# Patient Record
Sex: Female | Born: 1955 | Race: White | Hispanic: No | Marital: Single | Smoking: Former smoker
Health system: Southern US, Community
[De-identification: ages and names within clinical notes are randomized; demographics above are authoritative.]

## PROBLEM LIST (undated history)

## (undated) DIAGNOSIS — I4891 Unspecified atrial fibrillation: Secondary | ICD-10-CM

## (undated) DIAGNOSIS — N200 Calculus of kidney: Secondary | ICD-10-CM

## (undated) DIAGNOSIS — F329 Major depressive disorder, single episode, unspecified: Secondary | ICD-10-CM

## (undated) DIAGNOSIS — G35 Multiple sclerosis: Secondary | ICD-10-CM

## (undated) DIAGNOSIS — J449 Chronic obstructive pulmonary disease, unspecified: Secondary | ICD-10-CM

## (undated) DIAGNOSIS — R319 Hematuria, unspecified: Secondary | ICD-10-CM

## (undated) DIAGNOSIS — C801 Malignant (primary) neoplasm, unspecified: Secondary | ICD-10-CM

## (undated) DIAGNOSIS — I2510 Atherosclerotic heart disease of native coronary artery without angina pectoris: Secondary | ICD-10-CM

## (undated) DIAGNOSIS — J439 Emphysema, unspecified: Secondary | ICD-10-CM

## (undated) DIAGNOSIS — F32A Depression, unspecified: Secondary | ICD-10-CM

## (undated) HISTORY — DX: Hematuria, unspecified: R31.9

## (undated) HISTORY — DX: Unspecified atrial fibrillation: I48.91

---

## 2002-12-19 ENCOUNTER — Emergency Department (HOSPITAL_COMMUNITY): Admission: EM | Admit: 2002-12-19 | Discharge: 2002-12-19 | Payer: Self-pay | Admitting: Emergency Medicine

## 2003-01-23 ENCOUNTER — Encounter: Payer: Self-pay | Admitting: Neurology

## 2003-01-23 ENCOUNTER — Ambulatory Visit (HOSPITAL_COMMUNITY): Admission: RE | Admit: 2003-01-23 | Discharge: 2003-01-23 | Payer: Self-pay | Admitting: Neurology

## 2003-03-05 ENCOUNTER — Ambulatory Visit (HOSPITAL_COMMUNITY): Admission: RE | Admit: 2003-03-05 | Discharge: 2003-03-05 | Payer: Self-pay | Admitting: Neurology

## 2003-04-01 ENCOUNTER — Ambulatory Visit (HOSPITAL_COMMUNITY): Admission: RE | Admit: 2003-04-01 | Discharge: 2003-04-01 | Payer: Self-pay | Admitting: Neurology

## 2009-05-28 ENCOUNTER — Ambulatory Visit: Payer: Self-pay | Admitting: Internal Medicine

## 2009-05-28 HISTORY — PX: TOTAL HIP ARTHROPLASTY: SHX124

## 2009-05-28 HISTORY — PX: OTHER SURGICAL HISTORY: SHX169

## 2009-06-16 ENCOUNTER — Inpatient Hospital Stay: Payer: Self-pay | Admitting: Internal Medicine

## 2009-06-21 ENCOUNTER — Ambulatory Visit: Payer: Self-pay | Admitting: Cardiology

## 2009-06-22 ENCOUNTER — Ambulatory Visit: Payer: Self-pay | Admitting: Cardiology

## 2009-06-28 ENCOUNTER — Ambulatory Visit: Payer: Self-pay | Admitting: Internal Medicine

## 2009-06-28 ENCOUNTER — Encounter: Payer: Self-pay | Admitting: Internal Medicine

## 2009-06-30 ENCOUNTER — Inpatient Hospital Stay: Payer: Self-pay | Admitting: Surgery

## 2009-07-21 ENCOUNTER — Inpatient Hospital Stay: Payer: Self-pay | Admitting: Surgery

## 2009-07-26 ENCOUNTER — Ambulatory Visit: Payer: Self-pay | Admitting: Internal Medicine

## 2009-08-06 ENCOUNTER — Emergency Department: Payer: Self-pay | Admitting: Emergency Medicine

## 2009-08-07 ENCOUNTER — Ambulatory Visit: Payer: Self-pay | Admitting: Family Medicine

## 2009-08-09 ENCOUNTER — Ambulatory Visit: Payer: Self-pay | Admitting: Specialist

## 2009-09-06 ENCOUNTER — Telehealth (INDEPENDENT_AMBULATORY_CARE_PROVIDER_SITE_OTHER): Payer: Self-pay | Admitting: *Deleted

## 2009-09-06 ENCOUNTER — Inpatient Hospital Stay (HOSPITAL_COMMUNITY): Admission: EM | Admit: 2009-09-06 | Discharge: 2009-09-15 | Payer: Self-pay | Admitting: Emergency Medicine

## 2009-09-07 ENCOUNTER — Ambulatory Visit: Payer: Self-pay | Admitting: Emergency Medicine

## 2009-09-13 ENCOUNTER — Ambulatory Visit: Payer: Self-pay | Admitting: Physical Medicine & Rehabilitation

## 2010-06-27 NOTE — Progress Notes (Signed)
Summary: Brother wants patient admitted by Dr. Marina Goodell  Phone Note Other Incoming   Summary of Call: Pt.s brother Evelyn Willis called requesting Dr.Perry admit pt. to [facility] as she has had 2 falls in the past 24 hrs and continues to lose wt.Records from Dr.Smith from [facility] and Dr.Elliot reviewed by Dr.Perry and  brother needs to contact PCP for her medical issues and DR.Markham Jordan for any further GI issues. Initial call taken by: Teryl Lucy RN,  September 06, 2009 9:58 AM  Follow-up for Phone Call        though I have seen her brother, I have no professional relationship with this patient. She has a multitude of general medical problems and needs to work with her primary provider in [place]. She also has a Customer service manager. I decline the opportunity toassume this patient's care or admit this patient to [facility], hospital. Follow-up by: Hilarie Fredrickson MD,  September 06, 2009 10:18 AM

## 2010-06-28 ENCOUNTER — Ambulatory Visit: Payer: Self-pay | Admitting: Orthopedic Surgery

## 2010-06-29 ENCOUNTER — Inpatient Hospital Stay: Payer: Self-pay | Admitting: Orthopedic Surgery

## 2010-08-15 LAB — CBC
HCT: 23.9 % — ABNORMAL LOW (ref 36.0–46.0)
HCT: 25.3 % — ABNORMAL LOW (ref 36.0–46.0)
HCT: 25.3 % — ABNORMAL LOW (ref 36.0–46.0)
Hemoglobin: 7.4 g/dL — ABNORMAL LOW (ref 12.0–15.0)
Hemoglobin: 7.8 g/dL — ABNORMAL LOW (ref 12.0–15.0)
Hemoglobin: 8 g/dL — ABNORMAL LOW (ref 12.0–15.0)
Hemoglobin: 8.3 g/dL — ABNORMAL LOW (ref 12.0–15.0)
Hemoglobin: 8.5 g/dL — ABNORMAL LOW (ref 12.0–15.0)
MCHC: 32.9 g/dL (ref 30.0–36.0)
MCHC: 33.6 g/dL (ref 30.0–36.0)
MCHC: 33.6 g/dL (ref 30.0–36.0)
MCV: 91.8 fL (ref 78.0–100.0)
MCV: 92.1 fL (ref 78.0–100.0)
MCV: 93.5 fL (ref 78.0–100.0)
MCV: 94.3 fL (ref 78.0–100.0)
Platelets: 131 10*3/uL — ABNORMAL LOW (ref 150–400)
Platelets: 191 10*3/uL (ref 150–400)
Platelets: 204 10*3/uL (ref 150–400)
RBC: 2.37 MIL/uL — ABNORMAL LOW (ref 3.87–5.11)
RBC: 2.68 MIL/uL — ABNORMAL LOW (ref 3.87–5.11)
RBC: 2.75 MIL/uL — ABNORMAL LOW (ref 3.87–5.11)
RDW: 22.2 % — ABNORMAL HIGH (ref 11.5–15.5)
RDW: 22.5 % — ABNORMAL HIGH (ref 11.5–15.5)
RDW: 23.2 % — ABNORMAL HIGH (ref 11.5–15.5)
WBC: 5.7 10*3/uL (ref 4.0–10.5)
WBC: 7.1 10*3/uL (ref 4.0–10.5)
WBC: 8.6 10*3/uL (ref 4.0–10.5)
WBC: 9 10*3/uL (ref 4.0–10.5)

## 2010-08-15 LAB — BASIC METABOLIC PANEL
BUN: 1 mg/dL — ABNORMAL LOW (ref 6–23)
BUN: 11 mg/dL (ref 6–23)
BUN: 9 mg/dL (ref 6–23)
CO2: 23 mEq/L (ref 19–32)
CO2: 27 mEq/L (ref 19–32)
CO2: 32 mEq/L (ref 19–32)
Calcium: 6.1 mg/dL — CL (ref 8.4–10.5)
Calcium: 6.3 mg/dL — CL (ref 8.4–10.5)
Calcium: 6.9 mg/dL — ABNORMAL LOW (ref 8.4–10.5)
Chloride: 104 mEq/L (ref 96–112)
Chloride: 107 mEq/L (ref 96–112)
Creatinine, Ser: 0.48 mg/dL (ref 0.4–1.2)
Creatinine, Ser: 0.5 mg/dL (ref 0.4–1.2)
Creatinine, Ser: 0.52 mg/dL (ref 0.4–1.2)
Creatinine, Ser: 0.55 mg/dL (ref 0.4–1.2)
GFR calc Af Amer: >60 mL/min (ref >60)
GFR calc Af Amer: >60 mL/min (ref >60)
GFR calc non Af Amer: >60 mL/min (ref >60)
GFR calc non Af Amer: >60 mL/min (ref >60)
GFR calc non Af Amer: >60 mL/min (ref >60)
Glucose, Bld: 105 mg/dL — ABNORMAL HIGH (ref 70–99)
Glucose, Bld: 164 mg/dL — ABNORMAL HIGH (ref 70–99)
Glucose, Bld: 75 mg/dL (ref 70–99)
Potassium: 2.8 mEq/L — ABNORMAL LOW (ref 3.5–5.1)
Potassium: 4.3 mEq/L (ref 3.5–5.1)
Sodium: 136 mEq/L (ref 135–145)
Sodium: 139 mEq/L (ref 135–145)
Sodium: 141 mEq/L (ref 135–145)

## 2010-08-15 LAB — GLUCOSE, CAPILLARY
Glucose-Capillary: 101 mg/dL — ABNORMAL HIGH (ref 70–99)
Glucose-Capillary: 106 mg/dL — ABNORMAL HIGH (ref 70–99)
Glucose-Capillary: 106 mg/dL — ABNORMAL HIGH (ref 70–99)
Glucose-Capillary: 106 mg/dL — ABNORMAL HIGH (ref 70–99)
Glucose-Capillary: 112 mg/dL — ABNORMAL HIGH (ref 70–99)
Glucose-Capillary: 123 mg/dL — ABNORMAL HIGH (ref 70–99)
Glucose-Capillary: 127 mg/dL — ABNORMAL HIGH (ref 70–99)
Glucose-Capillary: 128 mg/dL — ABNORMAL HIGH (ref 70–99)
Glucose-Capillary: 145 mg/dL — ABNORMAL HIGH (ref 70–99)
Glucose-Capillary: 145 mg/dL — ABNORMAL HIGH (ref 70–99)
Glucose-Capillary: 155 mg/dL — ABNORMAL HIGH (ref 70–99)
Glucose-Capillary: 65 mg/dL — ABNORMAL LOW (ref 70–99)
Glucose-Capillary: 67 mg/dL — ABNORMAL LOW (ref 70–99)
Glucose-Capillary: 71 mg/dL (ref 70–99)
Glucose-Capillary: 73 mg/dL (ref 70–99)
Glucose-Capillary: 73 mg/dL (ref 70–99)
Glucose-Capillary: 76 mg/dL (ref 70–99)
Glucose-Capillary: 76 mg/dL (ref 70–99)
Glucose-Capillary: 77 mg/dL (ref 70–99)
Glucose-Capillary: 78 mg/dL (ref 70–99)
Glucose-Capillary: 84 mg/dL (ref 70–99)
Glucose-Capillary: 92 mg/dL (ref 70–99)

## 2010-08-15 LAB — POCT I-STAT 3, ART BLOOD GAS (G3+)
Acid-Base Excess: 3 mmol/L — ABNORMAL HIGH (ref 0.0–2.0)
Acid-Base Excess: 3 mmol/L — ABNORMAL HIGH (ref 0.0–2.0)
Bicarbonate: 24.5 mEq/L — ABNORMAL HIGH (ref 20.0–24.0)
Bicarbonate: 24.6 mEq/L — ABNORMAL HIGH (ref 20.0–24.0)
O2 Saturation: 99 %
O2 Saturation: 99 %
Patient temperature: 96.8
Patient temperature: 97
TCO2: 25 mmol/L (ref 0–100)
TCO2: 25 mmol/L (ref 0–100)
pCO2 arterial: 26.6 mmHg — ABNORMAL LOW (ref 35.0–45.0)
pCO2 arterial: 27 mmHg — ABNORMAL LOW (ref 35.0–45.0)
pH, Arterial: 7.563 — ABNORMAL HIGH (ref 7.350–7.400)
pH, Arterial: 7.572 — ABNORMAL HIGH (ref 7.350–7.400)
pO2, Arterial: 102 mmHg — ABNORMAL HIGH (ref 80.0–100.0)
pO2, Arterial: 111 mmHg — ABNORMAL HIGH (ref 80.0–100.0)

## 2010-08-15 LAB — MAGNESIUM
Magnesium: 1.6 mg/dL (ref 1.5–2.5)
Magnesium: 2 mg/dL (ref 1.5–2.5)
Magnesium: 2.3 mg/dL (ref 1.5–2.5)

## 2010-08-15 LAB — DIFFERENTIAL
Basophils Relative: 0 % (ref 0–1)
Eosinophils Relative: 0 % (ref 0–5)
Lymphocytes Relative: 12 % (ref 12–46)
Neutro Abs: 4.7 10*3/uL (ref 1.7–7.7)
Neutrophils Relative %: 82 % — ABNORMAL HIGH (ref 43–77)

## 2010-08-15 LAB — URINALYSIS, ROUTINE W REFLEX MICROSCOPIC
Hgb urine dipstick: NEGATIVE
Nitrite: NEGATIVE
Protein, ur: NEGATIVE mg/dL
Specific Gravity, Urine: 1.017 (ref 1.005–1.030)
Urobilinogen, UA: 1 mg/dL (ref 0.0–1.0)

## 2010-08-15 LAB — COMPREHENSIVE METABOLIC PANEL
BUN: 7 mg/dL (ref 6–23)
CO2: 30 mEq/L (ref 19–32)
Calcium: 7.3 mg/dL — ABNORMAL LOW (ref 8.4–10.5)
Chloride: 100 mEq/L (ref 96–112)
Creatinine, Ser: 0.54 mg/dL (ref 0.4–1.2)
GFR calc Af Amer: >60 mL/min (ref >60)
GFR calc non Af Amer: >60 mL/min (ref >60)
Glucose, Bld: 74 mg/dL (ref 70–99)
Total Bilirubin: 0.5 mg/dL (ref 0.3–1.2)

## 2010-08-15 LAB — URINE CULTURE

## 2010-08-15 LAB — CALCIUM, IONIZED: Calcium, Ion: 0.97 mmol/L — ABNORMAL LOW (ref 1.12–1.32)

## 2010-08-15 LAB — STREP PNEUMONIAE URINARY ANTIGEN: Strep Pneumo Urinary Antigen: NEGATIVE

## 2010-08-15 LAB — LEGIONELLA ANTIGEN, URINE

## 2010-08-15 LAB — PHOSPHORUS
Phosphorus: 2 mg/dL — ABNORMAL LOW (ref 2.3–4.6)
Phosphorus: 2.6 mg/dL (ref 2.3–4.6)

## 2010-08-16 LAB — URINALYSIS, ROUTINE W REFLEX MICROSCOPIC
Glucose, UA: NEGATIVE mg/dL
Hgb urine dipstick: NEGATIVE
Specific Gravity, Urine: 1.025 (ref 1.005–1.030)
pH: 5.5 (ref 5.0–8.0)

## 2010-08-16 LAB — POCT I-STAT 3, ART BLOOD GAS (G3+)
Acid-base deficit: 2 mmol/L (ref 0.0–2.0)
Acid-base deficit: 6 mmol/L — ABNORMAL HIGH (ref 0.0–2.0)
Acid-base deficit: 9 mmol/L — ABNORMAL HIGH (ref 0.0–2.0)
Bicarbonate: 13.8 mEq/L — ABNORMAL LOW (ref 20.0–24.0)
Bicarbonate: 13.9 mEq/L — ABNORMAL LOW (ref 20.0–24.0)
O2 Saturation: 100 %
O2 Saturation: 98 %
O2 Saturation: 99 %
Patient temperature: 96
Patient temperature: 98.6
Patient temperature: 99.6
TCO2: 15 mmol/L (ref 0–100)
TCO2: 18 mmol/L (ref 0–100)
pCO2 arterial: 21 mmHg — ABNORMAL LOW (ref 35.0–45.0)
pCO2 arterial: 22.3 mmHg — ABNORMAL LOW (ref 35.0–45.0)
pCO2 arterial: 23.3 mmHg — ABNORMAL LOW (ref 35.0–45.0)
pCO2 arterial: 38.8 mmHg (ref 35.0–45.0)
pH, Arterial: 7.241 — ABNORMAL LOW (ref 7.350–7.400)
pH, Arterial: 7.386 (ref 7.350–7.400)
pH, Arterial: 7.387 (ref 7.350–7.400)
pH, Arterial: 7.426 — ABNORMAL HIGH (ref 7.350–7.400)
pO2, Arterial: 115 mmHg — ABNORMAL HIGH (ref 80.0–100.0)
pO2, Arterial: 375 mmHg — ABNORMAL HIGH (ref 80.0–100.0)
pO2, Arterial: 50 mmHg — ABNORMAL LOW (ref 80.0–100.0)
pO2, Arterial: 51 mmHg — ABNORMAL LOW (ref 80.0–100.0)
pO2, Arterial: 83 mmHg (ref 80.0–100.0)

## 2010-08-16 LAB — BASIC METABOLIC PANEL
BUN: 3 mg/dL — ABNORMAL LOW (ref 6–23)
CO2: 16 mEq/L — ABNORMAL LOW (ref 19–32)
CO2: 20 mEq/L (ref 19–32)
Calcium: 6 mg/dL — CL (ref 8.4–10.5)
Chloride: 113 mEq/L — ABNORMAL HIGH (ref 96–112)
Creatinine, Ser: 0.57 mg/dL (ref 0.4–1.2)
GFR calc Af Amer: >60 mL/min (ref >60)
GFR calc non Af Amer: >60 mL/min (ref >60)
Glucose, Bld: 204 mg/dL — ABNORMAL HIGH (ref 70–99)
Sodium: 140 mEq/L (ref 135–145)

## 2010-08-16 LAB — CARBOXYHEMOGLOBIN
Methemoglobin: 0.4 % (ref 0.0–1.5)
Methemoglobin: 1 % (ref 0.0–1.5)
O2 Saturation: 58 %
O2 Saturation: 81.1 %
Total hemoglobin: 7.5 g/dL — ABNORMAL LOW (ref 12.5–16.0)
Total hemoglobin: 8.1 g/dL — ABNORMAL LOW (ref 12.5–16.0)

## 2010-08-16 LAB — COMPREHENSIVE METABOLIC PANEL
ALT: 13 U/L (ref 0–35)
ALT: 15 U/L (ref 0–35)
AST: 18 U/L (ref 0–37)
AST: 20 U/L (ref 0–37)
Alkaline Phosphatase: 120 U/L — ABNORMAL HIGH (ref 39–117)
CO2: 16 mEq/L — ABNORMAL LOW (ref 19–32)
CO2: 17 mEq/L — ABNORMAL LOW (ref 19–32)
CO2: 20 mEq/L (ref 19–32)
Calcium: 5.8 mg/dL — CL (ref 8.4–10.5)
Calcium: 6.2 mg/dL — CL (ref 8.4–10.5)
Calcium: 7.9 mg/dL — ABNORMAL LOW (ref 8.4–10.5)
Chloride: 101 mEq/L (ref 96–112)
Chloride: 114 mEq/L — ABNORMAL HIGH (ref 96–112)
Creatinine, Ser: 0.43 mg/dL (ref 0.4–1.2)
Creatinine, Ser: 0.61 mg/dL (ref 0.4–1.2)
GFR calc Af Amer: >60 mL/min (ref >60)
GFR calc non Af Amer: >60 mL/min (ref >60)
GFR calc non Af Amer: >60 mL/min (ref >60)
GFR calc non Af Amer: >60 mL/min (ref >60)
Glucose, Bld: 66 mg/dL — ABNORMAL LOW (ref 70–99)
Glucose, Bld: 79 mg/dL (ref 70–99)
Potassium: 3.2 mEq/L — ABNORMAL LOW (ref 3.5–5.1)
Sodium: 135 mEq/L (ref 135–145)
Total Bilirubin: 1.3 mg/dL — ABNORMAL HIGH (ref 0.3–1.2)
Total Bilirubin: 1.7 mg/dL — ABNORMAL HIGH (ref 0.3–1.2)
Total Protein: 3.9 g/dL — ABNORMAL LOW (ref 6.0–8.3)

## 2010-08-16 LAB — CBC
Hemoglobin: 7.5 g/dL — ABNORMAL LOW (ref 12.0–15.0)
Hemoglobin: 9.4 g/dL — ABNORMAL LOW (ref 12.0–15.0)
MCHC: 32.8 g/dL (ref 30.0–36.0)
MCHC: 33 g/dL (ref 30.0–36.0)
MCV: 91.3 fL (ref 78.0–100.0)
MCV: 92.4 fL (ref 78.0–100.0)
MCV: 92.4 fL (ref 78.0–100.0)
Platelets: 215 10*3/uL (ref 150–400)
RBC: 2.51 MIL/uL — ABNORMAL LOW (ref 3.87–5.11)
RBC: 3.07 MIL/uL — ABNORMAL LOW (ref 3.87–5.11)
RDW: 23.2 % — ABNORMAL HIGH (ref 11.5–15.5)
WBC: 6.4 10*3/uL (ref 4.0–10.5)
WBC: 8.1 10*3/uL (ref 4.0–10.5)

## 2010-08-16 LAB — CULTURE, BLOOD (ROUTINE X 2): Culture: NO GROWTH

## 2010-08-16 LAB — PHOSPHORUS
Phosphorus: 2 mg/dL — ABNORMAL LOW (ref 2.3–4.6)
Phosphorus: 2.2 mg/dL — ABNORMAL LOW (ref 2.3–4.6)

## 2010-08-16 LAB — CULTURE, BAL-QUANTITATIVE W GRAM STAIN
Colony Count: NO GROWTH
Culture: NO GROWTH

## 2010-08-16 LAB — ABO/RH: ABO/RH(D): O POS

## 2010-08-16 LAB — TYPE AND SCREEN: ABO/RH(D): O POS

## 2010-08-16 LAB — PROTIME-INR: Prothrombin Time: 16 seconds — ABNORMAL HIGH (ref 11.6–15.2)

## 2010-08-16 LAB — CARDIAC PANEL(CRET KIN+CKTOT+MB+TROPI)
CK, MB: 2.1 ng/mL (ref 0.3–4.0)
Total CK: 20 U/L (ref 7–177)
Troponin I: 0.06 ng/mL (ref 0.00–0.06)
Troponin I: 0.06 ng/mL (ref 0.00–0.06)

## 2010-08-16 LAB — IRON AND TIBC

## 2010-08-16 LAB — DIFFERENTIAL
Basophils Absolute: 0 10*3/uL (ref 0.0–0.1)
Basophils Absolute: 0 10*3/uL (ref 0.0–0.1)
Eosinophils Absolute: 0 10*3/uL (ref 0.0–0.7)
Eosinophils Relative: 0 % (ref 0–5)
Lymphocytes Relative: 10 % — ABNORMAL LOW (ref 12–46)
Lymphs Abs: 0.6 10*3/uL — ABNORMAL LOW (ref 0.7–4.0)
Lymphs Abs: 1.2 10*3/uL (ref 0.7–4.0)
Monocytes Absolute: 0.8 10*3/uL (ref 0.1–1.0)
Monocytes Relative: 10 % (ref 3–12)
Monocytes Relative: 10 % (ref 3–12)
Neutrophils Relative %: 75 % (ref 43–77)

## 2010-08-16 LAB — MAGNESIUM: Magnesium: 1.4 mg/dL — ABNORMAL LOW (ref 1.5–2.5)

## 2010-08-16 LAB — GLUCOSE, CAPILLARY
Glucose-Capillary: 130 mg/dL — ABNORMAL HIGH (ref 70–99)
Glucose-Capillary: 86 mg/dL (ref 70–99)
Glucose-Capillary: 97 mg/dL (ref 70–99)

## 2010-08-16 LAB — FERRITIN: Ferritin: 618 ng/mL — ABNORMAL HIGH (ref 10–291)

## 2010-08-16 LAB — CK TOTAL AND CKMB (NOT AT ARMC): CK, MB: 1.8 ng/mL (ref 0.3–4.0)

## 2010-08-16 LAB — PREALBUMIN: Prealbumin: 3.4 mg/dL — ABNORMAL LOW (ref 18.0–45.0)

## 2010-08-16 LAB — LIPASE, BLOOD: Lipase: 12 U/L (ref 11–59)

## 2010-08-16 LAB — TSH: TSH: 0.216 u[IU]/mL — ABNORMAL LOW (ref 0.350–4.500)

## 2010-08-16 LAB — MRSA PCR SCREENING: MRSA by PCR: POSITIVE — AB

## 2010-08-16 LAB — RETICULOCYTES: Retic Ct Pct: 1.7 % (ref 0.4–3.1)

## 2010-08-16 LAB — FOLATE: Folate: 5.1 ng/mL

## 2010-10-25 ENCOUNTER — Ambulatory Visit: Payer: Self-pay | Admitting: Orthopedic Surgery

## 2012-09-12 DIAGNOSIS — G8929 Other chronic pain: Secondary | ICD-10-CM | POA: Insufficient documentation

## 2013-04-29 DIAGNOSIS — G894 Chronic pain syndrome: Secondary | ICD-10-CM | POA: Insufficient documentation

## 2013-04-29 DIAGNOSIS — M2550 Pain in unspecified joint: Secondary | ICD-10-CM | POA: Insufficient documentation

## 2013-04-29 DIAGNOSIS — Z79899 Other long term (current) drug therapy: Secondary | ICD-10-CM | POA: Insufficient documentation

## 2014-04-27 DIAGNOSIS — G4700 Insomnia, unspecified: Secondary | ICD-10-CM | POA: Insufficient documentation

## 2014-04-27 DIAGNOSIS — R5382 Chronic fatigue, unspecified: Secondary | ICD-10-CM | POA: Insufficient documentation

## 2014-05-28 DIAGNOSIS — C801 Malignant (primary) neoplasm, unspecified: Secondary | ICD-10-CM

## 2014-05-28 HISTORY — DX: Malignant (primary) neoplasm, unspecified: C80.1

## 2014-12-21 ENCOUNTER — Emergency Department
Admission: EM | Admit: 2014-12-21 | Discharge: 2014-12-21 | Discharge status: Against Medical Advice | Payer: PPO | Attending: Emergency Medicine | Admitting: Emergency Medicine

## 2014-12-21 ENCOUNTER — Emergency Department: Payer: PPO

## 2014-12-21 DIAGNOSIS — R05 Cough: Secondary | ICD-10-CM | POA: Insufficient documentation

## 2014-12-21 LAB — BASIC METABOLIC PANEL
ANION GAP: 10 (ref 5–15)
BUN: 18 mg/dL (ref 6–20)
CHLORIDE: 105 mmol/L (ref 101–111)
CO2: 24 mmol/L (ref 22–32)
CREATININE: 0.6 mg/dL (ref 0.44–1.00)
Calcium: 8.9 mg/dL (ref 8.9–10.3)
GFR calc Af Amer: >60 mL/min (ref >60)
Glucose, Bld: 121 mg/dL — ABNORMAL HIGH (ref 65–99)
Potassium: 4.6 mmol/L (ref 3.5–5.1)
Sodium: 139 mmol/L (ref 135–145)

## 2014-12-21 LAB — CBC
HCT: 42.8 % (ref 35.0–47.0)
HEMOGLOBIN: 14.3 g/dL (ref 12.0–16.0)
MCH: 32.1 pg (ref 26.0–34.0)
MCHC: 33.4 g/dL (ref 32.0–36.0)
MCV: 96.2 fL (ref 80.0–100.0)
Platelets: 237 10*3/uL (ref 150–440)
RBC: 4.45 MIL/uL (ref 3.80–5.20)
RDW: 15.4 % — AB (ref 11.5–14.5)
WBC: 3.6 10*3/uL (ref 3.6–11.0)

## 2014-12-21 NOTE — ED Notes (Addendum)
Patient ambulatory to triage with steady gait, without difficulty or distress noted; pt reports episode of hemoptysis tonight with no accomp symptoms; pt currently receiving chemo & radiation for lung CA; mask placed on pt

## 2015-05-19 ENCOUNTER — Emergency Department: Payer: PPO

## 2015-05-19 ENCOUNTER — Emergency Department
Admission: EM | Admit: 2015-05-19 | Discharge: 2015-05-19 | Disposition: A | Discharge status: Home / Self Care | Payer: PPO | Attending: Emergency Medicine | Admitting: Emergency Medicine

## 2015-05-19 DIAGNOSIS — R079 Chest pain, unspecified: Secondary | ICD-10-CM | POA: Insufficient documentation

## 2015-05-19 DIAGNOSIS — R9389 Abnormal findings on diagnostic imaging of other specified body structures: Secondary | ICD-10-CM

## 2015-05-19 DIAGNOSIS — R531 Weakness: Secondary | ICD-10-CM | POA: Insufficient documentation

## 2015-05-19 DIAGNOSIS — R918 Other nonspecific abnormal finding of lung field: Secondary | ICD-10-CM | POA: Diagnosis not present

## 2015-05-19 DIAGNOSIS — R0602 Shortness of breath: Secondary | ICD-10-CM | POA: Insufficient documentation

## 2015-05-19 DIAGNOSIS — C3491 Malignant neoplasm of unspecified part of right bronchus or lung: Secondary | ICD-10-CM | POA: Insufficient documentation

## 2015-05-19 DIAGNOSIS — F17200 Nicotine dependence, unspecified, uncomplicated: Secondary | ICD-10-CM | POA: Diagnosis not present

## 2015-05-19 HISTORY — DX: Malignant (primary) neoplasm, unspecified: C80.1

## 2015-05-19 LAB — CBC
HCT: 41.5 % (ref 35.0–47.0)
Hemoglobin: 14 g/dL (ref 12.0–16.0)
MCH: 34.6 pg — AB (ref 26.0–34.0)
MCHC: 33.7 g/dL (ref 32.0–36.0)
MCV: 102.6 fL — AB (ref 80.0–100.0)
PLATELETS: 303 10*3/uL (ref 150–440)
RBC: 4.05 MIL/uL (ref 3.80–5.20)
RDW: 12.1 % (ref 11.5–14.5)
WBC: 9.8 10*3/uL (ref 3.6–11.0)

## 2015-05-19 LAB — TROPONIN I
Troponin I: <0.03 ng/mL (ref <0.031)
Troponin I: <0.03 ng/mL (ref <0.031)

## 2015-05-19 LAB — BASIC METABOLIC PANEL
Anion gap: 8 (ref 5–15)
BUN: 17 mg/dL (ref 6–20)
CHLORIDE: 100 mmol/L — AB (ref 101–111)
CO2: 28 mmol/L (ref 22–32)
CREATININE: 0.7 mg/dL (ref 0.44–1.00)
Calcium: 9 mg/dL (ref 8.9–10.3)
GFR calc Af Amer: >60 mL/min (ref >60)
GFR calc non Af Amer: >60 mL/min (ref >60)
Glucose, Bld: 112 mg/dL — ABNORMAL HIGH (ref 65–99)
Potassium: 4.7 mmol/L (ref 3.5–5.1)
SODIUM: 136 mmol/L (ref 135–145)

## 2015-05-19 MED ORDER — AMOXICILLIN-POT CLAVULANATE 875-125 MG PO TABS
1.0000 | ORAL_TABLET | Freq: Once | ORAL | Status: AC
  Administered 2015-05-19: 1 via ORAL
  Filled 2015-05-19: qty 1

## 2015-05-19 MED ORDER — AMOXICILLIN-POT CLAVULANATE 875-125 MG PO TABS: 1.0000 | ORAL_TABLET | Freq: Two times a day (BID) | ORAL | Status: DC

## 2015-05-19 NOTE — Discharge Instructions (Signed)
It is not clear what caused your chest pain and episode earlier today. Your looking well the emergency department. Your first cardiac enzyme was negative. We discussed your chest x-ray and viewed it together. We agreed that repeating her CT scan was not appropriate today. We will treat her with antibiotics in case the area in question on the chest x-ray is pneumonia as opposed to scarring. Follow-up with your primary physician. Return to the emergency department if you have more chest pain or shortness of breath or other urgent concerns.  Nonspecific Chest Pain It is often hard to find the cause of chest pain. There is always a chance that your pain could be related to something serious, such as a heart attack or a blood clot in your lungs. Chest pain can also be caused by conditions that are not life-threatening. If you have chest pain, it is very important to follow up with your doctor.  HOME CARE  If you were prescribed an antibiotic medicine, finish it all even if you start to feel better.  Avoid any activities that cause chest pain.  Do not use any tobacco products, including cigarettes, chewing tobacco, or electronic cigarettes. If you need help quitting, ask your doctor.  Do not drink alcohol.  Take medicines only as told by your doctor.  Keep all follow-up visits as told by your doctor. This is important. This includes any further testing if your chest pain does not go away.  Your doctor may tell you to keep your head raised (elevated) while you sleep.  Make lifestyle changes as told by your doctor. These may include:  Getting regular exercise. Ask your doctor to suggest some activities that are safe for you.  Eating a heart-healthy diet. Your doctor or a diet specialist (dietitian) can help you to learn healthy eating options.  Maintaining a healthy weight.  Managing diabetes, if necessary.  Reducing stress. GET HELP IF:  Your chest pain does not go away, even after  treatment.  You have a rash with blisters on your chest.  You have a fever. GET HELP RIGHT AWAY IF:  Your chest pain is worse.  You have an increasing cough, or you cough up blood.  You have severe belly (abdominal) pain.  You feel extremely weak.  You pass out (faint).  You have chills.  You have sudden, unexplained chest discomfort.  You have sudden, unexplained discomfort in your arms, back, neck, or jaw.  You have shortness of breath at any time.  You suddenly start to sweat, or your skin gets clammy.  You feel nauseous.  You vomit.  You suddenly feel light-headed or dizzy.  Your heart begins to beat quickly, or it feels like it is skipping beats. These symptoms may be an emergency. Do not wait to see if the symptoms will go away. Get medical help right away. Call your local emergency services (911 in the [place] Do not drive yourself to the hospital.   This information is not intended to replace advice given to you by your health care provider. Make sure you discuss any questions you have with your health care provider.   Document Released: 10/31/2007 Document Revised: 06/04/2014 Document Reviewed: 12/18/2013 Elsevier Interactive Patient Education [employer].

## 2015-05-19 NOTE — ED Notes (Signed)
CP intermittently X 2 days. Pain earlier today. Pt alert and oriented X4, active, cooperative, pt in NAD. RR even and unlabored, color WNL.

## 2015-05-19 NOTE — ED Provider Notes (Addendum)
[facility] Emergency Department Provider Note  ____________________________________________  Time seen: 1353  I have reviewed the triage vital signs and the nursing notes.  History by:    HISTORY  Chief Complaint Chest Pain     HPI Evelyn Willis is a 59 y.o. female who was diagnosed with a lung cancer earlier this year. She was treated at [facility] with chemotherapy. She had a follow-up CT scan on November 30 and was cleared. She has been doing well until today when she had an episode of chest pain, shortness of breath, and feeling a little weak. Apparently she turned pale at this episode. This episode began at 12:00 and lasted approximately 10 minutes. She came to the emergency department. She is feeling better at this time. She is concerned about recurrence of cancer versus pneumonia. She denies any fever. She is not currently having any significant shortness of breath.    Past Medical History  Diagnosis Date  . Cancer ([place])     There are no active problems to display for this patient.   History reviewed. No pertinent past surgical history.  Current Outpatient Rx  Name  Route  Sig  Dispense  Refill  . amoxicillin-clavulanate (AUGMENTIN) 875-125 MG tablet   Oral   Take 1 tablet by mouth 2 (two) times daily.   15 tablet   0     Allergies Review of patient's allergies indicates no known allergies.  No family history on file.  Social History Social History  Substance Use Topics  . Smoking status: Current Every Day Smoker  . Smokeless tobacco: None  . Alcohol Use: Yes    Review of Systems  Constitutional: Negative for fever/chills. ENT: Negative for congestion. Cardiovascular: Chest pain. See history of present illness Respiratory: Shortness of breath. Recent diagnosis and treatment for lung cancer.. Gastrointestinal: Negative for abdominal pain, vomiting and diarrhea. Genitourinary: Negative for  dysuria. Musculoskeletal: No back pain. Skin: Negative for rash. Neurological: Negative for headache or focal weakness   10-point ROS otherwise negative.  ____________________________________________   PHYSICAL EXAM:  VITAL SIGNS: ED Triage Vitals  Enc Vitals Group     BP 05/19/15 1232 125/75 mmHg     Pulse Rate 05/19/15 1232 94     Resp 05/19/15 1232 18     Temp 05/19/15 1232 98.7 F (37.1 C)     Temp Source 05/19/15 1232 Oral     SpO2 05/19/15 1232 93 %     Weight 05/19/15 1226 128 lb (58.06 kg)     Height 05/19/15 1226 '5\' 7"'$  (1.702 m)     Head Cir --      Peak Flow --      Pain Score 05/19/15 1226 2     Pain Loc --      Pain Edu? --      Excl. in [place]? --     Constitutional:  Alert and oriented. Well appearing and in no distress. ENT   Head: Normocephalic and atraumatic.   Nose: No congestion/rhinnorhea.       Mouth: No erythema, no swelling   Cardiovascular: Normal rate, regular rhythm, no murmur noted Respiratory:  Normal respiratory effort, no tachypnea.    Breath sounds are clear and equal bilaterally.  Gastrointestinal: Soft, no distention. Nontender Back: No muscle spasm, no tenderness, no CVA tenderness. Musculoskeletal: No deformity noted. Nontender with normal range of motion in all extremities.  No noted edema. Neurologic:  Communicative. Normal appearing spontaneous movement in all 4 extremities.  No gross focal neurologic deficits are appreciated.  Skin:  Skin is warm, dry. No rash noted. Psychiatric: Mood and affect are normal. Speech and behavior are normal.  ____________________________________________    LABS (pertinent positives/negatives)  Labs Reviewed  BASIC METABOLIC PANEL - Abnormal; Notable for the following:    Chloride 100 (*)    Glucose, Bld 112 (*)    All other components within normal limits  CBC - Abnormal; Notable for the following:    MCV 102.6 (*)    MCH 34.6 (*)    All other components within normal limits  TROPONIN I   TROPONIN I     ____________________________________________   EKG  ED ECG REPORT I, Haruko Mersch W, the attending physician, personally viewed and interpreted this ECG.   Date: 05/19/2015  EKG Time: 12:30  Rate: 102  Rhythm: Sinus tachycardia  Axis: Normal  Intervals: Normal  ST&T Change: Somewhat tall T waves in 2, 3, aVF and V4 through V6   ____________________________________________    RADIOLOGY  Chest x-ray: IMPRESSION: Left lower lobe airspace disease worrisome for pneumonia. Recommend followup to clearing.  Emphysema.  Atherosclerosis.  ____________________________________________   PROCEDURES    ____________________________________________   INITIAL IMPRESSION / ASSESSMENT AND PLAN / ED COURSE  Pertinent labs & imaging results that were available during my care of the patient were reviewed by me and considered in my medical decision making (see chart for details).  Alert well-appearing 59-year-old female in no acute distress. She is understandably anxious because of her recent history and her episode today, but she does appear better and feels comfortable at this time.  I have pulled up her x-ray and shown it to her and discussed the area in the left lower lobe that is questionable. I have also pulled upper CT report from April 27 2015, which showed no worrisome neoplastic process.  My suspicion is that the patient has some scarring that still shows up on chest x-ray and she does not have pneumonia or current neoplasm. The patient and I discussed getting another CT, but given the fact that she just had one 3 weeks ago and that a CT this day would be unlikely to alter her clinical path, she has agreed that one is not indicated. We will place her on antibiotics. She is concerned because the chest pain she had. Because of this chest pain, we will hold her in the emergency department for serial enzymes. A second troponin will be drawn. I expect it to be  normal. If it is, we'll discharge her home to follow-up with her primary physician.  I'll last my colleague, Dr. Kerman Passey, to follow-up on the troponin and release the patient if it is negative.  ____________________________________________   FINAL CLINICAL IMPRESSION(S) / ED DIAGNOSES  Final diagnoses:  Chest pain, unspecified chest pain type  Abnormal chest x-ray     Ahmed Prima, MD 05/19/15 1424

## 2015-05-19 NOTE — ED Provider Notes (Signed)
-----------------------------------------   4:27 PM on 05/19/2015 -----------------------------------------  Second troponin is negative. We'll discharge the patient home per Dr. Ronni Rumble discharge instructions and prescriptions.  Harvest Dark, MD 05/19/15 [id number]

## 2015-05-31 DIAGNOSIS — Z0289 Encounter for other administrative examinations: Secondary | ICD-10-CM | POA: Insufficient documentation

## 2015-07-19 ENCOUNTER — Other Ambulatory Visit: Payer: Self-pay | Admitting: Family Medicine

## 2015-07-19 DIAGNOSIS — J449 Chronic obstructive pulmonary disease, unspecified: Secondary | ICD-10-CM | POA: Insufficient documentation

## 2015-07-19 DIAGNOSIS — Z1231 Encounter for screening mammogram for malignant neoplasm of breast: Secondary | ICD-10-CM

## 2015-07-27 ENCOUNTER — Ambulatory Visit
Admission: RE | Admit: 2015-07-27 | Discharge: 2015-07-27 | Disposition: A | Discharge status: Home / Self Care | Payer: PPO | Source: Ambulatory Visit | Attending: Family Medicine | Admitting: Family Medicine

## 2015-07-27 ENCOUNTER — Other Ambulatory Visit: Payer: Self-pay | Admitting: Family Medicine

## 2015-07-27 DIAGNOSIS — Z1231 Encounter for screening mammogram for malignant neoplasm of breast: Secondary | ICD-10-CM

## 2015-08-06 ENCOUNTER — Emergency Department: Payer: PPO

## 2015-08-06 ENCOUNTER — Inpatient Hospital Stay
Admission: EM | Admit: 2015-08-06 | Discharge: 2015-08-07 | DRG: 310 | Disposition: A | Discharge status: Home / Self Care | Payer: PPO | Attending: Internal Medicine | Admitting: Internal Medicine

## 2015-08-06 ENCOUNTER — Encounter: Payer: Self-pay | Admitting: Urgent Care

## 2015-08-06 DIAGNOSIS — J432 Centrilobular emphysema: Secondary | ICD-10-CM | POA: Insufficient documentation

## 2015-08-06 DIAGNOSIS — G35A Relapsing-remitting multiple sclerosis: Secondary | ICD-10-CM | POA: Insufficient documentation

## 2015-08-06 DIAGNOSIS — G35 Multiple sclerosis: Secondary | ICD-10-CM | POA: Diagnosis present

## 2015-08-06 DIAGNOSIS — J449 Chronic obstructive pulmonary disease, unspecified: Secondary | ICD-10-CM | POA: Diagnosis present

## 2015-08-06 DIAGNOSIS — R000 Tachycardia, unspecified: Secondary | ICD-10-CM | POA: Insufficient documentation

## 2015-08-06 DIAGNOSIS — I4891 Unspecified atrial fibrillation: Secondary | ICD-10-CM | POA: Diagnosis present

## 2015-08-06 DIAGNOSIS — I2510 Atherosclerotic heart disease of native coronary artery without angina pectoris: Secondary | ICD-10-CM | POA: Diagnosis present

## 2015-08-06 DIAGNOSIS — Z79899 Other long term (current) drug therapy: Secondary | ICD-10-CM | POA: Diagnosis not present

## 2015-08-06 DIAGNOSIS — Z716 Tobacco abuse counseling: Secondary | ICD-10-CM

## 2015-08-06 DIAGNOSIS — F329 Major depressive disorder, single episode, unspecified: Secondary | ICD-10-CM | POA: Diagnosis present

## 2015-08-06 DIAGNOSIS — Z7189 Other specified counseling: Secondary | ICD-10-CM | POA: Insufficient documentation

## 2015-08-06 DIAGNOSIS — Z955 Presence of coronary angioplasty implant and graft: Secondary | ICD-10-CM

## 2015-08-06 DIAGNOSIS — F32A Depression, unspecified: Secondary | ICD-10-CM | POA: Insufficient documentation

## 2015-08-06 DIAGNOSIS — C3402 Malignant neoplasm of left main bronchus: Secondary | ICD-10-CM | POA: Insufficient documentation

## 2015-08-06 DIAGNOSIS — Z8249 Family history of ischemic heart disease and other diseases of the circulatory system: Secondary | ICD-10-CM | POA: Diagnosis not present

## 2015-08-06 DIAGNOSIS — Z886 Allergy status to analgesic agent status: Secondary | ICD-10-CM

## 2015-08-06 DIAGNOSIS — R0600 Dyspnea, unspecified: Secondary | ICD-10-CM | POA: Insufficient documentation

## 2015-08-06 DIAGNOSIS — Z85118 Personal history of other malignant neoplasm of bronchus and lung: Secondary | ICD-10-CM | POA: Diagnosis not present

## 2015-08-06 DIAGNOSIS — I498 Other specified cardiac arrhythmias: Secondary | ICD-10-CM

## 2015-08-06 DIAGNOSIS — C3490 Malignant neoplasm of unspecified part of unspecified bronchus or lung: Secondary | ICD-10-CM | POA: Diagnosis not present

## 2015-08-06 DIAGNOSIS — I480 Paroxysmal atrial fibrillation: Secondary | ICD-10-CM | POA: Diagnosis not present

## 2015-08-06 DIAGNOSIS — Z7951 Long term (current) use of inhaled steroids: Secondary | ICD-10-CM | POA: Diagnosis not present

## 2015-08-06 DIAGNOSIS — F17200 Nicotine dependence, unspecified, uncomplicated: Secondary | ICD-10-CM | POA: Insufficient documentation

## 2015-08-06 HISTORY — DX: Major depressive disorder, single episode, unspecified: F32.9

## 2015-08-06 HISTORY — DX: Depression, unspecified: F32.A

## 2015-08-06 HISTORY — DX: Multiple sclerosis: G35

## 2015-08-06 HISTORY — DX: Atherosclerotic heart disease of native coronary artery without angina pectoris: I25.10

## 2015-08-06 LAB — URINALYSIS COMPLETE WITH MICROSCOPIC (ARMC ONLY)
BACTERIA UA: NONE SEEN
Bilirubin Urine: NEGATIVE
Glucose, UA: NEGATIVE mg/dL
Ketones, ur: NEGATIVE mg/dL
Nitrite: NEGATIVE
PROTEIN: NEGATIVE mg/dL
Specific Gravity, Urine: 1.014 (ref 1.005–1.030)
pH: 5 (ref 5.0–8.0)

## 2015-08-06 LAB — CBC
HCT: 47.3 % — ABNORMAL HIGH (ref 35.0–47.0)
Hemoglobin: 15.9 g/dL (ref 12.0–16.0)
MCH: 32.8 pg (ref 26.0–34.0)
MCHC: 33.7 g/dL (ref 32.0–36.0)
MCV: 97.3 fL (ref 80.0–100.0)
PLATELETS: 303 10*3/uL (ref 150–440)
RBC: 4.87 MIL/uL (ref 3.80–5.20)
RDW: 15.7 % — ABNORMAL HIGH (ref 11.5–14.5)
WBC: 7.8 10*3/uL (ref 3.6–11.0)

## 2015-08-06 LAB — TROPONIN I
TROPONIN I: 0.04 ng/mL — AB (ref <0.031)
TROPONIN I: 0.04 ng/mL — AB (ref <0.031)
TROPONIN I: 0.03 ng/mL (ref <0.031)
TROPONIN I: 0.04 ng/mL — AB (ref <0.031)

## 2015-08-06 LAB — TSH: TSH: 1.31 u[IU]/mL (ref 0.350–4.500)

## 2015-08-06 LAB — HEPARIN LEVEL (UNFRACTIONATED)
Heparin Unfractionated: 0.27 IU/mL — ABNORMAL LOW (ref 0.30–0.70)
Heparin Unfractionated: 0.39 IU/mL (ref 0.30–0.70)

## 2015-08-06 LAB — COMPREHENSIVE METABOLIC PANEL
ALK PHOS: 93 U/L (ref 38–126)
ALT: 17 U/L (ref 14–54)
ANION GAP: 16 — AB (ref 5–15)
AST: 23 U/L (ref 15–41)
Albumin: 3.8 g/dL (ref 3.5–5.0)
BUN: 14 mg/dL (ref 6–20)
CALCIUM: 9.6 mg/dL (ref 8.9–10.3)
CO2: 20 mmol/L — AB (ref 22–32)
Chloride: 108 mmol/L (ref 101–111)
Creatinine, Ser: 0.58 mg/dL (ref 0.44–1.00)
GFR calc non Af Amer: >60 mL/min (ref >60)
Glucose, Bld: 109 mg/dL — ABNORMAL HIGH (ref 65–99)
Potassium: 4.2 mmol/L (ref 3.5–5.1)
SODIUM: 144 mmol/L (ref 135–145)
Total Bilirubin: 0.4 mg/dL (ref 0.3–1.2)
Total Protein: 7.8 g/dL (ref 6.5–8.1)

## 2015-08-06 LAB — PROTIME-INR
INR: 0.97
Prothrombin Time: 13.1 seconds (ref 11.4–15.0)

## 2015-08-06 LAB — APTT: APTT: 35 s (ref 24–36)

## 2015-08-06 LAB — MAGNESIUM: MAGNESIUM: 1.8 mg/dL (ref 1.7–2.4)

## 2015-08-06 LAB — T4, FREE: Free T4: 0.8 ng/dL (ref 0.61–1.12)

## 2015-08-06 MED ORDER — ALPRAZOLAM 1 MG PO TABS
1.0000 mg | ORAL_TABLET | Freq: Every day | ORAL | Status: DC
  Administered 2015-08-06 – 2015-08-07 (×2): 1 mg via ORAL
  Filled 2015-08-06 (×3): qty 1

## 2015-08-06 MED ORDER — ALBUTEROL SULFATE (2.5 MG/3ML) 0.083% IN NEBU: 2.5000 mg | INHALATION_SOLUTION | RESPIRATORY_TRACT | Status: DC | PRN

## 2015-08-06 MED ORDER — NICOTINE 14 MG/24HR TD PT24
14.0000 mg | MEDICATED_PATCH | Freq: Every day | TRANSDERMAL | Status: DC
  Administered 2015-08-06: 14 mg via TRANSDERMAL
  Filled 2015-08-06: qty 1

## 2015-08-06 MED ORDER — BUPROPION HCL ER (XL) 150 MG PO TB24
150.0000 mg | ORAL_TABLET | Freq: Every day | ORAL | Status: DC
  Administered 2015-08-06 – 2015-08-07 (×2): 150 mg via ORAL
  Filled 2015-08-06 (×2): qty 1

## 2015-08-06 MED ORDER — HYDROCODONE-ACETAMINOPHEN 10-325 MG PO TABS
1.0000 | ORAL_TABLET | ORAL | Status: DC | PRN
  Administered 2015-08-06 – 2015-08-07 (×2): 1 via ORAL
  Filled 2015-08-06 (×2): qty 1

## 2015-08-06 MED ORDER — AMITRIPTYLINE HCL 25 MG PO TABS
75.0000 mg | ORAL_TABLET | Freq: Every day | ORAL | Status: DC
  Administered 2015-08-06: 75 mg via ORAL
  Filled 2015-08-06: qty 3

## 2015-08-06 MED ORDER — HEPARIN BOLUS VIA INFUSION
3000.0000 [IU] | Freq: Once | INTRAVENOUS | Status: AC
  Administered 2015-08-06: 3000 [IU] via INTRAVENOUS
  Filled 2015-08-06: qty 3000

## 2015-08-06 MED ORDER — HEPARIN SODIUM (PORCINE) 5000 UNIT/ML IJ SOLN: 5000.0000 [IU] | Freq: Three times a day (TID) | INTRAMUSCULAR | Status: DC

## 2015-08-06 MED ORDER — MIRTAZAPINE 15 MG PO TABS
15.0000 mg | ORAL_TABLET | Freq: Every day | ORAL | Status: DC
  Administered 2015-08-06: 15 mg via ORAL
  Filled 2015-08-06: qty 1

## 2015-08-06 MED ORDER — HEPARIN (PORCINE) IN NACL 100-0.45 UNIT/ML-% IJ SOLN
15.0000 [IU]/kg/h | INTRAMUSCULAR | Status: DC
  Filled 2015-08-06: qty 250

## 2015-08-06 MED ORDER — ALBUTEROL SULFATE HFA 108 (90 BASE) MCG/ACT IN AERS: 2.0000 | INHALATION_SPRAY | RESPIRATORY_TRACT | Status: DC | PRN

## 2015-08-06 MED ORDER — DEXTROSE 5 % IV SOLN: 60.0000 mg/h | Freq: Once | INTRAVENOUS | Status: DC

## 2015-08-06 MED ORDER — TIOTROPIUM BROMIDE MONOHYDRATE 18 MCG IN CAPS
1.0000 | ORAL_CAPSULE | Freq: Every day | RESPIRATORY_TRACT | Status: DC
  Administered 2015-08-06 – 2015-08-07 (×2): 18 ug via RESPIRATORY_TRACT
  Filled 2015-08-06: qty 5

## 2015-08-06 MED ORDER — SODIUM CHLORIDE 0.9% FLUSH
3.0000 mL | Freq: Two times a day (BID) | INTRAVENOUS | Status: DC
  Administered 2015-08-07: 3 mL via INTRAVENOUS

## 2015-08-06 MED ORDER — SODIUM CHLORIDE 0.9 % IV BOLUS (SEPSIS)
500.0000 mL | INTRAVENOUS | Status: AC
  Administered 2015-08-06: 500 mL via INTRAVENOUS

## 2015-08-06 MED ORDER — HEPARIN (PORCINE) IN NACL 100-0.45 UNIT/ML-% IJ SOLN
900.0000 [IU]/h | INTRAMUSCULAR | Status: DC
  Administered 2015-08-06: 800 [IU]/h via INTRAVENOUS
  Administered 2015-08-07: 900 [IU]/h via INTRAVENOUS
  Filled 2015-08-06 (×3): qty 250

## 2015-08-06 MED ORDER — AMIODARONE HCL IN DEXTROSE 360-4.14 MG/200ML-% IV SOLN
30.0000 mg/h | INTRAVENOUS | Status: DC
  Administered 2015-08-06 (×2): 30 mg/h via INTRAVENOUS
  Filled 2015-08-06 (×3): qty 200

## 2015-08-06 MED ORDER — AMIODARONE HCL IN DEXTROSE 360-4.14 MG/200ML-% IV SOLN
60.0000 mg/h | INTRAVENOUS | Status: AC
  Administered 2015-08-06: 60 mg/h via INTRAVENOUS

## 2015-08-06 MED ORDER — MORPHINE SULFATE ER 15 MG PO TBCR
15.0000 mg | EXTENDED_RELEASE_TABLET | Freq: Two times a day (BID) | ORAL | Status: DC
  Administered 2015-08-06 – 2015-08-07 (×3): 15 mg via ORAL
  Filled 2015-08-06 (×3): qty 1

## 2015-08-06 MED ORDER — HEPARIN BOLUS VIA INFUSION
800.0000 [IU] | Freq: Once | INTRAVENOUS | Status: AC
Start: 2015-08-06 — End: 2015-08-06
  Administered 2015-08-06: 800 [IU] via INTRAVENOUS
  Filled 2015-08-06: qty 800

## 2015-08-06 MED ORDER — AMIODARONE IV BOLUS ONLY 150 MG/100ML
150.0000 mg | Freq: Once | INTRAVENOUS | Status: AC
  Administered 2015-08-06: 150 mg via INTRAVENOUS

## 2015-08-06 NOTE — H&P (Signed)
[facility] [place] NAME: Evelyn Willis    MR#:  [id number]  DATE OF BIRTH:  [birth year]  DATE OF ADMISSION:  08/06/2015  PRIMARY CARE PHYSICIAN: BABAOFF, Caryl Bis, MD   REQUESTING/REFERRING PHYSICIAN: forbach  CHIEF COMPLAINT:   Chief Complaint  Patient presents with  . Tachycardia    HISTORY OF PRESENT ILLNESS: Evelyn Willis  is a 60 y.o. female with a known history of depression, multiple sclerosis, coronary artery disease and stent placement almost 10 years ago but she said that she was never on aspirin or any other blood thinner. Not following with any cardiologist either. Has lung cancer diagnosed 7-8 month ago and following with radiation and chemotherapy at [facility]. Today morning at 4 AM break up with the severe palpitation, but denied any associated chest pain or shortness of breath. She called EMS and they noted her heart racing up to 200 nightly edema seen twice but it did not help much so ER physician gave her amiodarone injection which helped some and he started on amiodarone IV drip and given for admission.  PAST MEDICAL HISTORY:   Past Medical History  Diagnosis Date  . Cancer ([place])     lung cancer, radiation, chemo  . Depression   . Multiple sclerosis ([place])   . Coronary artery disease     said- stent was placed 10 yrs ago from now( 2017) in [place]    PAST SURGICAL HISTORY: History reviewed. No pertinent past surgical history.  SOCIAL HISTORY:  Social History  Substance Use Topics  . Smoking status: Current Every Day Smoker  . Smokeless tobacco: Not on file  . Alcohol Use: Yes    FAMILY HISTORY:  Family History  Problem Relation Age of Onset  . CAD Mother   . Atrial fibrillation Mother     DRUG ALLERGIES:  Allergies  Allergen Reactions  . Aspirin Other (See Comments)    Bleeding ulcers    REVIEW OF SYSTEMS:   CONSTITUTIONAL: No fever, fatigue or weakness.  EYES: No blurred or double  vision.  EARS, NOSE, AND THROAT: No tinnitus or ear pain.  RESPIRATORY: No cough, shortness of breath, wheezing or hemoptysis.  CARDIOVASCULAR: No chest pain, orthopnea, edema. Had severe palpitation. GASTROINTESTINAL: No nausea, vomiting, diarrhea or abdominal pain.  GENITOURINARY: No dysuria, hematuria.  ENDOCRINE: No polyuria, nocturia,  HEMATOLOGY: No anemia, easy bruising or bleeding SKIN: No rash or lesion. MUSCULOSKELETAL: No joint pain or arthritis.   NEUROLOGIC: No tingling, numbness, weakness.  PSYCHIATRY: No anxiety or depression.   MEDICATIONS AT HOME:  Prior to Admission medications   Medication Sig Start Date End Date Taking? Authorizing Provider  albuterol (VENTOLIN HFA) 108 (90 Base) MCG/ACT inhaler Inhale 2 puffs into the lungs every 4 (four) hours as needed. 07/06/15  Yes Historical Provider, MD  ALPRAZolam Duanne Moron) 1 MG tablet Take 1 tablet by mouth daily. 07/18/15  Yes Historical Provider, MD  amitriptyline (ELAVIL) 75 MG tablet Take 75 mg by mouth at bedtime. 02/10/14  Yes Historical Provider, MD  buPROPion (WELLBUTRIN XL) 150 MG 24 hr tablet Take 1 tablet by mouth daily. 07/19/15  Yes Historical Provider, MD  HYDROcodone-acetaminophen (NORCO) 10-325 MG tablet Take 1 tablet by mouth every 4 (four) hours as needed.   Yes Historical Provider, MD  mirtazapine (REMERON) 15 MG tablet Take 15 mg by mouth at bedtime. 08/18/12  Yes Historical Provider, MD  morphine (MS CONTIN) 15 MG 12 hr tablet Take 1 tablet  by mouth 2 (two) times daily. 08/04/15  Yes Historical Provider, MD  SPIRIVA HANDIHALER 18 MCG inhalation capsule Place 1 capsule into inhaler and inhale daily. 07/05/15  Yes Historical Provider, MD      PHYSICAL EXAMINATION:   VITAL SIGNS: Blood pressure 110/68, pulse 101, temperature 98.4 F (36.9 C), temperature source Oral, resp. rate 18, height '5\' 6"'$  (1.676 m), weight 53.524 kg (118 lb), SpO2 99 %.  GENERAL:  60 y.o.-year-old thin patient lying in the bed with no acute  distress.  EYES: Pupils equal, round, reactive to light and accommodation. No scleral icterus. Extraocular muscles intact.  HEENT: Head atraumatic, normocephalic. Oropharynx and nasopharynx clear.  NECK:  Supple, no jugular venous distention. No thyroid enlargement, no tenderness.  LUNGS: Normal breath sounds bilaterally, no wheezing, rales,rhonchi or crepitation. No use of accessory muscles of respiration.  CARDIOVASCULAR: S1, S2 normal. Tachycardia. No murmurs, rubs, or gallops.  ABDOMEN: Soft, nontender, nondistended. Bowel sounds present. No organomegaly or mass.  EXTREMITIES: No pedal edema, cyanosis, or clubbing.  NEUROLOGIC: Cranial nerves II through XII are intact. Muscle strength 5/5 in all extremities. Sensation intact. Gait not checked.  PSYCHIATRIC: The patient is alert and oriented x 3.  SKIN: No obvious rash, lesion, or ulcer.   LABORATORY PANEL:   CBC  Recent Labs Lab 08/06/15 0617  WBC 7.8  HGB 15.9  HCT 47.3*  PLT 303  MCV 97.3  MCH 32.8  MCHC 33.7  RDW 15.7*   ------------------------------------------------------------------------------------------------------------------  Chemistries   Recent Labs Lab 08/06/15 0615 08/06/15 0617  NA 144  --   K 4.2  --   CL 108  --   CO2 20*  --   GLUCOSE 109*  --   BUN 14  --   CREATININE 0.58  --   CALCIUM 9.6  --   MG  --  1.8  AST 23  --   ALT 17  --   ALKPHOS 93  --   BILITOT 0.4  --    ------------------------------------------------------------------------------------------------------------------ estimated creatinine clearance is 62.4 mL/min (by C-G formula based on Cr of 0.58). ------------------------------------------------------------------------------------------------------------------  Recent Labs  08/06/15 0617  TSH 1.310     Coagulation profile  Recent Labs Lab 08/06/15 0615  INR 0.97    ------------------------------------------------------------------------------------------------------------------- No results for input(s): DDIMER in the last 72 hours. -------------------------------------------------------------------------------------------------------------------  Cardiac Enzymes  Recent Labs Lab 08/06/15 0617  TROPONINI 0.04*   ------------------------------------------------------------------------------------------------------------------ Invalid input(s): POCBNP  ---------------------------------------------------------------------------------------------------------------  Urinalysis    Component Value Date/Time   COLORURINE YELLOW 09/09/2009 1950   APPEARANCEUR CLEAR 09/09/2009 1950   LABSPEC 1.017 09/09/2009 1950   PHURINE 5.5 09/09/2009 1950   GLUCOSEU NEGATIVE 09/09/2009 1950   HGBUR NEGATIVE 09/09/2009 1950   BILIRUBINUR NEGATIVE 09/09/2009 1950   KETONESUR NEGATIVE 09/09/2009 1950   PROTEINUR NEGATIVE 09/09/2009 1950   UROBILINOGEN 1.0 09/09/2009 1950   NITRITE NEGATIVE 09/09/2009 1950   LEUKOCYTESUR  09/09/2009 1950    NEGATIVE MICROSCOPIC NOT DONE ON URINES WITH NEGATIVE PROTEIN, BLOOD, LEUKOCYTES, NITRITE, OR GLUCOSE <1000 mg/dL.     RADIOLOGY: Dg Chest Portable 1 View  08/06/2015  CLINICAL DATA:  New onset of atrial fibrillation. Lung cancer. The patient awoke with palpitations and shortness of breath. She denies chest pain. EXAM: PORTABLE CHEST - 1 VIEW COMPARISON:  Two-view chest x-ray 05/19/2015. FINDINGS: Defibrillator/pacing pads are in place. The heart size is normal. Asymmetric left basilar airspace opacities are present. There is no effusion or edema. The upper lung fields are clear. The visualized soft  tissues and bony thorax are unremarkable. IMPRESSION: 1. Focal airspace opacities again noted at the left base. This may represent chronic scarring or recurrent pneumonia. 2. Emphysema. 3. No other acute abnormality. Electronically  Signed   By: San Morelle M.D.   On: 08/06/2015 08:44    EKG: Orders placed or performed during the hospital encounter of 08/06/15  . ED EKG  . ED EKG  . EKG 12-Lead  . EKG 12-Lead  . ED EKG  . ED EKG  . EKG 12-Lead  . EKG 12-Lead    IMPRESSION AND PLAN:  * Atrial fibrillation with rapid ventricular response   Started on IV amiodarone drip.   I will monitor on telemetry, follow serial troponin and get cardiology consult.   TSH and magnesium checked they are acceptable range.   There is some problem with the laboratory that the analyzer machines so we are not able to get potassium level until now.   Further management as per cardiology, currently ER started on heparin IV drip.   Urinalysis still awaited. Chest x-ray does not show any new findings.  * Depression   Continue Wellbutrin, Remeron, amitriptyline.  * COPD   No exacerbation currently, continue inhalers.  * Smoking  Counseled to quit smoking for 4 minutes. Patient is already trying for that and she agreed to completely quit.  All the records are reviewed and case discussed with ED provider. Management plans discussed with the patient, family and they are in agreement.  CODE STATUS: full code. Code Status History    This patient does not have a recorded code status. Please follow your organizational policy for patients in this situation.       TOTAL TIME TAKING CARE OF THIS PATIENT: 55 critical care minutes.    Vaughan Basta M.D on 08/06/2015   Between 7am to 6pm - Pager - [phone]  After 6pm go to [email or web address] - password [email or web address]  Office  [phone]  CC: Primary care physician; BABAOFF, Caryl Bis, MD   Note: This dictation was prepared with Dragon dictation along with smaller phrase technology. Any transcriptional errors that result from this process are unintentional.

## 2015-08-06 NOTE — ED Notes (Addendum)
Patient presents to the ED via EMS from home. Patient in SVT; rates >200 en route. Patient reports that she woke up having palpitations; denies CP. (+) SOB. Patient advising that she waited 30 minutes prior to calling EMS. EMS arrived and found patient in SVT - Adenocard '6mg'$  IVP given with no noted change. Patient then given Adenocard '12mg'$  IVP and rhythm slowed momentarily to reveal an irregular underlying rhythm. EMS then gave Diltiazem '10mg'$  IVP - presents to the ED with a rate in the 160s. Patient also reports that this same thing happened a few nights ago, however "it went away". Patient started Wellbutrin x 1 week ago.

## 2015-08-06 NOTE — Progress Notes (Signed)
ANTICOAGULATION CONSULT NOTE - Follow up Evelyn Willis for Heparin drip Indication: atrial fibrillation  Allergies  Allergen Reactions  . Aspirin Other (See Comments)    Bleeding ulcers   Patient Measurements: Height: '5\' 6"'$  (167.6 cm) Weight: 118 lb (53.524 kg) IBW/kg (Calculated) : 59.3 Heparin Dosing Weight: 53.5 kg  Vital Signs: Temp: 98.5 F (36.9 C) (03/11 1954) Temp Source: Oral (03/11 1954) BP: 102/50 mmHg (03/11 1954) Pulse Rate: 86 (03/11 1954)  Labs:  Recent Labs  08/06/15 0615  08/06/15 0617 08/06/15 1038 08/06/15 1408 08/06/15 1728 08/06/15 2141  HGB  --   --  15.9  --   --   --   --   HCT  --   --  47.3*  --   --   --   --   PLT  --   --  303  --   --   --   --   APTT 35  --   --   --   --   --   --   LABPROT 13.1  --   --   --   --   --   --   INR 0.97  --   --   --   --   --   --   HEPARINUNFRC  --   --   --   --  0.27*  --  0.39  CREATININE 0.58  --   --   --   --   --   --   TROPONINI  --   < > 0.04* 0.04* 0.03 0.04*  --   < > = values in this interval not displayed.  Estimated Creatinine Clearance: 62.4 mL/min (by C-G formula based on Cr of 0.58).   Medical History: Past Medical History  Diagnosis Date  . Cancer ([place])     lung cancer, radiation, chemo  . Depression   . Multiple sclerosis ([place])   . Coronary artery disease     said- stent was placed 10 yrs ago from now( 2017) in [place], [place]    Medications:  Scheduled:  . ALPRAZolam  1 mg Oral Daily  . amitriptyline  75 mg Oral QHS  . buPROPion  150 mg Oral Daily  . mirtazapine  15 mg Oral QHS  . morphine  15 mg Oral BID  . nicotine  14 mg Transdermal Daily  . sodium chloride flush  3 mL Intravenous Q12H  . tiotropium  1 capsule Inhalation Daily   Infusions:  . amiodarone 30 mg/hr (08/06/15 1237)  . heparin 900 Units/hr (08/06/15 1525)    Assessment: Pharmacy consulted to initiate and monitor heparin drip in a 60 yo female admitted for atrial fibrillation.  Per  notes, no previous anticoagulant use.  INR of 0.97  Goal of Therapy:  Heparin level 0.3-0.7 units/ml Monitor platelets by anticoagulation protocol: Yes   Plan:  3/11:  Heparin level at 2130 = 0.39.  Will continue infusion at current rate of 900 units/hr and recheck with AM labs tomorrow.   Darylene Price Montina Dorrance 08/06/2015,10:16 PM

## 2015-08-06 NOTE — ED Notes (Signed)
Attempted to call report to floor, RN unable to take report and will call back

## 2015-08-06 NOTE — Progress Notes (Signed)
ANTICOAGULATION CONSULT NOTE - Initial Consult  Pharmacy Consult for Heparin drip Indication: atrial fibrillation  Allergies  Allergen Reactions  . Aspirin Other (See Comments)    Bleeding ulcers    Patient Measurements: Height: '5\' 6"'$  (167.6 cm) Weight: 118 lb (53.524 kg) IBW/kg (Calculated) : 59.3 Heparin Dosing Weight: 53.5 kg  Vital Signs: Temp: 98.4 F (36.9 C) (03/11 0626) Temp Source: Oral (03/11 0626) BP: 110/68 mmHg (03/11 0830) Pulse Rate: 101 (03/11 0830)  Labs:  Recent Labs  08/06/15 0615 08/06/15 0617  HGB  --  15.9  HCT  --  47.3*  PLT  --  303  APTT 35  --   LABPROT 13.1  --   INR 0.97  --   CREATININE 0.58  --   TROPONINI  --  0.04*    Estimated Creatinine Clearance: 62.4 mL/min (by C-G formula based on Cr of 0.58).   Medical History: Past Medical History  Diagnosis Date  . Cancer ([place])     lung cancer, radiation, chemo  . Depression   . Multiple sclerosis ([place])   . Coronary artery disease     said- stent was placed 10 yrs ago from now( 2017) in [place], [place]    Medications:  Scheduled:   Infusions:  . amiodarone 60 mg/hr (08/06/15 0646)  . amiodarone    . heparin 800 Units/hr (08/06/15 3875)    Assessment: Pharmacy consulted to initiate and monitor heparin drip in a 60 yo female admitted for atrial fibrillation.  Per notes, no previous anticoagulant use.  INR of 0.97  Goal of Therapy:  Heparin level 0.3-0.7 units/ml Monitor platelets by anticoagulation protocol: Yes   Plan:  Give 3000 units bolus x 1 Start heparin infusion at 800 units/hr Check anti-Xa level in 6 hours and daily while on heparin Continue to monitor H&H and platelets  Armenta Erskin G 08/06/2015,10:20 AM

## 2015-08-06 NOTE — ED Notes (Signed)
Admitting MD at bedside.

## 2015-08-06 NOTE — ED Provider Notes (Signed)
[facility] Emergency Department Provider Note  ____________________________________________  Time seen: Approximately 6:21 AM  I have reviewed the triage vital signs and the nursing notes.   HISTORY  Chief Complaint Tachycardia    HPI Evelyn Willis is a 60 y.o. female with a past medical history that includeslung cancer status post chemotherapy and radiation about a year ago who presents by EMS for evaluation of rapid heart rate and palpitations.  It was acute onset and awoke her from sleep and she describes her symptoms as severe.  She has never had similar symptoms in the past except "maybe" a similar but much milder episode about a week ago.  She reports that she thought her heart was pounding but did not specifically have any chest pain.  Mild associated shortness of breath.  EMS arrived and found that her heart rate was greater than 200.  They gave adenosine 6 mg IV which did not have an effect.  They then gave adenosine 12 mg IV which briefly slowed her rate and showed what appears to be in underlying atrial flutter but then the rate increased again to a variable 180-220.  They then gave diltiazem 10 mg IV which slowed her rate somewhat but she became quite irregular, seeming to go from SVT to A. fib with RVR to a bigeminy pattern and back again.  Throughout all of that her blood pressure was normal and she was mentating well.  She has had no nausea or vomiting and no diaphoresis.  She has no numbness or tingling in her extremities and no chest pain nor abdominal pain.   Past Medical History  Diagnosis Date  . Cancer ([place])     lung cancer, radiation, chemo  . Depression   . Multiple sclerosis ([place])   . Coronary artery disease     said- stent was placed 10 yrs ago from now( 2017) in [place], [place]    Patient Active Problem List   Diagnosis Date Noted  . Atrial fibrillation with rapid ventricular response ([place]) 08/06/2015    History reviewed. No  pertinent past surgical history.  Current Outpatient Rx  Name  Route  Sig  Dispense  Refill  . albuterol (VENTOLIN HFA) 108 (90 Base) MCG/ACT inhaler   Inhalation   Inhale 2 puffs into the lungs every 4 (four) hours as needed.         . ALPRAZolam (XANAX) 1 MG tablet   Oral   Take 1 tablet by mouth daily.         Marland Kitchen amitriptyline (ELAVIL) 75 MG tablet   Oral   Take 75 mg by mouth at bedtime.         Marland Kitchen buPROPion (WELLBUTRIN XL) 150 MG 24 hr tablet   Oral   Take 1 tablet by mouth daily.         Marland Kitchen HYDROcodone-acetaminophen (NORCO) 10-325 MG tablet   Oral   Take 1 tablet by mouth every 4 (four) hours as needed.         . mirtazapine (REMERON) 15 MG tablet   Oral   Take 15 mg by mouth at bedtime.         Marland Kitchen morphine (MS CONTIN) 15 MG 12 hr tablet   Oral   Take 1 tablet by mouth 2 (two) times daily.         Marland Kitchen SPIRIVA HANDIHALER 18 MCG inhalation capsule   Inhalation   Place 1 capsule into inhaler and inhale daily.  Dispense as written.     Allergies Aspirin  Family History  Problem Relation Age of Onset  . CAD Mother   . Atrial fibrillation Mother     Social History Social History  Substance Use Topics  . Smoking status: Current Every Day Smoker  . Smokeless tobacco: None  . Alcohol Use: Yes    Review of Systems Constitutional: No fever/chills Eyes: No visual changes. ENT: No sore throat. Cardiovascular: Denies chest pain.Severe palpitations Respiratory: Mild shortness of breath. Gastrointestinal: No abdominal pain.  No nausea, no vomiting.  No diarrhea.  No constipation. Genitourinary: Negative for dysuria. Musculoskeletal: Negative for back pain. Skin: Negative for rash. Neurological: Negative for headaches, focal weakness or numbness.  Lightheadedness associated with the rapid heart rate  10-point ROS otherwise negative.  ____________________________________________   PHYSICAL EXAM:  ED Triage Vitals  Enc Vitals Group      BP 08/06/15 0626 124/77 mmHg     Pulse Rate 08/06/15 0626 168     Resp 08/06/15 0626 19     Temp 08/06/15 0626 98.4 F (36.9 C)     Temp Source 08/06/15 0626 Oral     SpO2 08/06/15 0626 97 %     Weight 08/06/15 0626 118 lb (53.524 kg)     Height 08/06/15 0626 '5\' 6"'$  (1.676 m)     Head Cir --      Peak Flow --      Pain Score 08/06/15 0627 0     Pain Loc --      Pain Edu? --      Excl. in [place]? --     Constitutional: Alert and oriented. Well appearing and in no acute distress. Eyes: Conjunctivae are normal. PERRL. EOMI. Head: Atraumatic. Nose: No congestion/rhinnorhea. Mouth/Throat: Mucous membranes are moist.  Oropharynx non-erythematous. Neck: No stridor.  No meningeal signs.   Cardiovascular: Rapid rate ranging from the 130s to the 220s with a brief episode as high as the 250s.  Irregularly irregular rhythm although she has stretches of narrow complex tachycardia consistent with SVT, then an atrial fibrillation pattern, then a bigeminy pattern that appears to be a PVC followed by a normal P-QRS-T complex,.  Respiratory: Normal respiratory effort.  No retractions. Lungs CTAB. Gastrointestinal: Soft and nontender. No distention.  Musculoskeletal: No lower extremity tenderness nor edema. No gross deformities of extremities. Neurologic:  Normal speech and language. No gross focal neurologic deficits are appreciated.  Skin:  Skin is warm, dry and intact. No rash noted.   ____________________________________________   LABS (all labs ordered are listed, but only abnormal results are displayed)  Labs Reviewed  CBC - Abnormal; Notable for the following:    HCT 47.3 (*)    RDW 15.7 (*)    All other components within normal limits  TROPONIN I - Abnormal; Notable for the following:    Troponin I 0.04 (*)    All other components within normal limits  COMPREHENSIVE METABOLIC PANEL - Abnormal; Notable for the following:    CO2 20 (*)    Glucose, Bld 109 (*)    Anion gap 16 (*)     All other components within normal limits  MAGNESIUM  TSH  T4, FREE  PROTIME-INR  APTT  TROPONIN I  TROPONIN I  TROPONIN I  HEPARIN LEVEL (UNFRACTIONATED)  URINALYSIS COMPLETEWITH MICROSCOPIC ([facility] ONLY)   ____________________________________________  EKG  ED ECG REPORT #1 I, Shantae Vantol, the attending physician, personally viewed and interpreted this ECG.   Date: 08/06/2015  EKG Time:  06:19  Rate: 160  Rhythm: SVT / ventricular bigeminy    Axis: Normal  Intervals:QTc 525, SVT  ST&T Change: Changes consistent with demand ischemia.   ED ECG REPORT #2 I, Jaskirat Zertuche, the attending physician, personally viewed and interpreted this ECG.   Date: 08/06/2015  EKG Time: 06:34  Rate: 115  Rhythm: atrial fibrillation, rate 115  Axis: Normal  Intervals:Irregular due to A. fib with QTc 524  ST&T Change: No evidence of acute MI although some probable rate related changes  ____________________________________________  RADIOLOGY   Dg Chest Portable 1 View  08/06/2015  CLINICAL DATA:  New onset of atrial fibrillation. Lung cancer. The patient awoke with palpitations and shortness of breath. She denies chest pain. EXAM: PORTABLE CHEST - 1 VIEW COMPARISON:  Two-view chest x-ray 05/19/2015. FINDINGS: Defibrillator/pacing pads are in place. The heart size is normal. Asymmetric left basilar airspace opacities are present. There is no effusion or edema. The upper lung fields are clear. The visualized soft tissues and bony thorax are unremarkable. IMPRESSION: 1. Focal airspace opacities again noted at the left base. This may represent chronic scarring or recurrent pneumonia. 2. Emphysema. 3. No other acute abnormality. Electronically Signed   By: San Morelle M.D.   On: 08/06/2015 08:44    ____________________________________________   PROCEDURES  Procedure(s) performed: None  Critical Care performed: Yes, see critical care note(s)  CRITICAL CARE Performed by: Hinda Kehr   Total critical care time: 60 minutes  Critical care time was exclusive of separately billable procedures and treating other patients.  Critical care was necessary to treat or prevent imminent or life-threatening deterioration.  Critical care was time spent personally by me on the following activities: development of treatment plan with patient and/or surrogate as well as nursing, discussions with consultants, evaluation of patient's response to treatment, examination of patient, obtaining history from patient or surrogate, ordering and performing treatments and interventions, ordering and review of laboratory studies, ordering and review of radiographic studies, pulse oximetry and re-evaluation of patient's condition.  ____________________________________________   INITIAL IMPRESSION / ASSESSMENT AND PLAN / ED COURSE  Pertinent labs & imaging results that were available during my care of the patient were reviewed by me and considered in my medical decision making (see chart for details).  The patient's heart rate initially was quite concerning with rates ranging from the 130s to the 250s.  She became quite lightheaded and symptomatic when the heart rate went up to the 250s but this was brief.  Given the number of therapeutic agent she had received prior to arrival and the fact she was still symptomatic, although normotensive, I gave her amiodarone 150 mg IV slow push over 10 minutes and then started a rate of 60 mg/h.  Within about 5 minutes she began responding to the medication and a corrected her heart rate down to around 100-120.  I reassessed her multiple times over the course of the next hour and she stated that she felt much better, had no chest pain or shortness of breath, and remained in A. fib but with no evidence of acute ischemia on her EKG.  I called the lab at 8:00 AM after the chemistry including the troponin have been pending for 90 minutes and was told that they are having  trouble with the analyzer but that they will get the results back as soon as possible.  I will hold off on any electrolyte treatment such as magnesium until we get these results.  I have also  ordered heparin per protocol and a PT/PTT.  ____________________________________________  FINAL CLINICAL IMPRESSION(S) / ED DIAGNOSES  Final diagnoses:  Atrial fibrillation with RVR ([place])  Other specified cardiac arrhythmias      NEW MEDICATIONS STARTED DURING THIS VISIT:  New Prescriptions   No medications on file      Note:  This document was prepared using Dragon voice recognition software and may include unintentional dictation errors.   Hinda Kehr, MD 08/06/15 [phone]

## 2015-08-06 NOTE — ED Notes (Signed)
Patient adamant about getting OOB to in room toilet; unable to use bed pan despite attempts.

## 2015-08-06 NOTE — Progress Notes (Signed)
Evelyn Willis is a 60 y.o. female patient admitted from ED awake, alert - oriented  X 4 - no acute distress noted.  VSS - Blood pressure 128/98, pulse 88, temperature 98.1 F (36.7 C), temperature source Oral, resp. rate 18, height '5\' 6"'$  (1.676 m), weight 53.524 kg (118 lb), SpO2 99 %.    IV in place, occlusive dsg intact without redness.  Orientation to room, and floor completed with information packet given to patient/family. Admission INP armband ID verified with patient/family, and in place.   SR up x 2, fall assessment complete, with patient and family able to verbalize understanding of risk associated with falls, and verbalized understanding to call nsg before up out of bed.  Call light within reach, patient able to voice, and demonstrate understanding.  Skin, clean-dry- intact without evidence of bruising, or skin tears.   No evidence of skin break down noted on exam. Skin assessed with Tammy T. Tele box verified with Prince Solian, RN and [place], NT.      Will cont to eval and treat per MD orders.  Horton Finer, RN 08/06/2015 4:18 PM

## 2015-08-06 NOTE — Progress Notes (Signed)
ANTICOAGULATION CONSULT NOTE - Follow up [place] for Heparin drip Indication: atrial fibrillation  Allergies  Allergen Reactions  . Aspirin Other (See Comments)    Bleeding ulcers    Patient Measurements: Height: '5\' 6"'$  (167.6 cm) Weight: 118 lb (53.524 kg) IBW/kg (Calculated) : 59.3 Heparin Dosing Weight: 53.5 kg  Vital Signs: Temp: 98.1 F (36.7 C) (03/11 1132) Temp Source: Oral (03/11 1132) BP: 128/98 mmHg (03/11 1409) Pulse Rate: 88 (03/11 1409)  Labs:  Recent Labs  08/06/15 0615 08/06/15 0617 08/06/15 1038 08/06/15 1408  HGB  --  15.9  --   --   HCT  --  47.3*  --   --   PLT  --  303  --   --   APTT 35  --   --   --   LABPROT 13.1  --   --   --   INR 0.97  --   --   --   HEPARINUNFRC  --   --   --  0.27*  CREATININE 0.58  --   --   --   TROPONINI  --  0.04* 0.04* 0.03    Estimated Creatinine Clearance: 62.4 mL/min (by C-G formula based on Cr of 0.58).   Medical History: Past Medical History  Diagnosis Date  . Cancer ([place])     lung cancer, radiation, chemo  . Depression   . Multiple sclerosis ([place])   . Coronary artery disease     said- stent was placed 10 yrs ago from now( 2017) in [place], [place]    Medications:  Scheduled:  . ALPRAZolam  1 mg Oral Daily  . amitriptyline  75 mg Oral QHS  . buPROPion  150 mg Oral Daily  . heparin  800 Units Intravenous Once  . mirtazapine  15 mg Oral QHS  . morphine  15 mg Oral BID  . sodium chloride flush  3 mL Intravenous Q12H  . tiotropium  1 capsule Inhalation Daily   Infusions:  . amiodarone 30 mg/hr (08/06/15 1237)  . heparin 800 Units/hr (08/06/15 2094)    Assessment: Pharmacy consulted to initiate and monitor heparin drip in a 60 yo female admitted for atrial fibrillation.  Per notes, no previous anticoagulant use.  INR of 0.97  Goal of Therapy:  Heparin level 0.3-0.7 units/ml Monitor platelets by anticoagulation protocol: Yes   Plan:  Give 3000 units bolus x 1 Start heparin  infusion at 800 units/hr Check anti-Xa level in 6 hours and daily while on heparin Continue to monitor H&H and platelets  3/11:  Heparin level at 1408 = 0.27.  Will give 800 unit bolus x 1 and increase rate to 900 units/hr. Will recheck Heparin level in 6 hrs.  Evelyn Willis A 08/06/2015,2:59 PM

## 2015-08-07 ENCOUNTER — Inpatient Hospital Stay (HOSPITAL_COMMUNITY)
Admit: 2015-08-07 | Discharge: 2015-08-07 | Disposition: A | Discharge status: Home / Self Care | Payer: PPO | Attending: Cardiovascular Disease | Admitting: Cardiovascular Disease

## 2015-08-07 DIAGNOSIS — I4891 Unspecified atrial fibrillation: Secondary | ICD-10-CM

## 2015-08-07 DIAGNOSIS — C3490 Malignant neoplasm of unspecified part of unspecified bronchus or lung: Secondary | ICD-10-CM

## 2015-08-07 DIAGNOSIS — J432 Centrilobular emphysema: Secondary | ICD-10-CM | POA: Insufficient documentation

## 2015-08-07 DIAGNOSIS — R0602 Shortness of breath: Secondary | ICD-10-CM

## 2015-08-07 DIAGNOSIS — F32A Depression, unspecified: Secondary | ICD-10-CM | POA: Insufficient documentation

## 2015-08-07 DIAGNOSIS — C3402 Malignant neoplasm of left main bronchus: Secondary | ICD-10-CM | POA: Insufficient documentation

## 2015-08-07 DIAGNOSIS — R0600 Dyspnea, unspecified: Secondary | ICD-10-CM | POA: Insufficient documentation

## 2015-08-07 DIAGNOSIS — Z7189 Other specified counseling: Secondary | ICD-10-CM | POA: Insufficient documentation

## 2015-08-07 DIAGNOSIS — Z720 Tobacco use: Secondary | ICD-10-CM

## 2015-08-07 DIAGNOSIS — F329 Major depressive disorder, single episode, unspecified: Secondary | ICD-10-CM

## 2015-08-07 DIAGNOSIS — G35 Multiple sclerosis: Secondary | ICD-10-CM | POA: Insufficient documentation

## 2015-08-07 DIAGNOSIS — R000 Tachycardia, unspecified: Secondary | ICD-10-CM | POA: Insufficient documentation

## 2015-08-07 DIAGNOSIS — F17200 Nicotine dependence, unspecified, uncomplicated: Secondary | ICD-10-CM | POA: Insufficient documentation

## 2015-08-07 LAB — CBC
HCT: 38.3 % (ref 35.0–47.0)
Hemoglobin: 12.8 g/dL (ref 12.0–16.0)
MCH: 31.8 pg (ref 26.0–34.0)
MCHC: 33.3 g/dL (ref 32.0–36.0)
MCV: 95.5 fL (ref 80.0–100.0)
Platelets: 263 10*3/uL (ref 150–440)
RBC: 4.01 MIL/uL (ref 3.80–5.20)
RDW: 15.4 % — ABNORMAL HIGH (ref 11.5–14.5)
WBC: 6.7 10*3/uL (ref 3.6–11.0)

## 2015-08-07 LAB — BASIC METABOLIC PANEL
Anion gap: 8 (ref 5–15)
BUN: 18 mg/dL (ref 6–20)
CALCIUM: 8.4 mg/dL — AB (ref 8.9–10.3)
CHLORIDE: 107 mmol/L (ref 101–111)
CO2: 26 mmol/L (ref 22–32)
CREATININE: 0.63 mg/dL (ref 0.44–1.00)
GFR calc non Af Amer: >60 mL/min (ref >60)
GLUCOSE: 110 mg/dL — AB (ref 65–99)
Potassium: 3.4 mmol/L — ABNORMAL LOW (ref 3.5–5.1)
Sodium: 141 mmol/L (ref 135–145)

## 2015-08-07 LAB — ECHOCARDIOGRAM COMPLETE
Height: 66 in
Weight: 1888 oz

## 2015-08-07 LAB — HEPARIN LEVEL (UNFRACTIONATED): HEPARIN UNFRACTIONATED: 0.46 [IU]/mL (ref 0.30–0.70)

## 2015-08-07 MED ORDER — AMIODARONE HCL 200 MG PO TABS: 200.0000 mg | ORAL_TABLET | Freq: Two times a day (BID) | ORAL | Status: DC

## 2015-08-07 MED ORDER — RIVAROXABAN 20 MG PO TABS: 20.0000 mg | ORAL_TABLET | Freq: Every day | ORAL | Status: DC

## 2015-08-07 MED ORDER — AMIODARONE HCL 400 MG PO TABS: 400.0000 mg | ORAL_TABLET | Freq: Two times a day (BID) | ORAL | Status: DC

## 2015-08-07 MED ORDER — AMIODARONE HCL 200 MG PO TABS
400.0000 mg | ORAL_TABLET | Freq: Two times a day (BID) | ORAL | Status: DC
  Administered 2015-08-07: 400 mg via ORAL
  Filled 2015-08-07: qty 2

## 2015-08-07 MED ORDER — RIVAROXABAN 20 MG PO TABS
20.0000 mg | ORAL_TABLET | Freq: Every day | ORAL | Status: DC
  Administered 2015-08-07: 20 mg via ORAL
  Filled 2015-08-07: qty 1

## 2015-08-07 NOTE — Progress Notes (Signed)
*  PRELIMINARY RESULTS* Echocardiogram 2D Echocardiogram has been performed.  Evelyn Willis 08/07/2015, 2:25 PM

## 2015-08-07 NOTE — Progress Notes (Signed)
ANTICOAGULATION CONSULT NOTE - Follow up [place] for Heparin drip Indication: atrial fibrillation  Allergies  Allergen Reactions  . Aspirin Other (See Comments)    Bleeding ulcers   Patient Measurements: Height: '5\' 6"'$  (167.6 cm) Weight: 118 lb (53.524 kg) IBW/kg (Calculated) : 59.3 Heparin Dosing Weight: 53.5 kg  Vital Signs: Temp: 97.8 F (36.6 C) (03/12 0439) Temp Source: Oral (03/12 0439) BP: 125/73 mmHg (03/12 0439) Pulse Rate: 74 (03/12 0439)  Labs:  Recent Labs  08/06/15 0615  08/06/15 0617 08/06/15 1038 08/06/15 1408 08/06/15 1728 08/06/15 2141 08/07/15 0533  HGB  --   --  15.9  --   --   --   --  12.8  HCT  --   --  47.3*  --   --   --   --  38.3  PLT  --   --  303  --   --   --   --  263  APTT 35  --   --   --   --   --   --   --   LABPROT 13.1  --   --   --   --   --   --   --   INR 0.97  --   --   --   --   --   --   --   HEPARINUNFRC  --   --   --   --  0.27*  --  0.39 0.46  CREATININE 0.58  --   --   --   --   --   --  0.63  TROPONINI  --   < > 0.04* 0.04* 0.03 0.04*  --   --   < > = values in this interval not displayed.  Estimated Creatinine Clearance: 62.4 mL/min (by C-G formula based on Cr of 0.63).   Medical History: Past Medical History  Diagnosis Date  . Cancer ([place])     lung cancer, radiation, chemo  . Depression   . Multiple sclerosis ([place])   . Coronary artery disease     said- stent was placed 10 yrs ago from now( 2017) in [place], [place]    Medications:  Scheduled:  . ALPRAZolam  1 mg Oral Daily  . amitriptyline  75 mg Oral QHS  . buPROPion  150 mg Oral Daily  . mirtazapine  15 mg Oral QHS  . morphine  15 mg Oral BID  . nicotine  14 mg Transdermal Daily  . sodium chloride flush  3 mL Intravenous Q12H  . tiotropium  1 capsule Inhalation Daily   Infusions:  . amiodarone 30 mg/hr (08/06/15 2303)  . heparin 900 Units/hr (08/06/15 1525)    Assessment: Pharmacy consulted to initiate and monitor heparin drip  in a 60 yo female admitted for atrial fibrillation.  Per notes, no previous anticoagulant use.  INR of 0.97  Goal of Therapy:  Heparin level 0.3-0.7 units/ml Monitor platelets by anticoagulation protocol: Yes   Plan:  3/11:  Heparin level at 2130 = 0.39.  Will continue infusion at current rate of 900 units/hr and recheck with AM labs tomorrow.    3/12 AM heparin level 0.46. Continue current regimen. Recheck CBC and heparin level tomorrow AM.  Denielle Bayard S 08/07/2015,6:23 AM

## 2015-08-07 NOTE — Progress Notes (Signed)
Patient discharged home as ordered,instructions explained and well understood,Escorted by staff member via wheelchair.

## 2015-08-07 NOTE — Consult Note (Signed)
Cardiology Consultation Note  Patient ID: Evelyn Willis, MRN: [id number], DOB/AGE: [birth year] 60 y.o. Admit date: 08/06/2015   Date of Consult: 08/07/2015 Primary Physician: Marcello Fennel, MD Primary Cardiologist: Rockey Situ, tim  Chief Complaint: tachycardia, SOB Reason for Consult: atrial fibrillation with RVR Consult placed by Dr. Anselm Jungling for atrial fibrillation with RVR  HPI: 60 y.o. female with h/o Lung cancer, radiation and chemotherapy at [facility], atherosclerosis on CT scan, long history of smoking who continues to smoke, history of gastric ulcers requiring surgery (secondary to overuse of NSAIDs), presenting with tachycardia, shortness of breath.  Symptoms started 2 nights ago, early Saturday morning 08/06/2015. She woke at 3 AM with acute onset of tachycardia, felt her heart was pounding out of her chest. She called 911, notes indicated she received several doses of adenosine with no improvement of her arrhythmia. She remained with elevated heart rate. She was taken to the emergency room, started on amiodarone infusion with improvement of her heart rate, felt to have atrial fibrillation with RVR.   On the floor she has continued on amiodarone infusion, was also started on heparin infusion. Converted shortly to normal sinus rhythm on the floor 24 hours ago. Feels back to her baseline, denies any shortness of breath, leg edema, chest pain.  She does report having similar episode of tachycardia several days ago though it did not last very long, possibly 10 minutes. Denies any recent stressors, does have significant stress at home, lost her mother who is 15 years old, now takes care of 52 year old father, she is a primary caretaker. She is concerned about his safety while she is in the hospital, hopes to go home today.  Denies having any  recent cardiac workup, no previous cardiologist. Notes indicate stent to coronary artery 10 years ago, no follow-up since that time    Past  Medical History  Diagnosis Date  . Cancer ([place])     lung cancer, radiation, chemo  . Depression   . Multiple sclerosis ([place])   . Coronary artery disease     said- stent was placed 10 yrs ago from now( 2017) in [place], [place]      Most Recent Cardiac Studies: Echocardiogram pending   Surgical History: History reviewed. No pertinent past surgical history.   Home Meds: Prior to Admission medications   Medication Sig Start Date End Date Taking? Authorizing Provider  albuterol (VENTOLIN HFA) 108 (90 Base) MCG/ACT inhaler Inhale 2 puffs into the lungs every 4 (four) hours as needed. 07/06/15  Yes Historical Provider, MD  ALPRAZolam Duanne Moron) 1 MG tablet Take 1 tablet by mouth daily. 07/18/15  Yes Historical Provider, MD  amitriptyline (ELAVIL) 75 MG tablet Take 75 mg by mouth at bedtime. 02/10/14  Yes Historical Provider, MD  buPROPion (WELLBUTRIN XL) 150 MG 24 hr tablet Take 1 tablet by mouth daily. 07/19/15  Yes Historical Provider, MD  HYDROcodone-acetaminophen (NORCO) 10-325 MG tablet Take 1 tablet by mouth every 4 (four) hours as needed.   Yes Historical Provider, MD  mirtazapine (REMERON) 15 MG tablet Take 15 mg by mouth at bedtime. 08/18/12  Yes Historical Provider, MD  morphine (MS CONTIN) 15 MG 12 hr tablet Take 1 tablet by mouth 2 (two) times daily. 08/04/15  Yes Historical Provider, MD  SPIRIVA HANDIHALER 18 MCG inhalation capsule Place 1 capsule into inhaler and inhale daily. 07/05/15  Yes Historical Provider, MD    Inpatient Medications:  . ALPRAZolam  1 mg Oral Daily  . amiodarone  400 mg Oral  BID  . amitriptyline  75 mg Oral QHS  . buPROPion  150 mg Oral Daily  . mirtazapine  15 mg Oral QHS  . morphine  15 mg Oral BID  . nicotine  14 mg Transdermal Daily  . rivaroxaban  20 mg Oral Daily  . sodium chloride flush  3 mL Intravenous Q12H  . tiotropium  1 capsule Inhalation Daily      Allergies:  Allergies  Allergen Reactions  . Aspirin Other (See Comments)    Bleeding ulcers     Social History   Social History  . Marital Status: Unknown    Spouse Name: N/A  . Number of Children: N/A  . Years of Education: N/A   Occupational History  . Not on file.   Social History Main Topics  . Smoking status: Current Every Day Smoker  . Smokeless tobacco: Not on file  . Alcohol Use: Yes  . Drug Use: Not on file  . Sexual Activity: Not on file   Other Topics Concern  . Not on file   Social History Narrative     Family History  Problem Relation Age of Onset  . CAD Mother   . Atrial fibrillation Mother      Review of Systems: Review of Systems  Constitutional: Negative.   Respiratory:       Shortness of breath in the setting of tachycardia, now resolved  Cardiovascular: Positive for palpitations.       Severe tachycardia, now resolved  Gastrointestinal: Negative.   Musculoskeletal: Negative.   Neurological: Negative.   Psychiatric/Behavioral: Negative.   All other systems reviewed and are negative.   Labs:  Recent Labs  08/06/15 0617 08/06/15 1038 08/06/15 1408 08/06/15 1728  TROPONINI 0.04* 0.04* 0.03 0.04*   Lab Results  Component Value Date   WBC 6.7 08/07/2015   HGB 12.8 08/07/2015   HCT 38.3 08/07/2015   MCV 95.5 08/07/2015   PLT 263 08/07/2015    Recent Labs Lab 08/06/15 0615 08/07/15 0533  NA 144 141  K 4.2 3.4*  CL 108 107  CO2 20* 26  BUN 14 18  CREATININE 0.58 0.63  CALCIUM 9.6 8.4*  PROT 7.8  --   BILITOT 0.4  --   ALKPHOS 93  --   ALT 17  --   AST 23  --   GLUCOSE 109* 110*   No results found for: CHOL, HDL, LDLCALC, TRIG Lab Results  Component Value Date   DDIMER * 09/07/2009    1.92        AT THE INHOUSE ESTABLISHED CUTOFF VALUE OF 0.48 ug/mL FEU, THIS ASSAY HAS BEEN DOCUMENTED IN THE LITERATURE TO HAVE A SENSITIVITY AND NEGATIVE PREDICTIVE VALUE OF AT LEAST 98 TO 99%.  THE TEST RESULT SHOULD BE CORRELATED WITH AN ASSESSMENT OF THE CLINICAL PROBABILITY OF DVT / VTE.    Radiology/Studies:   Chest x-ray reviewed independently by myself   Dg Chest Portable 1 View  08/06/2015  CLINICAL DATA:  New onset of atrial fibrillation. Lung cancer. The patient awoke with palpitations and shortness of breath. She denies chest pain. EXAM: PORTABLE CHEST - 1 VIEW COMPARISON:  Two-view chest x-ray 05/19/2015. FINDINGS: Defibrillator/pacing pads are in place. The heart size is normal. Asymmetric left basilar airspace opacities are present. There is no effusion or edema. The upper lung fields are clear. The visualized soft tissues and bony thorax are unremarkable. IMPRESSION: 1. Focal airspace opacities again noted at the left base. This may represent chronic scarring  or recurrent pneumonia. 2. Emphysema. 3. No other acute abnormality. Electronically Signed   By: San Morelle M.D.   On: 08/06/2015 08:44   Mm Screening Breast Tomo Bilateral  07/28/2015  CLINICAL DATA:  Screening. EXAM: DIGITAL SCREENING BILATERAL MAMMOGRAM WITH 3D TOMO WITH CAD COMPARISON:  None. ACR Breast Density Category b: There are scattered areas of fibroglandular density. FINDINGS: There are no findings suspicious for malignancy. Images were processed with CAD. IMPRESSION: No mammographic evidence of malignancy. A result letter of this screening mammogram will be mailed directly to the patient. RECOMMENDATION: Screening mammogram in one year. (Code:SM-B-01Y) BI-RADS CATEGORY  1: Negative. Electronically Signed   By: Curlene Dolphin M.D.   On: 07/28/2015 08:39    EKG: EKG shows atrial fibrillation with ventricular rate up to 200 bpm   follow-up EKG documenting normal sinus rhythm Both EKGs reviewed independently by myself  Weights: Filed Weights   08/06/15 0626  Weight: 118 lb (53.524 kg)     Physical Exam:Telemetry reviewed by myself showing normal sinus rhythm  Blood pressure 103/56, pulse 64, temperature 98 F (36.7 C), temperature source Oral, resp. rate 18, height '5\' 6"'$  (1.676 m), weight 118 lb (53.524 kg), SpO2 96  %. Body mass index is 19.05 kg/(m^2). General: Well developed, thin, in no acute distress. Head: Normocephalic, atraumatic, sclera non-icteric, no xanthomas, nares are without discharge.  Neck: Negative for carotid bruits. JVD not elevated. Lungs: moderately decreased breath sounds throughout, clear to auscultation without wheezes, rales, or rhonchi. Breathing is unlabored. Heart: RRR with S1 S2. No murmurs, rubs, or gallops appreciated. Abdomen: Soft, non-tender, non-distended with normoactive bowel sounds. No hepatomegaly. No rebound/guarding. No obvious abdominal masses. Msk:  Strength and tone appear normal for age. Extremities: No clubbing or cyanosis. No edema.  Distal pedal pulses are 2+ and equal bilaterally. Neuro: Alert and oriented X 3. No facial asymmetry. No focal deficit. Moves all extremities spontaneously. Psych:  Responds to questions appropriately with a normal affect.    Assessment and Plan:   1. Atrial fibrillation, paroxysmal   severe episode starting 3 in the morning 08/06/2015 Also had episode several days prior to that No recent stressors that may have contributed to her symptoms, she denies any sleep apnea --I have placed order for Echocardiogram,  pending today to rule out structural heart disease --Order placed to stop the amiodarone infusion Or place to start amiodarone 400 mg twice a day for 5 days then down to 200 mg twice a day --Long discussion concerning anticoagulation, she prefers once daily dosing --- Were placed to stop heparin infusion Order placed to start xarelto 20 mg daily Coupon for free month of medication, xarelto, provided  --- We will schedule for close follow-up in clinic   recommended she call our office if she has additional episodes of tachycardia  2) lung cancer Status post chemotherapy, radiation She reports she is in remission with 6 month follow-up at [facility] Full details unclear  3) history of gastric ulcers She reports  having surgery with partial gastrectomy for ulcers No recent bleed since 2011 time of her surgery Long discussion concerning NSAIDs, and being on anticoagulation Recommended no aspirin  4) smoker Continues to smoke, started age 50  Counseled to quit smoking for 4 minutes. recommended smoking cessation  5) coronary artery disease History of stent 10 years ago, denies any symptoms concerning for angina   CT scan of the chest reports in the past detailing atherosclerosis --Primary care does not have lipid panel in  the system We'll try to add to a.m. labs as morning --If cholesterol elevated, would recommend low-dose statin --Recommended smoking cessation --We'll consider outpatient stress testing  6) multiple sclerosis Management per primary care  7) depression On Wellbutrin, Remeron, amitriptyline. She reports symptoms are stable  8) COPD: Continue inhalers, stable  9) encounter for anticoagulation Long discussion concerning risk and benefit of anticoagulation She is willing to start xarelto 20 mg daily given paroxysmal atrial fibrillation. She reports that she cannot afford a stroke as she is primary caretaker for family  We did discuss risk of bleeding, signs and symptoms, and that if she ever develops these signs that she stopped the blood thinner and call our office    Signed, Esmond Plants, MD. Ph.D [facility] 08/07/2015, 12:08 PM

## 2015-08-08 ENCOUNTER — Telehealth: Payer: Self-pay | Admitting: Cardiovascular Disease

## 2015-08-08 NOTE — Telephone Encounter (Signed)
Would stop the blood thinner, xarelto. Stay on her other medications She may need to contact her primary care physician for referral to urology May have bleed coming from her bladder, needs to be evaluated

## 2015-08-08 NOTE — Telephone Encounter (Signed)
Patient contacted regarding discharge from [facility] on August 07, 2015.  Patient understands to follow up with provider Gollan on March 20 at 10:40am at [facility]. Patient understands discharge instructions? yes Patient understands medications and regiment? yes Patient understands to bring all medications to this visit? yes  Pt reports blood in her urine yesterday prior to discharge. States she notified RN  before leaving hospital and "he just sorta of blew it off". When she first noticed bloody urine, she reports it was "pinkish" and now "it's past that" indicating it is getting more red in color.  She has a history of gastric ulcers secondary to NSAID use for which she had surgery in 2011.  Denies any other s/s of bleeding other than urine. Pt was started on xarelto during admission for afib. She is taking '20mg'$  daily. Advised pt to continue to monitor and will forward to MD to advise.  Forward to Dr. Rockey Situ

## 2015-08-08 NOTE — Telephone Encounter (Signed)
S/w pt regarding Dr. Donivan Scull recommendations.  She states she has picked up xarelto prescription but has not taken today's dosage. She understands to hold this until March appt.  Pt verbalized understanding and is agreeable w/plan.

## 2015-08-08 NOTE — Telephone Encounter (Signed)
Pt thinks she is having blood in her urine. States she was D/C over the weekend and has a TCM appt on 3/20 with Dr. Rockey Situ. She would like to know if this is ok. Please call and advise

## 2015-08-09 NOTE — Discharge Summary (Signed)
[place] [facility] [place] NAME: Evelyn Willis    MR#:  [id number]  DATE OF BIRTH:  1955-06-12  DATE OF ADMISSION:  08/06/2015 ADMITTING PHYSICIAN: Vaughan Basta, MD  DATE OF DISCHARGE: 08/07/2015  5:44 PM  PRIMARY CARE PHYSICIAN: BABAOFF, MARC E, MD    ADMISSION DIAGNOSIS:  Atrial fibrillation with RVR (HCC) [I48.91] Other specified cardiac arrhythmias [I49.8]  DISCHARGE DIAGNOSIS:  Principal Problem:   Atrial fibrillation with rapid ventricular response (HCC) Active Problems:   Malignant neoplasm of lung (HCC)   Depression   Multiple sclerosis ([facility])   Tachycardia   Shortness of breath   Encounter for anticoagulation discussion and counseling   Smoker   Centrilobular emphysema ([place])   SECONDARY DIAGNOSIS:   Past Medical History  Diagnosis Date  . Cancer ([place])     lung cancer, radiation, chemo  . Depression   . Multiple sclerosis ([place])   . Coronary artery disease     said- stent was placed 10 yrs ago from now( 2017) in [place], [place]:   * Atrial fibrillation with rapid ventricular response  Started on IV amiodarone drip.  monitored on telemetry, followed serial troponin and got cardiology consult.  TSH and magnesium checked they are acceptable range.  Electrolytes in acceptable range. Further management as per cardiology,    Suggested to start on amiodarone and Xarelto and d/c home and follow up next week.   Urinalysis was negatvie. Chest x-ray does not show any new findings.  * Depression  Continue Wellbutrin, Remeron, amitriptyline.  * COPD  No exacerbation currently, continue inhalers.  * Smoking Counseled to quit smoking for 4 minutes. Patient is already trying for that and she agreed to completely quit.  DISCHARGE CONDITIONS:   Stable.  CONSULTS OBTAINED:     DRUG ALLERGIES:   Allergies  Allergen Reactions  . Aspirin Other (See Comments)    Bleeding ulcers     DISCHARGE MEDICATIONS:   Discharge Medication List as of 08/07/2015  4:12 PM    START taking these medications   Details  rivaroxaban (XARELTO) 20 MG TABS tablet Take 1 tablet (20 mg total) by mouth daily with supper., Starting 08/07/2015, Until Discontinued, Print      CONTINUE these medications which have CHANGED   Details  !! amiodarone (PACERONE) 200 MG tablet Take 1 tablet (200 mg total) by mouth 2 (two) times daily. Start after 5 days of taking 400 mg twice daily., Starting 08/13/2015, Until Discontinued, Print    !! amiodarone (PACERONE) 400 MG tablet Take 1 tablet (400 mg total) by mouth 2 (two) times daily., Starting 08/07/2015, Until Discontinued, Print     !! - Potential duplicate medications found. Please discuss with provider.    CONTINUE these medications which have NOT CHANGED   Details  albuterol (VENTOLIN HFA) 108 (90 Base) MCG/ACT inhaler Inhale 2 puffs into the lungs every 4 (four) hours as needed., Starting 07/06/2015, Until Discontinued, Historical Med    ALPRAZolam (XANAX) 1 MG tablet Take 1 tablet by mouth daily., Starting 07/18/2015, Until Discontinued, Historical Med    amitriptyline (ELAVIL) 75 MG tablet Take 75 mg by mouth at bedtime., Starting 02/10/2014, Until Discontinued, Historical Med    buPROPion (WELLBUTRIN XL) 150 MG 24 hr tablet Take 1 tablet by mouth daily., Starting 07/19/2015, Until Discontinued, Historical Med    HYDROcodone-acetaminophen (NORCO) 10-325 MG tablet Take 1 tablet by mouth every 4 (four) hours as needed., Until Discontinued, Historical Med  mirtazapine (REMERON) 15 MG tablet Take 15 mg by mouth at bedtime., Starting 08/18/2012, Until Discontinued, Historical Med    morphine (MS CONTIN) 15 MG 12 hr tablet Take 1 tablet by mouth 2 (two) times daily., Starting 08/04/2015, Until Discontinued, Historical Med    SPIRIVA HANDIHALER 18 MCG inhalation capsule Place 1 capsule into inhaler and inhale daily., Starting 07/05/2015, Until  Discontinued, Historical Med         DISCHARGE INSTRUCTIONS:    follow with cardiology clinic in 1 week.  If you experience worsening of your admission symptoms, develop shortness of breath, life threatening emergency, suicidal or homicidal thoughts you must seek medical attention immediately by calling 911 or calling your MD immediately  if symptoms less severe.  You Must read complete instructions/literature along with all the possible adverse reactions/side effects for all the Medicines you take and that have been prescribed to you. Take any new Medicines after you have completely understood and accept all the possible adverse reactions/side effects.   Please note  You were cared for by a hospitalist during your hospital stay. If you have any questions about your discharge medications or the care you received while you were in the hospital after you are discharged, you can call the unit and asked to speak with the hospitalist on call if the hospitalist that took care of you is not available. Once you are discharged, your primary care physician will handle any further medical issues. Please note that NO REFILLS for any discharge medications will be authorized once you are discharged, as it is imperative that you return to your primary care physician (or establish a relationship with a primary care physician if you do not have one) for your aftercare needs so that they can reassess your need for medications and monitor your lab values.    Today   CHIEF COMPLAINT:   Chief Complaint  Patient presents with  . Tachycardia    HISTORY OF PRESENT ILLNESS:  Evelyn Willis  is a 60 y.o. female with a known history of depression, multiple sclerosis, coronary artery disease and stent placement almost 10 years ago but she said that she was never on aspirin or any other blood thinner. Not following with any cardiologist either. Has lung cancer diagnosed 7-8 month ago and following with radiation  and chemotherapy at [facility]. Today morning at 4 AM break up with the severe palpitation, but denied any associated chest pain or shortness of breath. She called EMS and they noted her heart racing up to 200 nightly edema seen twice but it did not help much so ER physician gave her amiodarone injection which helped some and he started on amiodarone IV drip and given for admission.   VITAL SIGNS:  Blood pressure 103/56, pulse 64, temperature 98 F (36.7 C), temperature source Oral, resp. rate 18, height '5\' 6"'$  (1.676 m), weight 53.524 kg (118 lb), SpO2 96 %.  I/O:  No intake or output data in the 24 hours ending 08/09/15 1723  PHYSICAL EXAMINATION:   GENERAL: 60 y.o.-year-old thin patient lying in the bed with no acute distress.  EYES: Pupils equal, round, reactive to light and accommodation. No scleral icterus. Extraocular muscles intact.  HEENT: Head atraumatic, normocephalic. Oropharynx and nasopharynx clear.  NECK: Supple, no jugular venous distention. No thyroid enlargement, no tenderness.  LUNGS: Normal breath sounds bilaterally, no wheezing, rales,rhonchi or crepitation. No use of accessory muscles of respiration.  CARDIOVASCULAR: S1, S2 normal. Tachycardia. No murmurs, rubs, or gallops.  ABDOMEN:  Soft, nontender, nondistended. Bowel sounds present. No organomegaly or mass.  EXTREMITIES: No pedal edema, cyanosis, or clubbing.  NEUROLOGIC: Cranial nerves II through XII are intact. Muscle strength 5/5 in all extremities. Sensation intact. Gait not checked.  PSYCHIATRIC: The patient is alert and oriented x 3.  SKIN: No obvious rash, lesion, or ulcer.   DATA REVIEW:   CBC  Recent Labs Lab 08/07/15 0533  WBC 6.7  HGB 12.8  HCT 38.3  PLT 263    Chemistries   Recent Labs Lab 08/06/15 0615 08/06/15 0617 08/07/15 0533  NA 144  --  141  K 4.2  --  3.4*  CL 108  --  107  CO2 20*  --  26  GLUCOSE 109*  --  110*  BUN 14  --  18  CREATININE 0.58  --  0.63   CALCIUM 9.6  --  8.4*  MG  --  1.8  --   AST 23  --   --   ALT 17  --   --   ALKPHOS 93  --   --   BILITOT 0.4  --   --     Cardiac Enzymes  Recent Labs Lab 08/06/15 1728  TROPONINI 0.04*    Microbiology Results  Results for orders placed or performed during the hospital encounter of 09/06/09  MRSA PCR Screening     Status: Abnormal   Collection Time: 09/07/09  1:25 AM  Result Value Ref Range Status   MRSA by PCR (A) NEGATIVE Final    POSITIVE        The GeneXpert MRSA Assay (FDA approved for NASAL specimens only), is one component of a comprehensive MRSA colonization surveillance program. It is not intended to diagnose MRSA infection nor to guide or monitor treatment for MRSA infections. RESULT CALLED TO, READ BACK BY AND VERIFIED WITH: REESE D,RN [id number] 04.13.11 BUCKSON G  Culture, blood (routine x 2)     Status: None   Collection Time: 09/07/09  1:50 AM  Result Value Ref Range Status   Specimen Description BLOOD LEFT ARM  Final   Special Requests BOTTLES DRAWN AEROBIC ONLY 1CC  Final   Culture NO GROWTH 5 DAYS  Final   Report Status 09/13/2009 FINAL  Final  Culture, blood (routine x 2)     Status: None   Collection Time: 09/07/09  2:00 AM  Result Value Ref Range Status   Specimen Description BLOOD RIGHT HAND  Final   Special Requests BOTTLES DRAWN AEROBIC ONLY 3CC  Final   Culture NO GROWTH 5 DAYS  Final   Report Status 09/13/2009 FINAL  Final  Culture, bal-quantitative     Status: None   Collection Time: 09/08/09  3:42 PM  Result Value Ref Range Status   Specimen Description BRONCHIAL ALVEOLAR LAVAGE  Final   Special Requests NONE  Final   Gram Stain   Final    ABUNDANT WBC PRESENT, PREDOMINANTLY PMN RARE SQUAMOUS EPITHELIAL CELLS PRESENT NO ORGANISMS SEEN   Colony Count NO GROWTH  Final   Culture NO GROWTH 2 DAYS  Final   Report Status 09/11/2009 FINAL  Final  Urine culture     Status: None   Collection Time: 09/09/09  7:50 PM  Result Value Ref Range  Status   Specimen Description URINE, CATHETERIZED  Final   Special Requests NONE  Final   Colony Count NO GROWTH  Final   Culture NO GROWTH  Final   Report Status 09/11/2009 FINAL  Final  RADIOLOGY:  No results found.    Management plans discussed with the patient, family and they are in agreement.  CODE STATUS:  Code Status History    Date Active Date Inactive Code Status Order ID Comments User Context   08/06/2015 11:33 AM 08/07/2015  8:44 PM Full Code [id number]  Vaughan Basta, MD Inpatient      TOTAL TIME TAKING CARE OF THIS PATIENT: 35 minutes.    Vaughan Basta M.D on 08/09/2015 at 5:23 PM  Between 7am to 6pm - Pager - [phone]  After 6pm go to [email or web address] - password [email or web address] [facility]  Office  [phone]  CC: Primary care physician; BABAOFF, Caryl Bis, MD   Note: This dictation was prepared with Dragon dictation along with smaller phrase technology. Any transcriptional errors that result from this process are unintentional.

## 2015-08-15 ENCOUNTER — Ambulatory Visit (INDEPENDENT_AMBULATORY_CARE_PROVIDER_SITE_OTHER): Payer: PPO | Admitting: Cardiovascular Disease

## 2015-08-15 ENCOUNTER — Encounter: Payer: Self-pay | Admitting: Cardiovascular Disease

## 2015-08-15 VITALS — BP 110/60 | HR 102 | Ht 66.0 in | Wt 120.1 lb

## 2015-08-15 DIAGNOSIS — I4891 Unspecified atrial fibrillation: Secondary | ICD-10-CM

## 2015-08-15 DIAGNOSIS — R000 Tachycardia, unspecified: Secondary | ICD-10-CM | POA: Diagnosis not present

## 2015-08-15 DIAGNOSIS — J432 Centrilobular emphysema: Secondary | ICD-10-CM | POA: Diagnosis not present

## 2015-08-15 DIAGNOSIS — R319 Hematuria, unspecified: Secondary | ICD-10-CM

## 2015-08-15 DIAGNOSIS — I2510 Atherosclerotic heart disease of native coronary artery without angina pectoris: Secondary | ICD-10-CM | POA: Diagnosis not present

## 2015-08-15 MED ORDER — METOPROLOL SUCCINATE ER 25 MG PO TB24: 25.0000 mg | ORAL_TABLET | Freq: Every day | ORAL | Status: DC

## 2015-08-15 NOTE — Progress Notes (Signed)
Patient ID: Evelyn Willis, female    DOB: 12-17-1955, 60 y.o.   MRN: [id number]  HPI Comments: 60 y.o. female with h/o Lung cancer, radiation and chemotherapy at [facility], atherosclerosis on CT scan, long history of smoking, likely COPD, history of gastric ulcers requiring surgery (secondary to overuse of NSAIDs), who presented to [facility] last week with tachycardia, shortness of breath, found to have atrial fibrillation with RVR. She presents today for follow-up for her atrial fibrillation after discharge from the hospital. Prior to admission she had at least one prior episode of tachycardia, likely atrial fibrillation several days prior to admission. Notes indicate stent to coronary artery 10 years ago, no follow-up since that time  In the hospital she was treated with amiodarone, converted to normal sinus rhythm, started on anticoagulation In follow-up today, she reports feeling well She did have hematuria on eliquis 5 mg twice a day. She called our office and we recommended that she stop the blood thinner She did try one half pill but continued to have pink urine Otherwise she feels well with no symptoms of tachycardia  She has been able to quit smoking, now using nicotine patches  EKG on today's visit shows normal sinus rhythm with rate 95 bpm, no significant ST or T-wave changes  Other past medical history Symptoms started Saturday morning 08/06/2015. She woke at 3 AM with acute onset of tachycardia, felt her heart was pounding out of her chest. She called 911, notes indicated she received several doses of adenosine with no improvement of her arrhythmia. She remained with elevated heart rate. She was taken to the emergency room, started on amiodarone infusion  also started on heparin infusion. Converted shortly to normal sinus rhythm on the floor 24 hours ago.     Allergies  Allergen Reactions  . Aspirin Other (See Comments)    Pt gets bleeding ulcers when she takes this  medication Bleeding ulcers  . Nsaids Other (See Comments)    Other reaction(s): Other (See Comments) Cannot take due to significant peptic ulcer disease. Cannot take due to significant peptic ulcer disease.    Current Outpatient Prescriptions on File Prior to Visit  Medication Sig Dispense Refill  . albuterol (VENTOLIN HFA) 108 (90 Base) MCG/ACT inhaler Inhale 2 puffs into the lungs every 4 (four) hours as needed.    . ALPRAZolam (XANAX) 1 MG tablet Take 1 tablet by mouth daily.    Marland Kitchen amiodarone (PACERONE) 200 MG tablet Take 1 tablet (200 mg total) by mouth 2 (two) times daily. Start after 5 days of taking 400 mg twice daily. 60 tablet 0  . amitriptyline (ELAVIL) 75 MG tablet Take 75 mg by mouth at bedtime.    Marland Kitchen buPROPion (WELLBUTRIN XL) 150 MG 24 hr tablet Take 1 tablet by mouth daily.    Marland Kitchen HYDROcodone-acetaminophen (NORCO) 10-325 MG tablet Take 1 tablet by mouth every 4 (four) hours as needed.    . mirtazapine (REMERON) 15 MG tablet Take 15 mg by mouth at bedtime.    Marland Kitchen morphine (MS CONTIN) 15 MG 12 hr tablet Take 1 tablet by mouth 2 (two) times daily.    Marland Kitchen SPIRIVA HANDIHALER 18 MCG inhalation capsule Place 1 capsule into inhaler and inhale daily.     No current facility-administered medications on file prior to visit.    Past Medical History  Diagnosis Date  . Cancer ([place])     lung cancer, radiation, chemo  . Depression   . Multiple sclerosis ([place])   .  Coronary artery disease     said- stent was placed 10 yrs ago from now( 2017) in [place], [place]    History reviewed. No pertinent past surgical history.  Social History  reports that she has been smoking.  She does not have any smokeless tobacco history on file. She reports that she drinks alcohol.  Family History family history includes Atrial fibrillation in her mother; CAD in her mother. There is no history of Heart attack, Hypertension, or Stroke.    Review of Systems  Constitutional: Negative.   Respiratory: Negative.    Cardiovascular: Negative.   Gastrointestinal: Negative.   Musculoskeletal: Negative.   Neurological: Negative.   Hematological: Negative.   Psychiatric/Behavioral: Negative.     BP 110/60 mmHg  Pulse 102  Ht '5\' 6"'$  (1.676 m)  Wt 120 lb 1.9 oz (54.486 kg)  BMI 19.40 kg/m2  Physical Exam  Constitutional: She is oriented to person, place, and time. She appears well-developed and well-nourished.  HENT:  Head: Normocephalic.  Nose: Nose normal.  Mouth/Throat: Oropharynx is clear and moist.  Eyes: Conjunctivae are normal. Pupils are equal, round, and reactive to light.  Neck: Normal range of motion. Neck supple. No JVD present.  Cardiovascular: Normal rate, regular rhythm, normal heart sounds and intact distal pulses.  Exam reveals no gallop and no friction rub.   No murmur heard. Pulmonary/Chest: Effort normal. No respiratory distress. She has decreased breath sounds. She has no wheezes. She has no rales. She exhibits no tenderness.  Abdominal: Soft. Bowel sounds are normal. She exhibits no distension. There is no tenderness.  Musculoskeletal: Normal range of motion. She exhibits no edema or tenderness.  Lymphadenopathy:    She has no cervical adenopathy.  Neurological: She is alert and oriented to person, place, and time. Coordination normal.  Skin: Skin is warm and dry. No rash noted. No erythema.  Psychiatric: She has a normal mood and affect. Her behavior is normal. Judgment and thought content normal.

## 2015-08-15 NOTE — Assessment & Plan Note (Signed)
We'll try to obtain previous records, notes indicating history of coronary disease There is coronary calcification seen on CT scan We will request lipid panel, will likely need a statin

## 2015-08-15 NOTE — Assessment & Plan Note (Signed)
Maintaining normal sinus rhythm Heart rate elevated on exam today, sinus tachycardia Recommended she start metoprolol succinate 25 mg in the evening in addition with stay on amiodarone 200 mg twice a day for now. Unable to tolerate anticoagulation given hematuria. Once she has completed evaluation by urology, able to tolerate anticoagulation, we'll decrease amiodarone down to 200 mg daily. Plan was discussed with the patient

## 2015-08-15 NOTE — Patient Instructions (Addendum)
You are doing well.  Please start metoprolol once a day at dinner or before bed Stay on your other medications  Consider calling urology to workup the hemeturia/blood in the urine   Please call us if you have new issues that need to be addressed before your next appt.  Your physician wants you to follow-up in: 3 months.  You will receive a reminder letter in the mail two months in advance. If you don't receive a letter, please call our office to schedule the follow-up appointment.

## 2015-08-15 NOTE — Assessment & Plan Note (Signed)
We have recommended she discuss this with primary care Number provided to urology May need cystoscope Ideally would like to be able to restart anticoagulation given paroxysmal atrial fibrillation

## 2015-08-15 NOTE — Assessment & Plan Note (Signed)
We have encouraged her to continue to work on weaning her cigarettes and smoking cessation. She will continue to work on this and does not want any assistance with chantix.  

## 2015-08-24 ENCOUNTER — Telehealth: Payer: Self-pay | Admitting: Cardiovascular Disease

## 2015-08-24 NOTE — Telephone Encounter (Signed)
Pt called stating that we referred her to [facility]  But when pt called over there to make an appointment they did not have the referral They told her to call us so we can place one for patient.

## 2015-08-24 NOTE — Telephone Encounter (Signed)
Left detailed voicemail message letting patient know that I did not see any consult for neurology listed in the notes. Let message for her to call back if she had any further questions.

## 2015-08-31 ENCOUNTER — Telehealth: Payer: Self-pay | Admitting: Cardiovascular Disease

## 2015-08-31 NOTE — Telephone Encounter (Signed)
Pt calling stating she needs referral to urology Please advise.

## 2015-08-31 NOTE — Telephone Encounter (Signed)
Left detailed voicemail message for patient to call PCP in order to obtain referral to urology and if any further questions to call us back.

## 2015-09-05 ENCOUNTER — Other Ambulatory Visit: Payer: Self-pay

## 2015-09-05 DIAGNOSIS — R319 Hematuria, unspecified: Secondary | ICD-10-CM

## 2015-09-16 ENCOUNTER — Other Ambulatory Visit: Payer: Self-pay

## 2015-09-16 MED ORDER — AMIODARONE HCL 200 MG PO TABS
200.0000 mg | ORAL_TABLET | Freq: Two times a day (BID) | ORAL | Status: DC
Start: 2015-09-16 — End: 2015-12-20

## 2015-09-16 NOTE — Telephone Encounter (Signed)
Refill sent for amiodarone 200 mg one tablet twice a day.

## 2015-09-23 ENCOUNTER — Ambulatory Visit (INDEPENDENT_AMBULATORY_CARE_PROVIDER_SITE_OTHER): Payer: PPO | Admitting: Urology

## 2015-09-23 ENCOUNTER — Encounter: Payer: Self-pay | Admitting: Urology

## 2015-09-23 VITALS — BP 115/72 | HR 73 | Ht 66.0 in | Wt 122.0 lb

## 2015-09-23 DIAGNOSIS — R310 Gross hematuria: Secondary | ICD-10-CM | POA: Insufficient documentation

## 2015-09-23 LAB — URINALYSIS, COMPLETE
BILIRUBIN UA: NEGATIVE
Glucose, UA: NEGATIVE
Leukocytes, UA: NEGATIVE
NITRITE UA: NEGATIVE
PH UA: 5.5 (ref 5.0–7.5)
SPEC GRAV UA: 1.025 (ref 1.005–1.030)
UUROB: 2 mg/dL — AB (ref 0.2–1.0)

## 2015-09-23 LAB — MICROSCOPIC EXAMINATION
BACTERIA UA: NONE SEEN
EPITHELIAL CELLS (NON RENAL): NONE SEEN /HPF (ref 0–10)
RBC, UA: >30 /hpf — AB (ref 0–?)
WBC UA: NONE SEEN /HPF (ref 0–?)

## 2015-09-23 NOTE — Progress Notes (Signed)
09/23/2015 12:33 PM   Evelyn Willis June 01, 1955 [id number]  Referring provider: Derinda Late, MD [phone] [address] [facility] [place], [place] [address]  Chief Complaint  Patient presents with  . Hematuria    referred by Dr. Carmin Muskrat    HPI: Patient is a 60 -year-old Caucasian female who presents today as a referral from their PCP, Dr. Carmin Muskrat, for gross hematuria.    She states that a month ago she noted gross hematuria when she takes the medication Toprol. She had been recently diagnosed with atrial fibrillation.  She did spontaneously passed a stone approximately 20 years ago.  She does not have a prior history of recurrent urinary tract infections, trauma to the genitourinary tract or malignancies of the genitourinary tract.   She does not have a family medical history of nephrolithiasis, malignancies of the genitourinary tract or hematuria.   Today, she is having hesitancy.  She is not having symptoms of frequent urination, urgency, dysuria, nocturia, incontinence, intermittency, straining to urinate or a weak urinary stream.  Her UA today demonstrates greater than 30 RBCs per high-powered field.  She is not experiencing any suprapubic pain, abdominal pain or flank pain. She denies any recent fevers, chills, nausea or vomiting.   She did undergo a CT of the chest, abdomen and pelvis with contrast in November 2016.  She was found to have scattered subcentimeter hypodense renal lesions and bilateral nonobstructing renal calculi.    She was a smoker with a 40 pack year history.  Quit one month ago.  She is not exposed to secondhand smoke.  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    PMH: Past Medical History  Diagnosis Date  . Cancer ([place])     lung cancer, radiation, chemo  . Depression   . Multiple sclerosis ([place])   . Coronary artery disease     said- stent was placed 10 yrs ago from now( 2017) in [place], [place]  . Hematuria     . Atrial fibrillation [facility])     Surgical History: Past Surgical History  Procedure Laterality Date  . Stomach ulcer removal  2011  . Total hip arthroplasty  2011    Home Medications:    Medication List       This list is accurate as of: 09/23/15 12:33 PM.  Always use your most recent med list.               ALPRAZolam 1 MG tablet  Commonly known as:  XANAX  Take 1 tablet by mouth daily. Reported on 09/23/2015     amiodarone 200 MG tablet  Commonly known as:  PACERONE  Take 1 tablet (200 mg total) by mouth 2 (two) times daily.     amitriptyline 75 MG tablet  Commonly known as:  ELAVIL  Take 75 mg by mouth at bedtime.     amphetamine-dextroamphetamine 20 MG tablet  Commonly known as:  ADDERALL  Take 20 mg by mouth daily. Reported on 09/23/2015     buPROPion 150 MG 24 hr tablet  Commonly known as:  WELLBUTRIN XL  Take 1 tablet by mouth daily.     HYDROcodone-acetaminophen 10-325 MG tablet  Commonly known as:  NORCO  Take 1 tablet by mouth every 4 (four) hours as needed.     metoprolol succinate 25 MG 24 hr tablet  Commonly known as:  TOPROL XL  Take 1 tablet (25 mg total) by mouth daily.  mirtazapine 15 MG tablet  Commonly known as:  REMERON  Take 15 mg by mouth at bedtime.     morphine 15 MG 12 hr tablet  Commonly known as:  MS CONTIN  Take 1 tablet by mouth 2 (two) times daily.     RA NICOTINE 21 mg/24hr patch  Generic drug:  nicotine  Place onto the skin.     SPIRIVA HANDIHALER 18 MCG inhalation capsule  Generic drug:  tiotropium  Place 1 capsule into inhaler and inhale daily.     VENTOLIN HFA 108 (90 Base) MCG/ACT inhaler  Generic drug:  albuterol  Inhale 2 puffs into the lungs every 4 (four) hours as needed.        Allergies:  Allergies  Allergen Reactions  . Aspirin Other (See Comments)    Pt gets bleeding ulcers when she takes this medication Bleeding ulcers  . Nsaids Other (See Comments)    Other reaction(s): Other (See  Comments) Cannot take due to significant peptic ulcer disease. Cannot take due to significant peptic ulcer disease.    Family History: Family History  Problem Relation Age of Onset  . CAD Mother   . Atrial fibrillation Mother   . Heart attack Neg Hx   . Hypertension Neg Hx   . Stroke Neg Hx   . Kidney disease Neg Hx     Social History:  reports that she has quit smoking. She does not have any smokeless tobacco history on file. She reports that she drinks alcohol. She reports that she does not use illicit drugs.  ROS: UROLOGY Frequent Urination?: No Hard to postpone urination?: No Burning/pain with urination?: No Get up at night to urinate?: No Leakage of urine?: No Urine stream starts and stops?: No Trouble starting stream?: Yes Do you have to strain to urinate?: No Blood in urine?: Yes Urinary tract infection?: No Sexually transmitted disease?: No Injury to kidneys or bladder?: No Painful intercourse?: No Weak stream?: No Currently pregnant?: No Vaginal bleeding?: No Last menstrual period?: n  Gastrointestinal Nausea?: No Vomiting?: No Indigestion/heartburn?: No Diarrhea?: No Constipation?: No  Constitutional Fever: No Night sweats?: No Weight loss?: No Fatigue?: No  Skin Skin rash/lesions?: No Itching?: No  Eyes Blurred vision?: No Double vision?: No  Ears/Nose/Throat Sore throat?: No Sinus problems?: No  Hematologic/Lymphatic Swollen glands?: No Easy bruising?: No  Cardiovascular Leg swelling?: No Chest pain?: No  Respiratory Cough?: No Shortness of breath?: Yes  Endocrine Excessive thirst?: No  Musculoskeletal Back pain?: No Joint pain?: No  Neurological Headaches?: No Dizziness?: No  Psychologic Depression?: No Anxiety?: No  Physical Exam: BP 115/72 mmHg  Pulse 73  Ht '5\' 6"'$  (1.676 m)  Wt 122 lb (55.339 kg)  BMI 19.70 kg/m2  Constitutional: Well nourished. Alert and oriented, No acute distress. HEENT: [place] AT, moist  mucus membranes. Trachea midline, no masses. Cardiovascular: No clubbing, cyanosis, or edema. Respiratory: Normal respiratory effort, no increased work of breathing. GI: Abdomen is soft, non tender, non distended, no abdominal masses. Liver and spleen not palpable.  No hernias appreciated.  Stool sample for occult testing is not indicated.   GU: No CVA tenderness.  No bladder fullness or masses.  Atrophic external genitalia, normal pubic hair distribution, no lesions.  Normal urethral meatus, no lesions, no prolapse, no discharge.   No urethral masses, tenderness and/or tenderness. No bladder fullness, tenderness or masses. Normal vagina mucosa, good estrogen effect, no discharge, no lesions, good pelvic support, no cystocele or rectocele noted.  No cervical motion  tenderness.  Uterus is freely mobile and non-fixed.  No adnexal/parametria masses or tenderness noted.  Anus and perineum are without rashes or lesions.    Skin: No rashes, bruises or suspicious lesions. Lymph: No cervical or inguinal adenopathy. Neurologic: Grossly intact, no focal deficits, moving all 4 extremities. Psychiatric: Normal mood and affect.  Laboratory Data: Lab Results  Component Value Date   WBC 6.7 08/07/2015   HGB 12.8 08/07/2015   HCT 38.3 08/07/2015   MCV 95.5 08/07/2015   PLT 263 08/07/2015    Lab Results  Component Value Date   CREATININE 0.63 08/07/2015    Lab Results  Component Value Date   TSH 1.310 08/06/2015     Lab Results  Component Value Date   AST 23 08/06/2015   Lab Results  Component Value Date   ALT 17 08/06/2015     Urinalysis Results for orders placed or performed in visit on 09/23/15  CULTURE, URINE COMPREHENSIVE  Result Value Ref Range   Urine Culture, Comprehensive Final report    Result 1 Comment   Microscopic Examination  Result Value Ref Range   WBC, UA None seen 0 -  5 /hpf   RBC, UA >30 (A) 0 -  2 /hpf   Epithelial Cells (non renal) None seen 0 - 10 /hpf    Bacteria, UA None seen None seen/Few  Urinalysis, Complete  Result Value Ref Range   Specific Gravity, UA 1.025 1.005 - 1.030   pH, UA 5.5 5.0 - 7.5   Color, UA Amber (A) Yellow   Appearance Ur Cloudy (A) Clear   Leukocytes, UA Negative Negative   Protein, UA 2+ (A) Negative/Trace   Glucose, UA Negative Negative   Ketones, UA 1+ (A) Negative   RBC, UA 3+ (A) Negative   Bilirubin, UA Negative Negative   Urobilinogen, Ur 2.0 (H) 0.2 - 1.0 mg/dL   Nitrite, UA Negative Negative   Microscopic Examination See below:   BUN+Creat  Result Value Ref Range   BUN 15 8 - 27 mg/dL   Creatinine, Ser 0.71 0.57 - 1.00 mg/dL   GFR calc non Af Amer 92 >59 mL/min/1.73   GFR calc Af Amer 106 >59 mL/min/1.73   BUN/Creatinine Ratio 21 12 - 28    Pertinent Imaging: 1.  Similar appearance of a left hilar mass with soft tissue filling the left lower lobe bronchial tree, compatible with primary tumor. Improving associated postobstructive pneumonitis/pneumonia. 2.  Unchanged mediastinal adenopathy, including an index 10 mm AP window lymph node. 3.  Intrahepatic biliary duct dilation as well as dilation of the common bile duct and main pancreatic duct may be physiologic in this patient status post cholecystectomy, with no obstructing lesion or filling defect noted. Correlate with LFTs if there is concern for acute biliary pathology. 4.  Subcentimeter bilateral pulmonary nodules are nonspecific and below PET resolution. Recommend attention on follow-up. 5.  Subcentimeter lesion in the left hepatic lobe with a feeding vessel, which is indeterminate and could represent a metastatic lesion. Recommend attention on follow-up. 6.  Nonobstructing 5 mm proximal right ureteral calculus.   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 Multi-planar reformatted images were generated for additional analysis. Nongated technique limits cardiac detail.  [facility] and its affiliates are committed to minimizing radiation dose to patients while maintaining necessary diagnostic image quality. All CT scans are therefore performed using "As Low As Reasonably Achievable (ALARA)" protocols with either manual or automated exposure controls calibrated to the age and size of each patient.  FINDINGS:  CHEST: Thoracic inlet/central airways: Thyroid normal. Airway patent.    .   Mediastinum/hila/axilla: Similar appearance of a left hilar mass with soft tissue filling the left lower lobe bronchial tree. AP window lymph node measures 10 mm, similar to prior (series 2, image 47). No new lymph node enlargement. Small fluid level in the distal esophagus. Heart/vessel: Normal heart size. No pericardial effusion. Atherosclerosis involving multiple arterial structures, including the coronary arteries. No evidence of central pulmonary embolism.      Lungs/pleura: Improving postobstructive pneumonia/pneumonitis in the left lower lobe. Multiple subcentimeter left upper lobe groundglass nodules. Irregular nodule in the right upper lobe (series 3, image 73) may represent an area of nodular scarring. No pleural effusion or pneumothorax.      ABDOMEN AND PELVIS:  Liver: Hypodense 8 mm nodule in the left hepatic lobe is not clearly demonstrated on prior studies (series 2, image 108) and does not measure fluid density. Gallbladder/biliary: Mild intrahepatic biliary duct dilation and dilation of the common bile duct to 12 mm, which may be physiologic in this patient status post cholecystectomy. Spleen: Within normal limits. Pancreas: Mild diffuse dilation of the main pancreatic duct, measuring 4-5  mm. Adrenals: Within normal limits. Kidneys: Scattered subcentimeter hypodense renal lesions, which are too small to characterize. Bilateral nonobstructing renal calculi, measuring up to 4 mm left lower pole. 5 mm nonobstructing proximal right ureteral calculus (series 2, image 149). Symmetric renal enhancement without hydronephrosis. Peritoneum/mesentery/extraperitoneum: Multiple small ventral abdominal wall hernias containing mesenteric fat and vessels. GI tract: No bowel obstruction. Moderate colonic stool burden. Vascular: Atherosclerotic calcification of the abdominal aorta and its branches without aneurysmal dilation. Bladder: Within normal limits. Reproductive system: Within normal limits. MSK: Multilevel degenerative changes of the visualized spine. Grade 1 anterolisthesis of L4 on L5 without pars defects. No aggressive appearing lytic or sclerotic osseous lesions. Left total hip replacement.         Assessment & Plan:    1. Gross hematuria:   Explained to patient the causes of blood in the urine are as follows: stones,  UTI's, damage to the urinary tract and/or cancer.  It is explained to the patient that they will be scheduled for a CT Urogram with contrast material and that in rare instances, an allergic reaction can be serious and even life threatening with the injection of contrast material.   The patient denies any allergies to contrast, iodine and/or seafood and is not taking metformin.  - Urinalysis, Complete - CULTURE, URINE COMPREHENSIVE - BUN+Creat   Return for CT Urogram report.  These notes generated with voice recognition software. I apologize for typographical errors.  Zara Council, [facility] [address] [place], [place] [address] [phone]

## 2015-09-24 LAB — BUN+CREAT
BUN / CREAT RATIO: 21 (ref 12–28)
BUN: 15 mg/dL (ref 8–27)
Creatinine, Ser: 0.71 mg/dL (ref 0.57–1.00)
GFR calc non Af Amer: 92 mL/min/{1.73_m2} (ref >59)
GFR, EST AFRICAN AMERICAN: 106 mL/min/{1.73_m2} (ref >59)

## 2015-09-25 LAB — CULTURE, URINE COMPREHENSIVE

## 2015-09-29 ENCOUNTER — Telehealth: Payer: Self-pay | Admitting: Urology

## 2015-09-29 NOTE — Telephone Encounter (Signed)
Would you send my note to Dr. Baldemar Lenis?

## 2015-09-29 NOTE — Telephone Encounter (Signed)
done

## 2015-10-12 ENCOUNTER — Ambulatory Visit
Admission: RE | Admit: 2015-10-12 | Discharge: 2015-10-12 | Disposition: A | Discharge status: Home / Self Care | Payer: PPO | Source: Ambulatory Visit | Attending: Urology | Admitting: Urology

## 2015-10-12 ENCOUNTER — Telehealth: Payer: Self-pay

## 2015-10-12 DIAGNOSIS — N200 Calculus of kidney: Secondary | ICD-10-CM | POA: Insufficient documentation

## 2015-10-12 DIAGNOSIS — N201 Calculus of ureter: Secondary | ICD-10-CM | POA: Insufficient documentation

## 2015-10-12 DIAGNOSIS — J90 Pleural effusion, not elsewhere classified: Secondary | ICD-10-CM | POA: Insufficient documentation

## 2015-10-12 DIAGNOSIS — R310 Gross hematuria: Secondary | ICD-10-CM

## 2015-10-12 MED ORDER — IOPAMIDOL (ISOVUE-300) INJECTION 61%
125.0000 mL | Freq: Once | INTRAVENOUS | Status: AC | PRN
  Administered 2015-10-12: 125 mL via INTRAVENOUS

## 2015-10-12 NOTE — Telephone Encounter (Signed)
[facility] called giving an oral report of CT results.

## 2015-10-14 ENCOUNTER — Ambulatory Visit: Payer: Self-pay | Admitting: Urology

## 2015-10-14 NOTE — Telephone Encounter (Signed)
I left word with Evelyn Willis to call back regarding her CT report.  She does have an obstructing stone which may be the cause of her nausea/vomiting.  If she does not improve or starts having high fevers, I would instruct her to go to the emergency room for possible emergent stent placement.  Otherwise, she should call back on Monday to discuss surgery to remove the stone.

## 2015-10-14 NOTE — Telephone Encounter (Signed)
Patient cancelled her appointment today.  She is not feeling well (vomiting).  She inquired as to whether we can call her with the results of her CT scan or if she needs to come in for an appointment.  Please advise.

## 2015-10-14 NOTE — Telephone Encounter (Signed)
Patient called back and I advised her of Shannon's message below.  She expressed understanding.  I scheduled her a follow up appointment on Monday at 2:15pm.

## 2015-10-17 ENCOUNTER — Ambulatory Visit
Admission: RE | Admit: 2015-10-17 | Discharge: 2015-10-17 | Disposition: A | Discharge status: Home / Self Care | Payer: PPO | Source: Ambulatory Visit | Attending: Urology | Admitting: Urology

## 2015-10-17 ENCOUNTER — Ambulatory Visit (INDEPENDENT_AMBULATORY_CARE_PROVIDER_SITE_OTHER): Payer: PPO | Admitting: Urology

## 2015-10-17 ENCOUNTER — Encounter: Payer: Self-pay | Admitting: Urology

## 2015-10-17 VITALS — BP 106/66 | HR 101 | Ht 67.0 in | Wt 118.3 lb

## 2015-10-17 DIAGNOSIS — N201 Calculus of ureter: Secondary | ICD-10-CM | POA: Diagnosis present

## 2015-10-17 DIAGNOSIS — R310 Gross hematuria: Secondary | ICD-10-CM | POA: Diagnosis not present

## 2015-10-17 DIAGNOSIS — N132 Hydronephrosis with renal and ureteral calculous obstruction: Secondary | ICD-10-CM

## 2015-10-17 LAB — MICROSCOPIC EXAMINATION
Bacteria, UA: NONE SEEN
Epithelial Cells (non renal): >10 /hpf — AB (ref 0–10)

## 2015-10-17 LAB — URINALYSIS, COMPLETE
Bilirubin, UA: NEGATIVE
Glucose, UA: NEGATIVE
Ketones, UA: NEGATIVE
NITRITE UA: NEGATIVE
Protein, UA: NEGATIVE
Specific Gravity, UA: 1.025 (ref 1.005–1.030)
UUROB: 1 mg/dL (ref 0.2–1.0)
pH, UA: 5.5 (ref 5.0–7.5)

## 2015-10-17 NOTE — Progress Notes (Signed)
12:21 PM   Evelyn Willis 1955-12-17 [id number]  Referring provider: Derinda Late, MD [phone] [place], [place] [address]  Chief Complaint  Patient presents with  . Results    CT and discuss surgery    HPI: Patient is a 60 year old Caucasian female who presents today to discuss the results of her CT Urogram.    Background history Patient was a referral from their PCP, Dr. Carmin Muskrat, for gross hematuria.  She states that a month ago she noted gross hematuria when she takes the medication Toprol. She had been recently diagnosed with atrial fibrillation.  She did spontaneously passed a stone approximately 20 years ago.  She does not have a prior history of recurrent urinary tract infections, trauma to the genitourinary tract or malignancies of the genitourinary tract.   She does not have a family medical history of nephrolithiasis, malignancies of the genitourinary tract or hematuria.  She did undergo a CT of the chest, abdomen and pelvis with contrast in November 2016.  She was found to have scattered subcentimeter hypodense renal lesions and bilateral nonobstructing renal calculi.  She was a smoker with a 40 pack year history.  Quit one month ago.  She is not exposed to secondhand smoke.  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   Today, she is having hesitancy.  She is not having symptoms of frequent urination, urgency, dysuria, nocturia, incontinence, intermittency, straining to urinate or a weak urinary stream.  Her UA today demonstrates greater than 30 RBCs per high-powered field.  She is not experiencing any suprapubic pain, abdominal pain or flank pain. She denies any recent fevers, chills, nausea or vomiting.   CT Urogram completed on 10/12/2015 noted obstructing calculus within the distal RIGHT ureter approximately 6 cm from the vesicoureteral junction.  Bilateral nephrolithiasis.  Chronic dilatation of the intra and  extrahepatic bile ducts to the level of the ampulla is likely benign dilatation. Recommend correlation with bilirubin levels. Dense LEFT basilar atelectasis and pleural effusion.  I have personally reviewed the films with the patient.    PMH: Past Medical History  Diagnosis Date  . Depression   . Multiple sclerosis ([place])   . Coronary artery disease     said- stent was placed 10 yrs ago from now( 2017) in [place], [place]  . Hematuria   . Atrial fibrillation ([place])   . Cancer [facility]) 2016    Left lung cancer, radiation, chemo    Surgical History: Past Surgical History  Procedure Laterality Date  . Stomach ulcer removal  2011  . Total hip arthroplasty  2011    Home Medications:    Medication List       This list is accurate as of: 10/17/15 11:59 PM.  Always use your most recent med list.               ALPRAZolam 1 MG tablet  Commonly known as:  XANAX  Take 1 tablet by mouth daily. Reported on 10/17/2015     amiodarone 200 MG tablet  Commonly known as:  PACERONE  Take 1 tablet (200 mg total) by mouth 2 (two) times daily.     amitriptyline 75 MG tablet  Commonly known as:  ELAVIL  Take 75 mg by mouth at bedtime.     amphetamine-dextroamphetamine 20 MG tablet  Commonly known as:  ADDERALL  Take 20 mg by mouth daily. Reported on 09/23/2015     buPROPion 150  MG 24 hr tablet  Commonly known as:  WELLBUTRIN XL  Take 1 tablet by mouth daily.     doxycycline 100 MG tablet  Commonly known as:  VIBRA-TABS  Take by mouth.     HYDROcodone-acetaminophen 10-325 MG tablet  Commonly known as:  NORCO  Take 1 tablet by mouth every 4 (four) hours as needed.     metoprolol succinate 25 MG 24 hr tablet  Commonly known as:  TOPROL XL  Take 1 tablet (25 mg total) by mouth daily.     mirtazapine 15 MG tablet  Commonly known as:  REMERON  Take 15 mg by mouth at bedtime.     morphine 15 MG 12 hr tablet  Commonly known as:  MS CONTIN  Take 1 tablet by mouth 2 (two) times daily.      predniSONE 20 MG tablet  Commonly known as:  DELTASONE  Take by mouth.     RA NICOTINE 21 mg/24hr patch  Generic drug:  nicotine  Place onto the skin.     SPIRIVA HANDIHALER 18 MCG inhalation capsule  Generic drug:  tiotropium  Place 1 capsule into inhaler and inhale daily.     VENTOLIN HFA 108 (90 Base) MCG/ACT inhaler  Generic drug:  albuterol  Inhale 2 puffs into the lungs every 4 (four) hours as needed.        Allergies:  Allergies  Allergen Reactions  . Aspirin Other (See Comments)    Pt gets bleeding ulcers when she takes this medication Bleeding ulcers  . Nsaids Other (See Comments)    Other reaction(s): Other (See Comments) Cannot take due to significant peptic ulcer disease. Cannot take due to significant peptic ulcer disease.    Family History: Family History  Problem Relation Age of Onset  . CAD Mother   . Atrial fibrillation Mother   . Heart attack Neg Hx   . Hypertension Neg Hx   . Stroke Neg Hx   . Kidney disease Neg Hx     Social History:  reports that she has quit smoking. She does not have any smokeless tobacco history on file. She reports that she drinks alcohol. She reports that she does not use illicit drugs.  ROS: UROLOGY Frequent Urination?: No Hard to postpone urination?: No Burning/pain with urination?: No Get up at night to urinate?: No Leakage of urine?: No Urine stream starts and stops?: No Trouble starting stream?: No Do you have to strain to urinate?: No Blood in urine?: No Urinary tract infection?: No Sexually transmitted disease?: No Injury to kidneys or bladder?: No Painful intercourse?: No Weak stream?: No Currently pregnant?: No Vaginal bleeding?: No Last menstrual period?: n  Gastrointestinal Nausea?: No Vomiting?: No Indigestion/heartburn?: No Diarrhea?: No Constipation?: No  Constitutional Fever: No Night sweats?: No Weight loss?: No Fatigue?: No  Skin Skin rash/lesions?: No Itching?:  No  Eyes Blurred vision?: No Double vision?: No  Ears/Nose/Throat Sore throat?: No Sinus problems?: No  Hematologic/Lymphatic Swollen glands?: No Easy bruising?: No  Cardiovascular Leg swelling?: No Chest pain?: No  Respiratory Cough?: No Shortness of breath?: No  Endocrine Excessive thirst?: No  Musculoskeletal Back pain?: No Joint pain?: No  Neurological Headaches?: No Dizziness?: No  Psychologic Depression?: No Anxiety?: No  Physical Exam: BP 106/66 mmHg  Pulse 101  Ht _0  (1.702 m)  Wt 118 lb 4.8 oz (53.661 kg)  BMI 18.52 kg/m2  Constitutional: Well nourished. Alert and oriented, No acute distress. HEENT: [place] AT, moist mucus membranes. Trachea midline,  no masses. Cardiovascular: No clubbing, cyanosis, or edema. Respiratory: Normal respiratory effort, no increased work of breathing. GI: Abdomen is soft, non tender, non distended, no abdominal masses. Liver and spleen not palpable.  No hernias appreciated.  Stool sample for occult testing is not indicated.   GU: No CVA tenderness.  No bladder fullness or masses.  Atrophic external genitalia, normal pubic hair distribution, no lesions.  Normal urethral meatus, no lesions, no prolapse, no discharge.   No urethral masses, tenderness and/or tenderness. No bladder fullness, tenderness or masses. Normal vagina mucosa, good estrogen effect, no discharge, no lesions, good pelvic support, no cystocele or rectocele noted.  No cervical motion tenderness.  Uterus is freely mobile and non-fixed.  No adnexal/parametria masses or tenderness noted.  Anus and perineum are without rashes or lesions.    Skin: No rashes, bruises or suspicious lesions. Lymph: No cervical or inguinal adenopathy. Neurologic: Grossly intact, no focal deficits, moving all 4 extremities. Psychiatric: Normal mood and affect.  Laboratory Data: Lab Results  Component Value Date   WBC 6.7 08/07/2015   HGB 12.8 08/07/2015   HCT 38.3 08/07/2015   MCV  95.5 08/07/2015   PLT 263 08/07/2015    Lab Results  Component Value Date   CREATININE 0.71 09/23/2015    Lab Results  Component Value Date   TSH 1.310 08/06/2015     Lab Results  Component Value Date   AST 23 08/06/2015   Lab Results  Component Value Date   ALT 17 08/06/2015     Urinalysis Results for orders placed or performed in visit on 10/17/15  Microscopic Examination  Result Value Ref Range   WBC, UA 0-5 0 -  5 /hpf   RBC, UA >30 (A) 0 -  2 /hpf   Epithelial Cells (non renal) >10 (A) 0 - 10 /hpf   Renal Epithel, UA 0-10 (A) None seen /hpf   Bacteria, UA None seen None seen/Few  Urinalysis, Complete  Result Value Ref Range   Specific Gravity, UA 1.025 1.005 - 1.030   pH, UA 5.5 5.0 - 7.5   Color, UA Yellow Yellow   Appearance Ur Cloudy (A) Clear   Leukocytes, UA Trace (A) Negative   Protein, UA Negative Negative/Trace   Glucose, UA Negative Negative   Ketones, UA Negative Negative   RBC, UA 3+ (A) Negative   Bilirubin, UA Negative Negative   Urobilinogen, Ur 1.0 0.2 - 1.0 mg/dL   Nitrite, UA Negative Negative   Microscopic Examination See below:     Pertinent Imaging: CLINICAL DATA: Gross hematuria. Intermittent hematuria since March 2017. Taking beta blocker. History of gastric ulceration. Additional history of lung cancer with radiation chemotherapy  EXAM: CT ABDOMEN AND PELVIS WITHOUT AND WITH CONTRAST  TECHNIQUE: Multidetector CT imaging of the abdomen and pelvis was performed following the standard protocol before and following the bolus administration of intravenous contrast.  CONTRAST: 169m ISOVUE-300 IOPAMIDOL (ISOVUE-300) INJECTION 61%  COMPARISON: CT 08/09/2009  FINDINGS: Lower chest: There is a LEFT pleural effusion and dense LEFT basilar atelectasis. RIGHT lung base is clear.  Hepatobiliary: There is chronic intra and extrahepatic biliary duct dilatation. Postcholecystectomy. The common bile duct measures 10  mm through the pancreatic head. There is mild dilatation of the pancreatic duct to 4 mm. These findings are similar to the slight progressed from CT of 08/10/1998 11. No obstructing lesions identified. No pancreatic atrophy or inflammation.  Pancreas: Again the pancreatic duct is dilated to 4 mm increased from 2 mm  on comparison exam were the duct was prominent. The pancreatic head lesion identified. No atrophy.  Spleen: Normal spleen  Adrenals/urinary tract: Adrenal glands are normal.  There is hydronephrosis and hydroureter of the RIGHT renal collecting system. This is secondary to an obstructing calculus in the distal RIGHT ureter measuring 4 mm (image 61, series 4) this distal RIGHT ureteral calculus is faintly evident on the CT topogram. This calculus is approximately 6 cm from the vesicoureteral junction at the level of the inferior aspect the SI joints.  There are 4 additional nonobstructing calculi in the RIGHT kidney. One 3 mm nonobstructing calculi in the LEFT kidney. No LEFT ureterolithiasis or obstructive uropathy.  There is no enhancing renal lesion. No bladder calculi. There is significant streak artifact from the prosthetic on the LEFT no  Stomach/Bowel: Stomach, small bowel, appendix, and cecum are normal. The colon and rectosigmoid colon are normal.  Vascular/Lymphatic: Abdominal aorta is normal caliber. There is no retroperitoneal or periportal lymphadenopathy. No pelvic lymphadenopathy.  Reproductive: Uterus poorly evaluated.  Other: No free fluid.  Musculoskeletal: No aggressive osseous lesion.  IMPRESSION: 1. Obstructing calculus within the distal RIGHT ureter approximately 6 cm from the vesicoureteral junction. 2. Bilateral nephrolithiasis. 3. Chronic dilatation of the intra and extrahepatic bile ducts to the level of the ampulla is likely benign dilatation. Recommend correlation with bilirubin levels. 4. Dense LEFT basilar  atelectasis and pleural effusion. These results will be called to the ordering clinician or representative by the Radiologist Assistant, and communication documented in the PACS or zVision Dashboard.   Electronically Signed  By: Suzy Bouchard M.D.  On: 10/12/2015 15:51 CLINICAL DATA: 60 year old female -evaluate right ureteral calculus.  EXAM: ABDOMEN - 1 VIEW  COMPARISON: 10/12/2015 CT  FINDINGS: A 6 mm calcification overlying the mid -distal right ureter is again identified compatible with known right ureteral calculus. There are adjacent vascular calcifications noted.  The bowel gas pattern is unremarkable.  Cholecystectomy clips and right hip arthroplasty noted.  IMPRESSION: 6 mm mid -distal right ureteral calculus again identified. Adjacent vascular calcifications noted.   Electronically Signed  By: Margarette Canada M.D.  On: 10/17/2015 16:49   Assessment & Plan:    Patient to undergo right ESWL for definitive treatment of a 6 mm distal ureteral calculus.    1. Right distal ureteral calculus:   Patient was found to have a 6 mm mid-distal right ureteral calculus.  We discussed treatment options, such as MET, ESWL and URS/LL/ureteral stent placement.  She is not excited about going under general anesthesia, so she would prefer right ESWL for definitive treatment for her distal ureteral stone.   Her stone is 550 HU and the skin to stone distance is 10.5 cm.  The effectiveness of the treatment should reach 88%.  I did advise her that if the stone would not pass after lithotripsy, she may have to undergo a procedure that would require general anesthesia.  The risks of ESWL are explained to the patient, such as: bleeding, infection, adjacent tissue/organ damage, ineffective/incomplete stone fragmentation and [place].    2. Right hydronephrosis:   Patient was found to have right hydronephrosis due to an ureteral stone.  A RUS will be obtained one month  after she has underwent ESWL for the ureteral stone to ensure the hydronephrosis has resolved.     3. Gross hematuria:   We will continue to monitor the patient's UA after the treatment/passage of the stone to ensure the hematuria has resolved.  If hematuria persists, we will  complete the hematuria workup with cystoscopy if appropriate.   - Urinalysis, Complete - CULTURE, URINE COMPREHENSIVE    Return for right ESWL.  These notes generated with voice recognition software. I apologize for typographical errors.  Zara Council, [facility] [address], [place], [place] [address] [phone]

## 2015-10-18 ENCOUNTER — Telehealth: Payer: Self-pay | Admitting: Cardiovascular Disease

## 2015-10-18 NOTE — Telephone Encounter (Signed)
Received cardiac clearance request for pt to proceed w/ extracorporeal shockwave lithotripsy w/ Dr. Hollice Espy.  Procedure has not been scheduled yet. Placed on Dr. Donivan Scull desk for review.

## 2015-10-19 LAB — CULTURE, URINE COMPREHENSIVE

## 2015-10-20 ENCOUNTER — Telehealth: Payer: Self-pay

## 2015-10-20 ENCOUNTER — Other Ambulatory Visit: Payer: Self-pay

## 2015-10-20 DIAGNOSIS — N390 Urinary tract infection, site not specified: Secondary | ICD-10-CM

## 2015-10-20 DIAGNOSIS — N400 Enlarged prostate without lower urinary tract symptoms: Secondary | ICD-10-CM

## 2015-10-20 MED ORDER — AMOXICILLIN-POT CLAVULANATE 875-125 MG PO TABS: 1.0000 | ORAL_TABLET | Freq: Two times a day (BID) | ORAL | Status: DC

## 2015-10-20 NOTE — Telephone Encounter (Signed)
Pt is scheduled for ESWL on 10/27/15. Please advise.

## 2015-10-20 NOTE — Telephone Encounter (Signed)
Acceptable risk for urologic procedure She is not taking anticoagulation based on notes from her last clinic visit No further testing needed

## 2015-10-20 NOTE — Telephone Encounter (Signed)
-----   Message from Nori Riis, PA-C sent at 10/20/2015  1:55 PM EDT ----- Patient has a +UCx.  They need to start Augmentin 875/125 mg one tablet twice daily for seven days.  She is having ESWL on the 1st.

## 2015-10-20 NOTE — Telephone Encounter (Signed)
LMOM-medication sent to pharmacy 

## 2015-10-21 NOTE — Telephone Encounter (Signed)
LMOM

## 2015-10-22 ENCOUNTER — Emergency Department: Payer: PPO

## 2015-10-22 ENCOUNTER — Inpatient Hospital Stay
Admission: EM | Admit: 2015-10-22 | Discharge: 2015-10-25 | DRG: 871 | Disposition: A | Discharge status: Home / Self Care | Payer: PPO | Attending: Internal Medicine | Admitting: Internal Medicine

## 2015-10-22 ENCOUNTER — Encounter: Payer: Self-pay | Admitting: Radiology

## 2015-10-22 DIAGNOSIS — Z96649 Presence of unspecified artificial hip joint: Secondary | ICD-10-CM | POA: Diagnosis present

## 2015-10-22 DIAGNOSIS — J90 Pleural effusion, not elsewhere classified: Secondary | ICD-10-CM | POA: Diagnosis not present

## 2015-10-22 DIAGNOSIS — R000 Tachycardia, unspecified: Secondary | ICD-10-CM | POA: Diagnosis present

## 2015-10-22 DIAGNOSIS — J189 Pneumonia, unspecified organism: Secondary | ICD-10-CM | POA: Diagnosis present

## 2015-10-22 DIAGNOSIS — G8929 Other chronic pain: Secondary | ICD-10-CM | POA: Diagnosis present

## 2015-10-22 DIAGNOSIS — F17210 Nicotine dependence, cigarettes, uncomplicated: Secondary | ICD-10-CM | POA: Diagnosis present

## 2015-10-22 DIAGNOSIS — J440 Chronic obstructive pulmonary disease with acute lower respiratory infection: Secondary | ICD-10-CM | POA: Diagnosis present

## 2015-10-22 DIAGNOSIS — Z8711 Personal history of peptic ulcer disease: Secondary | ICD-10-CM

## 2015-10-22 DIAGNOSIS — Z79899 Other long term (current) drug therapy: Secondary | ICD-10-CM

## 2015-10-22 DIAGNOSIS — Z9221 Personal history of antineoplastic chemotherapy: Secondary | ICD-10-CM

## 2015-10-22 DIAGNOSIS — Z87442 Personal history of urinary calculi: Secondary | ICD-10-CM

## 2015-10-22 DIAGNOSIS — A419 Sepsis, unspecified organism: Secondary | ICD-10-CM | POA: Diagnosis not present

## 2015-10-22 DIAGNOSIS — Z923 Personal history of irradiation: Secondary | ICD-10-CM

## 2015-10-22 DIAGNOSIS — J9811 Atelectasis: Secondary | ICD-10-CM | POA: Diagnosis present

## 2015-10-22 DIAGNOSIS — Z888 Allergy status to other drugs, medicaments and biological substances status: Secondary | ICD-10-CM

## 2015-10-22 DIAGNOSIS — I2510 Atherosclerotic heart disease of native coronary artery without angina pectoris: Secondary | ICD-10-CM | POA: Diagnosis present

## 2015-10-22 DIAGNOSIS — D649 Anemia, unspecified: Secondary | ICD-10-CM | POA: Diagnosis present

## 2015-10-22 DIAGNOSIS — G35 Multiple sclerosis: Secondary | ICD-10-CM | POA: Diagnosis present

## 2015-10-22 DIAGNOSIS — I959 Hypotension, unspecified: Secondary | ICD-10-CM | POA: Diagnosis present

## 2015-10-22 DIAGNOSIS — C3492 Malignant neoplasm of unspecified part of left bronchus or lung: Secondary | ICD-10-CM | POA: Diagnosis present

## 2015-10-22 DIAGNOSIS — Z8249 Family history of ischemic heart disease and other diseases of the circulatory system: Secondary | ICD-10-CM

## 2015-10-22 DIAGNOSIS — R079 Chest pain, unspecified: Secondary | ICD-10-CM

## 2015-10-22 DIAGNOSIS — I4891 Unspecified atrial fibrillation: Secondary | ICD-10-CM | POA: Diagnosis present

## 2015-10-22 HISTORY — DX: Calculus of kidney: N20.0

## 2015-10-22 HISTORY — DX: Emphysema, unspecified: J43.9

## 2015-10-22 HISTORY — DX: Chronic obstructive pulmonary disease, unspecified: J44.9

## 2015-10-22 LAB — CBC
HCT: 43 % (ref 35.0–47.0)
HEMOGLOBIN: 13.8 g/dL (ref 12.0–16.0)
MCH: 30.2 pg (ref 26.0–34.0)
MCHC: 32.1 g/dL (ref 32.0–36.0)
MCV: 94.4 fL (ref 80.0–100.0)
Platelets: 472 10*3/uL — ABNORMAL HIGH (ref 150–440)
RBC: 4.55 MIL/uL (ref 3.80–5.20)
RDW: 15 % — AB (ref 11.5–14.5)
WBC: 26.9 10*3/uL — AB (ref 3.6–11.0)

## 2015-10-22 LAB — COMPREHENSIVE METABOLIC PANEL
ALBUMIN: 3.1 g/dL — AB (ref 3.5–5.0)
ALK PHOS: 100 U/L (ref 38–126)
ALT: 34 U/L (ref 14–54)
AST: 39 U/L (ref 15–41)
Anion gap: 10 (ref 5–15)
BILIRUBIN TOTAL: 0.5 mg/dL (ref 0.3–1.2)
BUN: 20 mg/dL (ref 6–20)
CALCIUM: 9.4 mg/dL (ref 8.9–10.3)
CO2: 30 mmol/L (ref 22–32)
CREATININE: 0.89 mg/dL (ref 0.44–1.00)
Chloride: 96 mmol/L — ABNORMAL LOW (ref 101–111)
GFR calc Af Amer: >60 mL/min (ref >60)
GLUCOSE: 126 mg/dL — AB (ref 65–99)
POTASSIUM: 3.7 mmol/L (ref 3.5–5.1)
Sodium: 136 mmol/L (ref 135–145)
TOTAL PROTEIN: 7.4 g/dL (ref 6.5–8.1)

## 2015-10-22 LAB — TROPONIN I

## 2015-10-22 LAB — FIBRIN DERIVATIVES D-DIMER (ARMC ONLY): Fibrin derivatives D-dimer (ARMC): 1122 — ABNORMAL HIGH (ref 0–499)

## 2015-10-22 LAB — LACTIC ACID, PLASMA: Lactic Acid, Venous: 0.7 mmol/L (ref 0.5–2.0)

## 2015-10-22 MED ORDER — VANCOMYCIN HCL IN DEXTROSE 1-5 GM/200ML-% IV SOLN
1000.0000 mg | INTRAVENOUS | Status: DC
  Administered 2015-10-23: 1000 mg via INTRAVENOUS
  Filled 2015-10-22: qty 200

## 2015-10-22 MED ORDER — DEXTROSE 5 % IV SOLN: 1.0000 g | Freq: Once | INTRAVENOUS | Status: DC

## 2015-10-22 MED ORDER — VANCOMYCIN HCL IN DEXTROSE 1-5 GM/200ML-% IV SOLN
1000.0000 mg | Freq: Once | INTRAVENOUS | Status: AC
  Administered 2015-10-22: 1000 mg via INTRAVENOUS
  Filled 2015-10-22: qty 200

## 2015-10-22 MED ORDER — DEXTROSE 5 % IV SOLN
2.0000 g | Freq: Once | INTRAVENOUS | Status: AC
  Administered 2015-10-22: 2 g via INTRAVENOUS
  Filled 2015-10-22: qty 2

## 2015-10-22 MED ORDER — MORPHINE SULFATE (PF) 4 MG/ML IV SOLN
4.0000 mg | Freq: Once | INTRAVENOUS | Status: AC
Start: 2015-10-22 — End: 2015-10-22
  Administered 2015-10-22: 4 mg via INTRAVENOUS

## 2015-10-22 MED ORDER — LEVOFLOXACIN IN D5W 750 MG/150ML IV SOLN: 750.0000 mg | Freq: Once | INTRAVENOUS | Status: DC

## 2015-10-22 MED ORDER — ONDANSETRON HCL 4 MG/2ML IJ SOLN
4.0000 mg | Freq: Once | INTRAMUSCULAR | Status: AC
  Administered 2015-10-22: 4 mg via INTRAVENOUS
  Filled 2015-10-22: qty 2

## 2015-10-22 MED ORDER — MORPHINE SULFATE (PF) 4 MG/ML IV SOLN
4.0000 mg | Freq: Once | INTRAVENOUS | Status: AC
  Administered 2015-10-22: 4 mg via INTRAVENOUS
  Filled 2015-10-22: qty 1

## 2015-10-22 MED ORDER — MORPHINE SULFATE (PF) 4 MG/ML IV SOLN
INTRAVENOUS | Status: AC
  Administered 2015-10-22: 4 mg via INTRAVENOUS
  Filled 2015-10-22: qty 1

## 2015-10-22 MED ORDER — DEXTROSE 5 % IV SOLN
500.0000 mg | Freq: Once | INTRAVENOUS | Status: AC
  Administered 2015-10-22: 500 mg via INTRAVENOUS
  Filled 2015-10-22: qty 500

## 2015-10-22 MED ORDER — IOPAMIDOL (ISOVUE-370) INJECTION 76%
75.0000 mL | Freq: Once | INTRAVENOUS | Status: AC | PRN
  Administered 2015-10-22: 75 mL via INTRAVENOUS

## 2015-10-22 MED ORDER — CEFEPIME HCL 2 G IJ SOLR
2.0000 g | Freq: Two times a day (BID) | INTRAMUSCULAR | Status: DC
  Filled 2015-10-22 (×2): qty 2

## 2015-10-22 NOTE — ED Notes (Signed)
Being treated with doxycycline and prednisone x 12 days - also started augmentin today after having an xr yesterday

## 2015-10-22 NOTE — ED Notes (Signed)
Acuity increased to 3 to reflect resources required to evaluate pt

## 2015-10-22 NOTE — ED Notes (Signed)
States took vicodin 4 hrs ago and states it doesn't work

## 2015-10-22 NOTE — ED Notes (Signed)
Pt on 1.5 L [place] O2

## 2015-10-22 NOTE — ED Provider Notes (Signed)
[facility] Emergency Department Provider Note  ____________________________________________    I have reviewed the triage vital signs and the nursing notes.   HISTORY  Chief Complaint Shortness of Breath    HPI Evelyn Willis is a 60 y.o. female who presents with complaints of pleuritic chest pain that is severe. Patient has a history of lung cancer, this was treated with chemotherapy and radiation in the past. Patient denies fevers or chills but does report significant pain when she takes deep breath. She feels this may be related to pneumonia, she says she's had many in the past. No recent travel. No calf pain or swelling. No nausea or vomiting.     Past Medical History  Diagnosis Date  . Depression   . Multiple sclerosis ([place])   . Coronary artery disease     said- stent was placed 10 yrs ago from now( 2017) in [place], [place]  . Hematuria   . Atrial fibrillation ([place])   . Cancer [facility]) 2016    Left lung cancer, radiation, chemo    Patient Active Problem List   Diagnosis Date Noted  . Gross hematuria 09/23/2015  . CAD (coronary artery disease) 08/15/2015  . Hematuria 08/15/2015  . Malignant neoplasm of lung ([place])   . Depression   . Multiple sclerosis ([place])   . Tachycardia   . Shortness of breath   . Encounter for anticoagulation discussion and counseling   . Smoker   . Centrilobular emphysema ([place])   . Atrial fibrillation with rapid ventricular response ([place]) 08/06/2015    Past Surgical History  Procedure Laterality Date  . Stomach ulcer removal  2011  . Total hip arthroplasty  2011    Current Outpatient Rx  Name  Route  Sig  Dispense  Refill  . albuterol (VENTOLIN HFA) 108 (90 Base) MCG/ACT inhaler   Inhalation   Inhale 2 puffs into the lungs every 4 (four) hours as needed.         . ALPRAZolam (XANAX) 1 MG tablet   Oral   Take 1 tablet by mouth daily. Reported on 10/17/2015         . amiodarone (PACERONE) 200 MG tablet    Oral   Take 1 tablet (200 mg total) by mouth 2 (two) times daily.   60 tablet   3   . amitriptyline (ELAVIL) 75 MG tablet   Oral   Take 75 mg by mouth at bedtime.         Marland Kitchen amoxicillin-clavulanate (AUGMENTIN) 875-125 MG tablet   Oral   Take 1 tablet by mouth 2 (two) times daily.   14 tablet   0   . amphetamine-dextroamphetamine (ADDERALL) 20 MG tablet   Oral   Take 20 mg by mouth daily. Reported on 09/23/2015         . buPROPion (WELLBUTRIN XL) 150 MG 24 hr tablet   Oral   Take 1 tablet by mouth daily.         Marland Kitchen HYDROcodone-acetaminophen (NORCO) 10-325 MG tablet   Oral   Take 1 tablet by mouth every 4 (four) hours as needed.         . metoprolol succinate (TOPROL XL) 25 MG 24 hr tablet   Oral   Take 1 tablet (25 mg total) by mouth daily. Patient not taking: Reported on 10/17/2015   30 tablet   11   . mirtazapine (REMERON) 15 MG tablet   Oral   Take 15 mg by mouth at bedtime.         Marland Kitchen  morphine (MS CONTIN) 15 MG 12 hr tablet   Oral   Take 1 tablet by mouth 2 (two) times daily.         . nicotine (RA NICOTINE) 21 mg/24hr patch   Transdermal   Place onto the skin.         Marland Kitchen SPIRIVA HANDIHALER 18 MCG inhalation capsule   Inhalation   Place 1 capsule into inhaler and inhale daily.           Dispense as written.     Allergies Aspirin and Nsaids  Family History  Problem Relation Age of Onset  . CAD Mother   . Atrial fibrillation Mother   . Heart attack Neg Hx   . Hypertension Neg Hx   . Stroke Neg Hx   . Kidney disease Neg Hx     Social History Social History  Substance Use Topics  . Smoking status: Former Research scientist (life sciences)  . Smokeless tobacco: Not on file     Comment: x 2 weeks  . Alcohol Use: 0.0 oz/week    0 Standard drinks or equivalent per week    Review of Systems  Constitutional: Negative for fever. Eyes: Negative for redness ENT: Negative for sore throat Cardiovascular: As above Respiratory: As above Gastrointestinal: Negative  for abdominal pain Genitourinary: Negative for dysuria. Musculoskeletal: Negative for back pain. Skin: Negative for rash. Neurological: Negative for focal weakness Psychiatric: Positive anxiety    ____________________________________________   PHYSICAL EXAM:  VITAL SIGNS: ED Triage Vitals  Enc Vitals Group     BP 10/22/15 1827 102/56 mmHg     Pulse Rate 10/22/15 1824 94     Resp 10/22/15 1824 22     Temp 10/22/15 1824 98.2 F (36.8 C)     Temp src --      SpO2 10/22/15 1827 96 %     Weight 10/22/15 1824 118 lb (53.524 kg)     Height 10/22/15 1824 '5\' 7"'$  (1.702 m)     Head Cir --      Peak Flow --      Pain Score --      Pain Loc --      Pain Edu? --      Excl. in [place]? --      Constitutional: Alert and oriented. No acute distress but anxious Eyes: Conjunctivae are normal. No erythema or injection ENT   Head: Normocephalic and atraumatic.   Mouth/Throat: Mucous membranes are moist. Cardiovascular: Normal rate, regular rhythm. Normal and symmetric distal pulses are present in the upper extremities. Respiratory: Normal respiratory effort without tachypnea nor retractions. Breath sounds are clear and equal bilaterally.  Gastrointestinal: Soft and non-tender in all quadrants. No distention. There is no CVA tenderness. Genitourinary: deferred Musculoskeletal: Nontender with normal range of motion in all extremities. No lower extremity tenderness nor edema. Neurologic:  Normal speech and language. No gross focal neurologic deficits are appreciated. Skin:  Skin is warm, dry and intact. No rash noted. Psychiatric: Patient is anxious secondary to pain  ____________________________________________    LABS (pertinent positives/negatives)  Labs Reviewed  CBC - Abnormal; Notable for the following:    WBC 26.9 (*)    RDW 15.0 (*)    Platelets 472 (*)    All other components within normal limits  COMPREHENSIVE METABOLIC PANEL - Abnormal; Notable for the following:     Chloride 96 (*)    Glucose, Bld 126 (*)    Albumin 3.1 (*)    All other components within normal limits  FIBRIN DERIVATIVES D-DIMER ([facility] ONLY) - Abnormal; Notable for the following:    Fibrin derivatives D-dimer ([facility]) 1122 (*)    All other components within normal limits  CULTURE, BLOOD (ROUTINE X 2)  CULTURE, BLOOD (ROUTINE X 2)  TROPONIN I  LACTIC ACID, PLASMA  LACTIC ACID, PLASMA    ____________________________________________   EKG  ED ECG REPORT I, Lavonia Drafts, the attending physician, personally viewed and interpreted this ECG.   Date: 10/22/2015  EKG Time: 8:10 PM  Rate: 95  Rhythm: normal sinus rhythm  Axis: Normal  Intervals:Prolonged QT interval  ST&T Change: Nonspecific changes   ____________________________________________    RADIOLOGY  Chronic changes in the left hemithorax   ____________________________________________   PROCEDURES  Procedure(s) performed: none  Critical Care performed: none  ____________________________________________   INITIAL IMPRESSION / ASSESSMENT AND PLAN / ED COURSE  Pertinent labs & imaging results that were available during my care of the patient were reviewed by me and considered in my medical decision making (see chart for details).  Patient presents with significant pleurisy. We will give IV morphine and IV Zofran for pain. Labs, chest x-ray pending.  Chest x-ray shows chronic changes. Lab work is significant for elevated d-dimer and white count of 27,000. Blood cultures ordered and an advice given for suspected pneumonia. We will send for CT angiography given elevated d-dimer.  I have asked my partner to follow up on CT results. Patient will require admission.  ____________________________________________   FINAL CLINICAL IMPRESSION(S) / ED DIAGNOSES  Final diagnoses:  Chest pain, unspecified chest pain type          Lavonia Drafts, MD 10/22/15 2135

## 2015-10-22 NOTE — ED Notes (Signed)
Pt states was dx with lung ca and had chemo and is finished - has check up scheduled for weds for 6 month check up

## 2015-10-22 NOTE — Progress Notes (Addendum)
Pharmacy Antibiotic Note  Evelyn Willis is a 60 y.o. female admitted on 10/22/2015 with pneumonia.  Pharmacy has been consulted for cefepime and vancomycin dosing.  Plan: Will give Vancomycin 1 g IV x 1 and will then start Vancomycin 1 g IV q18 hours.  Trough level prior to the 1600 dose on 5/30.  Cefepime: Will start Cefepime 2g IV q12 hours.  Height: '5\' 7"'$  (170.2 cm) Weight: 118 lb (53.524 kg) IBW/kg (Calculated) : 61.6  Temp (24hrs), Avg:98.2 F (36.8 C), Min:98.2 F (36.8 C), Max:98.2 F (36.8 C)   Recent Labs Lab 10/22/15 2025  WBC 26.9*  CREATININE 0.89    Estimated Creatinine Clearance: 56.1 mL/min (by C-G formula based on Cr of 0.89).    Allergies  Allergen Reactions  . Aspirin Other (See Comments)    Pt gets bleeding ulcers when she takes this medication Bleeding ulcers  . Nsaids Other (See Comments)    Other reaction(s): Other (See Comments) Cannot take due to significant peptic ulcer disease. Cannot take due to significant peptic ulcer disease.    Antimicrobials this admission:   >>    >>   Dose adjustments this admission:   Microbiology results:  BCx: pending  UCx:  Pending   Sputum:    MRSA PCR:   Thank you for allowing pharmacy to be a part of this patient's care.  James,Teldrin D 10/22/2015 9:48 PM

## 2015-10-22 NOTE — ED Notes (Signed)
Pt insisted on being placed on oxygen. States she has chest pain but refuses ekg stating "its my lungs hurting - i just need some pain medication"

## 2015-10-23 ENCOUNTER — Encounter: Payer: Self-pay | Admitting: Internal Medicine

## 2015-10-23 DIAGNOSIS — Z8249 Family history of ischemic heart disease and other diseases of the circulatory system: Secondary | ICD-10-CM | POA: Diagnosis not present

## 2015-10-23 DIAGNOSIS — I2510 Atherosclerotic heart disease of native coronary artery without angina pectoris: Secondary | ICD-10-CM | POA: Diagnosis present

## 2015-10-23 DIAGNOSIS — Z9221 Personal history of antineoplastic chemotherapy: Secondary | ICD-10-CM | POA: Diagnosis not present

## 2015-10-23 DIAGNOSIS — Z923 Personal history of irradiation: Secondary | ICD-10-CM | POA: Diagnosis not present

## 2015-10-23 DIAGNOSIS — I4891 Unspecified atrial fibrillation: Secondary | ICD-10-CM | POA: Diagnosis present

## 2015-10-23 DIAGNOSIS — I959 Hypotension, unspecified: Secondary | ICD-10-CM

## 2015-10-23 DIAGNOSIS — J189 Pneumonia, unspecified organism: Secondary | ICD-10-CM | POA: Diagnosis present

## 2015-10-23 DIAGNOSIS — Z87442 Personal history of urinary calculi: Secondary | ICD-10-CM | POA: Diagnosis not present

## 2015-10-23 DIAGNOSIS — J9811 Atelectasis: Secondary | ICD-10-CM | POA: Diagnosis present

## 2015-10-23 DIAGNOSIS — J440 Chronic obstructive pulmonary disease with acute lower respiratory infection: Secondary | ICD-10-CM | POA: Diagnosis present

## 2015-10-23 DIAGNOSIS — Z8711 Personal history of peptic ulcer disease: Secondary | ICD-10-CM | POA: Diagnosis not present

## 2015-10-23 DIAGNOSIS — R000 Tachycardia, unspecified: Secondary | ICD-10-CM | POA: Diagnosis present

## 2015-10-23 DIAGNOSIS — D649 Anemia, unspecified: Secondary | ICD-10-CM | POA: Diagnosis present

## 2015-10-23 DIAGNOSIS — R071 Chest pain on breathing: Secondary | ICD-10-CM | POA: Diagnosis not present

## 2015-10-23 DIAGNOSIS — Z96649 Presence of unspecified artificial hip joint: Secondary | ICD-10-CM | POA: Diagnosis present

## 2015-10-23 DIAGNOSIS — G35 Multiple sclerosis: Secondary | ICD-10-CM | POA: Diagnosis present

## 2015-10-23 DIAGNOSIS — F17210 Nicotine dependence, cigarettes, uncomplicated: Secondary | ICD-10-CM | POA: Diagnosis present

## 2015-10-23 DIAGNOSIS — J439 Emphysema, unspecified: Secondary | ICD-10-CM

## 2015-10-23 DIAGNOSIS — A419 Sepsis, unspecified organism: Secondary | ICD-10-CM | POA: Diagnosis present

## 2015-10-23 DIAGNOSIS — G8929 Other chronic pain: Secondary | ICD-10-CM | POA: Diagnosis present

## 2015-10-23 DIAGNOSIS — Z79899 Other long term (current) drug therapy: Secondary | ICD-10-CM | POA: Diagnosis not present

## 2015-10-23 DIAGNOSIS — C3492 Malignant neoplasm of unspecified part of left bronchus or lung: Secondary | ICD-10-CM | POA: Diagnosis present

## 2015-10-23 DIAGNOSIS — J90 Pleural effusion, not elsewhere classified: Secondary | ICD-10-CM | POA: Diagnosis not present

## 2015-10-23 DIAGNOSIS — Z888 Allergy status to other drugs, medicaments and biological substances status: Secondary | ICD-10-CM | POA: Diagnosis not present

## 2015-10-23 LAB — CBC
HCT: 35.1 % (ref 35.0–47.0)
Hemoglobin: 11.3 g/dL — ABNORMAL LOW (ref 12.0–16.0)
MCH: 30.4 pg (ref 26.0–34.0)
MCHC: 32.1 g/dL (ref 32.0–36.0)
MCV: 94.6 fL (ref 80.0–100.0)
PLATELETS: 331 10*3/uL (ref 150–440)
RBC: 3.71 MIL/uL — ABNORMAL LOW (ref 3.80–5.20)
RDW: 14.8 % — AB (ref 11.5–14.5)
WBC: 21.9 10*3/uL — AB (ref 3.6–11.0)

## 2015-10-23 LAB — BASIC METABOLIC PANEL
Anion gap: 5 (ref 5–15)
BUN: 13 mg/dL (ref 6–20)
CHLORIDE: 103 mmol/L (ref 101–111)
CO2: 28 mmol/L (ref 22–32)
CREATININE: 0.6 mg/dL (ref 0.44–1.00)
Calcium: 8.3 mg/dL — ABNORMAL LOW (ref 8.9–10.3)
GFR calc Af Amer: >60 mL/min (ref >60)
GFR calc non Af Amer: >60 mL/min (ref >60)
GLUCOSE: 120 mg/dL — AB (ref 65–99)
Potassium: 4 mmol/L (ref 3.5–5.1)
SODIUM: 136 mmol/L (ref 135–145)

## 2015-10-23 LAB — MRSA PCR SCREENING: MRSA by PCR: NEGATIVE

## 2015-10-23 LAB — GLUCOSE, CAPILLARY: Glucose-Capillary: 98 mg/dL (ref 65–99)

## 2015-10-23 MED ORDER — TIOTROPIUM BROMIDE MONOHYDRATE 18 MCG IN CAPS
1.0000 | ORAL_CAPSULE | Freq: Every day | RESPIRATORY_TRACT | Status: DC
  Filled 2015-10-23: qty 5

## 2015-10-23 MED ORDER — MORPHINE SULFATE ER 15 MG PO TBCR
15.0000 mg | EXTENDED_RELEASE_TABLET | Freq: Two times a day (BID) | ORAL | Status: DC
  Administered 2015-10-23 – 2015-10-25 (×6): 15 mg via ORAL
  Filled 2015-10-23 (×6): qty 1

## 2015-10-23 MED ORDER — HYDROCODONE-ACETAMINOPHEN 10-325 MG PO TABS
1.0000 | ORAL_TABLET | ORAL | Status: DC | PRN
  Administered 2015-10-23 – 2015-10-25 (×6): 1 via ORAL
  Filled 2015-10-23 (×6): qty 1

## 2015-10-23 MED ORDER — BUPROPION HCL ER (XL) 150 MG PO TB24
150.0000 mg | ORAL_TABLET | Freq: Every day | ORAL | Status: DC
  Administered 2015-10-23 – 2015-10-24 (×2): 150 mg via ORAL
  Filled 2015-10-23 (×4): qty 1

## 2015-10-23 MED ORDER — SODIUM CHLORIDE 0.9 % IV SOLN: 250.0000 mL | INTRAVENOUS | Status: DC | PRN

## 2015-10-23 MED ORDER — SODIUM CHLORIDE 0.9 % IV BOLUS (SEPSIS)
250.0000 mL | Freq: Once | INTRAVENOUS | Status: AC
  Administered 2015-10-23: 250 mL via INTRAVENOUS

## 2015-10-23 MED ORDER — HYDROCODONE-ACETAMINOPHEN 10-325 MG PO TABS
1.0000 | ORAL_TABLET | Freq: Four times a day (QID) | ORAL | Status: DC | PRN
  Administered 2015-10-23 (×2): 1 via ORAL
  Filled 2015-10-23 (×3): qty 1

## 2015-10-23 MED ORDER — ONDANSETRON HCL 4 MG PO TABS: 4.0000 mg | ORAL_TABLET | Freq: Four times a day (QID) | ORAL | Status: DC | PRN

## 2015-10-23 MED ORDER — SODIUM CHLORIDE 0.9 % IV BOLUS (SEPSIS)
500.0000 mL | Freq: Once | INTRAVENOUS | Status: AC
  Administered 2015-10-23: 500 mL via INTRAVENOUS

## 2015-10-23 MED ORDER — ACETAMINOPHEN 325 MG PO TABS
650.0000 mg | ORAL_TABLET | Freq: Four times a day (QID) | ORAL | Status: DC | PRN
  Administered 2015-10-23: 650 mg via ORAL
  Filled 2015-10-23: qty 2

## 2015-10-23 MED ORDER — SODIUM CHLORIDE 0.9% FLUSH: 3.0000 mL | INTRAVENOUS | Status: DC | PRN

## 2015-10-23 MED ORDER — SODIUM CHLORIDE 0.9% FLUSH
3.0000 mL | Freq: Two times a day (BID) | INTRAVENOUS | Status: DC
  Administered 2015-10-23 – 2015-10-24 (×5): 3 mL via INTRAVENOUS

## 2015-10-23 MED ORDER — ALBUTEROL SULFATE (2.5 MG/3ML) 0.083% IN NEBU: 2.5000 mg | INHALATION_SOLUTION | RESPIRATORY_TRACT | Status: DC | PRN

## 2015-10-23 MED ORDER — MIRTAZAPINE 15 MG PO TABS
15.0000 mg | ORAL_TABLET | Freq: Every day | ORAL | Status: DC
  Administered 2015-10-23 – 2015-10-24 (×2): 15 mg via ORAL
  Filled 2015-10-23 (×2): qty 1

## 2015-10-23 MED ORDER — IPRATROPIUM-ALBUTEROL 0.5-2.5 (3) MG/3ML IN SOLN: 3.0000 mL | Freq: Four times a day (QID) | RESPIRATORY_TRACT | Status: DC | PRN

## 2015-10-23 MED ORDER — DEXTROSE 5 % IV SOLN
1.0000 g | Freq: Three times a day (TID) | INTRAVENOUS | Status: DC
  Administered 2015-10-23 – 2015-10-25 (×6): 1 g via INTRAVENOUS
  Filled 2015-10-23 (×8): qty 1

## 2015-10-23 MED ORDER — ONDANSETRON HCL 4 MG/2ML IJ SOLN: 4.0000 mg | Freq: Four times a day (QID) | INTRAMUSCULAR | Status: DC | PRN

## 2015-10-23 MED ORDER — AMIODARONE HCL 200 MG PO TABS
200.0000 mg | ORAL_TABLET | Freq: Two times a day (BID) | ORAL | Status: DC
  Administered 2015-10-23 – 2015-10-25 (×6): 200 mg via ORAL
  Filled 2015-10-23 (×6): qty 1

## 2015-10-23 MED ORDER — AMPHETAMINE-DEXTROAMPHETAMINE 5 MG PO TABS
20.0000 mg | ORAL_TABLET | Freq: Every day | ORAL | Status: DC
Start: 2015-10-23 — End: 2015-10-25
  Administered 2015-10-25: 20 mg via ORAL
  Filled 2015-10-23 (×3): qty 4

## 2015-10-23 MED ORDER — ENOXAPARIN SODIUM 40 MG/0.4ML ~~LOC~~ SOLN
40.0000 mg | Freq: Every day | SUBCUTANEOUS | Status: DC
  Administered 2015-10-23 – 2015-10-25 (×3): 40 mg via SUBCUTANEOUS
  Filled 2015-10-23 (×3): qty 0.4

## 2015-10-23 MED ORDER — VANCOMYCIN HCL IN DEXTROSE 750-5 MG/150ML-% IV SOLN
750.0000 mg | Freq: Two times a day (BID) | INTRAVENOUS | Status: DC
  Administered 2015-10-23 – 2015-10-24 (×3): 750 mg via INTRAVENOUS
  Filled 2015-10-23 (×4): qty 150

## 2015-10-23 MED ORDER — ACETAMINOPHEN 650 MG RE SUPP: 650.0000 mg | Freq: Four times a day (QID) | RECTAL | Status: DC | PRN

## 2015-10-23 MED ORDER — AMITRIPTYLINE HCL 50 MG PO TABS
75.0000 mg | ORAL_TABLET | Freq: Every day | ORAL | Status: DC
  Administered 2015-10-23 – 2015-10-24 (×2): 75 mg via ORAL
  Filled 2015-10-23: qty 1
  Filled 2015-10-23: qty 3

## 2015-10-23 NOTE — Progress Notes (Signed)
Pharmacy Antibiotic Note  Evelyn Willis is a 60 y.o. female admitted on 10/22/2015 with pneumonia.  Pharmacy has been consulted for cefepime and vancomycin dosing.  Plan: Renal function improved. Patient qualifies for Vancomycin 750 mg IV q12 hour dosing. Trough level prior to the 06:00 dose on 5/30.   Cefepime: Patient qualifies for Cefepime 1 g IV q8 hours.   Height: '5\' 7"'$  (170.2 cm) Weight: 118 lb 12.8 oz (53.887 kg) IBW/kg (Calculated) : 61.6  Temp (24hrs), Avg:98.3 F (36.8 C), Min:97.9 F (36.6 C), Max:98.6 F (37 C)   Recent Labs Lab 10/22/15 2025 10/22/15 2200 10/23/15 0517  WBC 26.9*  --  21.9*  CREATININE 0.89  --  0.60  LATICACIDVEN  --  0.7  --     Estimated Creatinine Clearance: 62.8 mL/min (by C-G formula based on Cr of 0.6).    Allergies  Allergen Reactions  . Aspirin Other (See Comments)    Pt gets bleeding ulcers when she takes this medication Bleeding ulcers  . Nsaids Other (See Comments)    Other reaction(s): Other (See Comments) Cannot take due to significant peptic ulcer disease. Cannot take due to significant peptic ulcer disease.    Antimicrobials this admission:   >>    >>   Dose adjustments this admission: Cefepime 1 g IV q8 hours.   Microbiology results:  BCx: pending  UCx:  Pending   Sputum:    MRSA PCR:   Thank you for allowing pharmacy to be a part of this patient's care.  Remer Couse D 10/23/2015 11:25 AM

## 2015-10-23 NOTE — H&P (Signed)
[name]    MR#:  [id number]  DATE OF BIRTH:  [birth year]  DATE OF ADMISSION:  10/22/2015  PRIMARY CARE PHYSICIAN: BABAOFF, Caryl Bis, MD   REQUESTING/REFERRING PHYSICIAN:   CHIEF COMPLAINT:   Chief Complaint  Patient presents with  . Shortness of Breath    HISTORY OF PRESENT ILLNESS: Evelyn Willis  is a 60 y.o. female with a known history of Multiple sclerosis, coronary artery disease, atrial fibrillation, left lung cancer presented to the emergency room with difficulty breathing. Patient received chemotherapy and radiation for lung cancer. Patient had pneumonia of the left lung in the past and has been diagnosed with left lung cancer 6 months ago. Patient was worked up in the emergency room and was found to have collapse of the left lung due to obstruction. No history of any fever or chills. Patient had a low blood pressure in the emergency room was bolused with IV fluid. Patient also has some pain in the left side of the chest when she takes a deep breath. Her d-dimer levels were high and she was worked up with CT angiogram of the chest showed no pulmonary embolism. Hospitalist service was consulted for further care of the patient. No history of any hematemesis or hemoptysis.  PAST MEDICAL HISTORY:   Past Medical History  Diagnosis Date  . Depression   . Multiple sclerosis ([place])   . Coronary artery disease     said- stent was placed 10 yrs ago from now( 2017) in [place], [place]  . Hematuria   . Atrial fibrillation ([place])   . Cancer [facility]) 2016    Left lung cancer, radiation, chemo    PAST SURGICAL HISTORY: Past Surgical History  Procedure Laterality Date  . Stomach ulcer removal  2011  . Total hip arthroplasty  2011    SOCIAL HISTORY:  Social History  Substance Use Topics  . Smoking status: Former Research scientist (life sciences)  . Smokeless tobacco: Not on file     Comment: x 2 weeks  . Alcohol Use: 0.0 oz/week    0  Standard drinks or equivalent per week    FAMILY HISTORY:  Family History  Problem Relation Age of Onset  . CAD Mother   . Atrial fibrillation Mother   . Heart attack Neg Hx   . Hypertension Neg Hx   . Stroke Neg Hx   . Kidney disease Neg Hx     DRUG ALLERGIES:  Allergies  Allergen Reactions  . Aspirin Other (See Comments)    Pt gets bleeding ulcers when she takes this medication Bleeding ulcers  . Nsaids Other (See Comments)    Other reaction(s): Other (See Comments) Cannot take due to significant peptic ulcer disease. Cannot take due to significant peptic ulcer disease.    REVIEW OF SYSTEMS:   CONSTITUTIONAL: No fever, fatigue or weakness.  EYES: No blurred or double vision.  EARS, NOSE, AND THROAT: No tinnitus or ear pain.  RESPIRATORY: Has cough, has shortness of breath, no wheezing or hemoptysis.  CARDIOVASCULAR:  chest pain on left side on deep breathing, no orthopnea, edema.  GASTROINTESTINAL: No nausea, vomiting, diarrhea or abdominal pain.  GENITOURINARY: No dysuria, hematuria.  ENDOCRINE: No polyuria, nocturia,  HEMATOLOGY: No anemia, easy bruising or bleeding SKIN: No rash or lesion. MUSCULOSKELETAL: No joint pain or arthritis.   NEUROLOGIC: No tingling, numbness, weakness.  PSYCHIATRY: No anxiety or depression.   MEDICATIONS AT HOME:  Prior to Admission medications  Medication Sig Start Date End Date Taking? Authorizing Provider  amiodarone (PACERONE) 200 MG tablet Take 1 tablet (200 mg total) by mouth 2 (two) times daily. 09/16/15  Yes Minna Merritts, MD  amitriptyline (ELAVIL) 75 MG tablet Take 75 mg by mouth at bedtime. 02/10/14  Yes Historical Provider, MD  amoxicillin-clavulanate (AUGMENTIN) 875-125 MG tablet Take 1 tablet by mouth 2 (two) times daily. 10/20/15 10/27/15 Yes Shannon A McGowan, PA-C  buPROPion (WELLBUTRIN XL) 150 MG 24 hr tablet Take 1 tablet by mouth daily. 07/19/15  Yes Historical Provider, MD  HYDROcodone-acetaminophen (NORCO) 10-325 MG  tablet Take 1 tablet by mouth every 4 (four) hours as needed.   Yes Historical Provider, MD  metoprolol succinate (TOPROL XL) 25 MG 24 hr tablet Take 1 tablet (25 mg total) by mouth daily. 08/15/15  Yes Minna Merritts, MD  mirtazapine (REMERON) 15 MG tablet Take 15 mg by mouth at bedtime. 08/18/12  Yes Historical Provider, MD  morphine (MS CONTIN) 15 MG 12 hr tablet Take 1 tablet by mouth 2 (two) times daily. 08/04/15  Yes Historical Provider, MD  SPIRIVA HANDIHALER 18 MCG inhalation capsule Place 1 capsule into inhaler and inhale daily. 07/05/15  Yes Historical Provider, MD  albuterol (VENTOLIN HFA) 108 (90 Base) MCG/ACT inhaler Inhale 2 puffs into the lungs every 4 (four) hours as needed. 07/06/15   Historical Provider, MD  amphetamine-dextroamphetamine (ADDERALL) 20 MG tablet Take 20 mg by mouth daily. Reported on 09/23/2015 08/06/15   Historical Provider, MD      PHYSICAL EXAMINATION:   VITAL SIGNS: Blood pressure 110/66, pulse 87, temperature 98.2 F (36.8 C), resp. rate 22, height '5\' 7"'$  (1.702 m), weight 53.524 kg (118 lb), SpO2 93 %.  GENERAL:  60 y.o.-year-old patient lying in the bed with no acute distress.  EYES: Pupils equal, round, reactive to light and accommodation. No scleral icterus. Extraocular muscles intact.  HEENT: Head atraumatic, normocephalic. Oropharynx and nasopharynx clear.  NECK:  Supple, no jugular venous distention. No thyroid enlargement, no tenderness.  LUNGS: Normal breath sounds right lung,absent breath sounds left lung, no wheezing, rales,rhonchi or crepitation. No use of accessory muscles of respiration.  CARDIOVASCULAR: S1, S2 normal. No murmurs, rubs, or gallops.  ABDOMEN: Soft, nontender, nondistended. Bowel sounds present. No organomegaly or mass.  EXTREMITIES: No pedal edema, cyanosis, or clubbing.  NEUROLOGIC: Cranial nerves II through XII are intact. Muscle strength 5/5 in all extremities. Sensation intact. Gait not checked.  PSYCHIATRIC: The patient is alert  and oriented x 3.  SKIN: No obvious rash, lesion, or ulcer.   LABORATORY PANEL:   CBC  Recent Labs Lab 10/22/15 2025  WBC 26.9*  HGB 13.8  HCT 43.0  PLT 472*  MCV 94.4  MCH 30.2  MCHC 32.1  RDW 15.0*   ------------------------------------------------------------------------------------------------------------------  Chemistries   Recent Labs Lab 10/22/15 2025  NA 136  K 3.7  CL 96*  CO2 30  GLUCOSE 126*  BUN 20  CREATININE 0.89  CALCIUM 9.4  AST 39  ALT 34  ALKPHOS 100  BILITOT 0.5   ------------------------------------------------------------------------------------------------------------------ estimated creatinine clearance is 56.1 mL/min (by C-G formula based on Cr of 0.89). ------------------------------------------------------------------------------------------------------------------ No results for input(s): TSH, T4TOTAL, T3FREE, THYROIDAB in the last 72 hours.  Invalid input(s): FREET3   Coagulation profile No results for input(s): INR, PROTIME in the last 168 hours. ------------------------------------------------------------------------------------------------------------------- No results for input(s): DDIMER in the last 72 hours. -------------------------------------------------------------------------------------------------------------------  Cardiac Enzymes  Recent Labs Lab 10/22/15 2025  TROPONINI <0.03   ------------------------------------------------------------------------------------------------------------------  Invalid input(s): POCBNP  ---------------------------------------------------------------------------------------------------------------  Urinalysis    Component Value Date/Time   COLORURINE YELLOW* 08/06/2015 1151   APPEARANCEUR Cloudy* 10/17/2015 1443   APPEARANCEUR CLEAR* 08/06/2015 1151   LABSPEC 1.014 08/06/2015 1151   PHURINE 5.0 08/06/2015 1151   GLUCOSEU Negative 10/17/2015 1443   HGBUR 2+* 08/06/2015  1151   BILIRUBINUR Negative 10/17/2015 1443   BILIRUBINUR NEGATIVE 08/06/2015 1151   KETONESUR NEGATIVE 08/06/2015 1151   PROTEINUR Negative 10/17/2015 1443   PROTEINUR NEGATIVE 08/06/2015 1151   UROBILINOGEN 1.0 09/09/2009 1950   NITRITE Negative 10/17/2015 1443   NITRITE NEGATIVE 08/06/2015 1151   LEUKOCYTESUR Trace* 10/17/2015 1443   LEUKOCYTESUR TRACE* 08/06/2015 1151     RADIOLOGY: Dg Chest 2 View  10/22/2015  CLINICAL DATA:  Chest pain EXAM: CHEST  2 VIEW COMPARISON:  10/12/2015 FINDINGS: Cardiac shadow is within normal limits. The right lung is hyperinflated and clear. Chronic changes are noted in the left hemi thorax similar to that noted on prior CT examination. This is consistent with the patient's given clinical history of lung carcinoma and treatment. Visualized upper abdomen is within normal limits. IMPRESSION: Chronic changes in the left hemi thorax consistent with the given clinical history of carcinoma of the lung. Hyperinflation in the right lung without focal abnormality. Electronically Signed   By: Inez Catalina M.D.   On: 10/22/2015 21:00   Ct Angio Chest Pe W/cm &/or Wo Cm  10/22/2015  CLINICAL DATA:  Pleuritic chest pain.  Elevated D-dimer. EXAM: CT ANGIOGRAPHY CHEST WITH CONTRAST TECHNIQUE: Multidetector CT imaging of the chest was performed using the standard protocol during bolus administration of intravenous contrast. Multiplanar CT image reconstructions and MIPs were obtained to evaluate the vascular anatomy. CONTRAST:  75 cc Isovue 370 IV COMPARISON:  Chest radiograph earlier this date, as well as 08/06/2015 FINDINGS: There are no filling defects within the pulmonary arteries to suggest pulmonary embolus. Left lung branches limited secondary to atelectatic changes, no clinically significant filling defect. Near complete collapse of the left lung with only minimal aerated upper lobe portion. There is complete occlusion of the left mainstem bronchus image 67 series 7. Left  lung collapse limits detailed assessment, there is a probable hypodense lesion in the left hilum extending into the bronchus as cause of bronchial obstruction. Borders are difficult to define, however measures approximately 2.7 x 1.8 x 4.0 cm. Circumferential small left pleural effusion. Right lung hyperinflation, likely compensatory. Mild emphysema in the right lung. Minimal faint tree in bud opacities at the right lung base. Right bronchi are patent. There is no right pleural effusion. Moderate atherosclerosis of thoracic aorta. There is leftward mediastinal shift secondary to left lung volume loss. Coronary artery calcifications. Small volume pericardial fluid, difficult to differentiate from pleural fluid. Small prevascular lymph nodes for example 7 mm short axis image 47 series 5. Enlarged left supraclavicular node measures 1.5 by 1.6 cm image 27 series 5. Additional smaller supraclavicular nodes on the left. No right hilar adenopathy. Patulous esophagus with fluid level. No definite acute abnormality in the included upper abdomen. Adrenal glands partially included without nodule. Mild superior endplate compression deformity tentatively identified T11. No blastic or destructive lytic lesions. Review of the MIP images confirms the above findings. IMPRESSION: 1. No pulmonary embolus. 2. Near complete collapse of the left lung, with probable obstructing mass at the left hilum. There is complete left mainstem bronchial obstruction. Recommend bronchoscopy. Small left pleural effusion. 3. Left supraclavicular adenopathy, likely metastatic. 4. Hyperinflated right lung with emphysema. Tree  in bud opacities in the right lower lobe may be infectious, inflammatory, or secondary to aspiration. Electronically Signed   By: Jeb Levering M.D.   On: 10/22/2015 22:39    EKG: Orders placed or performed in visit on 08/15/15  . EKG 12-Lead    IMPRESSION AND PLAN: 60 year old female patient with history of lung cancer,  coronary artery disease, multiple sclerosis and her to the emergency room with the shortness of breath. Admitting diagnosis 1. Left lung collapse 2. Obstructive pneumonia left lung 3. Left lung cancer 4. Dyspnea 5. Leukocytosis  Treatment plan : Admit patient to medical floor Oxygen via nasal cannula Pulmonary consult for bronchoscopy for left lung collapse Start patient on IV vancomycin and IV cefepime antibiotics DVT prophylaxis with subcutaneous Lovenox Supportive care.  All the records are reviewed and case discussed with ED provider. Management plans discussed with the patient, family and they are in agreement.  CODE STATUS:FULL Code Status History    Date Active Date Inactive Code Status Order ID Comments User Context   08/06/2015 11:33 AM 08/07/2015  8:44 PM Full Code [id number]  Vaughan Basta, MD Inpatient       TOTAL TIME TAKING CARE OF THIS PATIENT: 58 minutes.    Saundra Shelling M.D on 10/23/2015 at 1:13 AM  Between 7am to 6pm - Pager - [phone]  After 6pm go to [email or web address] - password [email or web address] [facility]  Office  [phone]  CC: Primary care physician; BABAOFF, Caryl Bis, MD

## 2015-10-23 NOTE — ED Notes (Signed)
Transporting patient to room 157 -1A

## 2015-10-23 NOTE — ED Provider Notes (Signed)
-----------------------------------------   12:25 AM on 10/23/2015 -----------------------------------------  Patient was requesting pain medication which we did give her, the results and it temporarily drop in her blood pressure. She has otherwise been with reassuring blood pressures during her stay over the last 6 hours. We did discuss with the hospitalist and they feel that the patient may require an ICU admission. We discussed with the ICU attending, and they would like to give the patient an IV fluid bolus and determined after that whether the patient requires the ICU. Patient is in no acute distress. Oxygen saturation 7 good on her home oxygen. CT scan is noted, white count is noted aggressive antibiotics have been commenced cultures have been obtained. Signed out to Dr. Beather Arbour at the end of my shift.  Schuyler Amor, MD 10/23/15 [phone]

## 2015-10-23 NOTE — Progress Notes (Addendum)
Patient ID: Evelyn Willis, female   DOB: April 17, 1956, 60 y.o.   MRN: [id number] [facility] PROGRESS NOTE  LAURENASHLEY VIAR GHW:[id number] DOB: Mar 31, 1956 DOA: 10/22/2015 PCP: Marcello Fennel, MD  HPI/Subjective: Patient with left-sided chest pain going on for weeks. Some shortness of breath. Some cough.  Objective: Filed Vitals:   10/23/15 1100 10/23/15 1200  BP: 96/57 100/61  Pulse: 75 74  Temp:    Resp: 20 26    Filed Weights   10/22/15 1824 10/23/15 0319  Weight: 53.524 kg (118 lb) 53.887 kg (118 lb 12.8 oz)    ROS: Review of Systems  Constitutional: Negative for fever and chills.  Eyes: Negative for blurred vision.  Respiratory: Positive for cough and shortness of breath.   Cardiovascular: Positive for chest pain.  Gastrointestinal: Negative for nausea, vomiting, abdominal pain, diarrhea and constipation.  Genitourinary: Negative for dysuria.  Musculoskeletal: Negative for joint pain.  Neurological: Negative for dizziness and headaches.   Exam: Physical Exam  Constitutional: She is oriented to person, place, and time.  HENT:  Nose: No mucosal edema.  Mouth/Throat: No oropharyngeal exudate or posterior oropharyngeal edema.  Eyes: Conjunctivae, EOM and lids are normal. Pupils are equal, round, and reactive to light.  Neck: No JVD present. Carotid bruit is not present. No edema present. No thyroid mass and no thyromegaly present.  Cardiovascular: S1 normal and S2 normal.  Exam reveals no gallop.   No murmur heard. Pulses:      Dorsalis pedis pulses are 2+ on the right side, and 2+ on the left side.  Respiratory: No respiratory distress. She has decreased breath sounds in the left upper field, the left middle field and the left lower field. She has no wheezes. She has no rhonchi. She has no rales.  GI: Soft. Bowel sounds are normal. There is no tenderness.  Musculoskeletal:       Right ankle: She exhibits no swelling.       Left ankle: She exhibits no swelling.   Lymphadenopathy:    She has no cervical adenopathy.  Neurological: She is alert and oriented to person, place, and time. No cranial nerve deficit.  Skin: Skin is warm. No rash noted. Nails show no clubbing.  Psychiatric: She has a normal mood and affect.      Data Reviewed: Basic Metabolic Panel:  Recent Labs Lab 10/22/15 2025 10/23/15 0517  NA 136 136  K 3.7 4.0  CL 96* 103  CO2 30 28  GLUCOSE 126* 120*  BUN 20 13  CREATININE 0.89 0.60  CALCIUM 9.4 8.3*   Liver Function Tests:  Recent Labs Lab 10/22/15 2025  AST 39  ALT 34  ALKPHOS 100  BILITOT 0.5  PROT 7.4  ALBUMIN 3.1*   CBC:  Recent Labs Lab 10/22/15 2025 10/23/15 0517  WBC 26.9* 21.9*  HGB 13.8 11.3*  HCT 43.0 35.1  MCV 94.4 94.6  PLT 472* 331   Cardiac Enzymes:  Recent Labs Lab 10/22/15 2025  TROPONINI <0.03   CBG:  Recent Labs Lab 10/23/15 0905  GLUCAP 98    Recent Results (from the past 240 hour(s))  CULTURE, URINE COMPREHENSIVE     Status: Abnormal   Collection Time: 10/17/15  2:43 PM  Result Value Ref Range Status   Urine Culture, Comprehensive Final report (A)  Final   Result 1 Proteus mirabilis (A)  Final    Comment: 25,000-50,000 colony forming units per mL   ANTIMICROBIAL SUSCEPTIBILITY Comment  Final    Comment:       **  S = Susceptible; I = Intermediate; R = Resistant **                    P = Positive; N = Negative             MICS are expressed in micrograms per mL    Antibiotic                 RSLT#1    RSLT#2    RSLT#3    RSLT#4 Amoxicillin/Clavulanic Acid    S Ampicillin                     S Cefepime                       S Ceftriaxone                    S Cefuroxime                     S Cephalothin                    S Ciprofloxacin                  S Ertapenem                      S Gentamicin                     S Levofloxacin                   S Nitrofurantoin                 R Piperacillin                   S Tetracycline                    R Tobramycin                     S Trimethoprim/Sulfa             R   Microscopic Examination     Status: Abnormal   Collection Time: 10/17/15  2:43 PM  Result Value Ref Range Status   WBC, UA 0-5 0 -  5 /hpf Final   RBC, UA >30 (A) 0 -  2 /hpf Final   Epithelial Cells (non renal) >10 (A) 0 - 10 /hpf Final   Renal Epithel, UA 0-10 (A) None seen /hpf Final   Bacteria, UA None seen None seen/Few Final  Blood culture (routine x 2)     Status: None (Preliminary result)   Collection Time: 10/22/15  9:24 PM  Result Value Ref Range Status   Specimen Description BLOOD RIGHT ASSIST CONTROL  Final   Special Requests BOTTLES DRAWN AEROBIC AND ANAEROBIC 5CCAERO,5CCANA  Final   Culture NO GROWTH < 12 HOURS  Final   Report Status PENDING  Incomplete  Blood culture (routine x 2)     Status: None (Preliminary result)   Collection Time: 10/22/15  9:24 PM  Result Value Ref Range Status   Specimen Description BLOOD LEFT HAND  Final   Special Requests   Final    BOTTLES DRAWN AEROBIC AND ANAEROBIC [place]   Culture NO GROWTH < 12 HOURS  Final   Report Status PENDING  Incomplete  MRSA PCR Screening     Status: None   Collection Time: 10/23/15  9:10 AM  Result Value Ref Range Status   MRSA by PCR NEGATIVE NEGATIVE Final    Comment:        The GeneXpert MRSA Assay (FDA approved for NASAL specimens only), is one component of a comprehensive MRSA colonization surveillance program. It is not intended to diagnose MRSA infection nor to guide or monitor treatment for MRSA infections.      Studies: Dg Chest 2 View  10/22/2015  CLINICAL DATA:  Chest pain EXAM: CHEST  2 VIEW COMPARISON:  10/12/2015 FINDINGS: Cardiac shadow is within normal limits. The right lung is hyperinflated and clear. Chronic changes are noted in the left hemi thorax similar to that noted on prior CT examination. This is consistent with the patient's given clinical history of lung carcinoma and treatment. Visualized upper  abdomen is within normal limits. IMPRESSION: Chronic changes in the left hemi thorax consistent with the given clinical history of carcinoma of the lung. Hyperinflation in the right lung without focal abnormality. Electronically Signed   By: Inez Catalina M.D.   On: 10/22/2015 21:00   Ct Angio Chest Pe W/cm &/or Wo Cm  10/22/2015  CLINICAL DATA:  Pleuritic chest pain.  Elevated D-dimer. EXAM: CT ANGIOGRAPHY CHEST WITH CONTRAST TECHNIQUE: Multidetector CT imaging of the chest was performed using the standard protocol during bolus administration of intravenous contrast. Multiplanar CT image reconstructions and MIPs were obtained to evaluate the vascular anatomy. CONTRAST:  75 cc Isovue 370 IV COMPARISON:  Chest radiograph earlier this date, as well as 08/06/2015 FINDINGS: There are no filling defects within the pulmonary arteries to suggest pulmonary embolus. Left lung branches limited secondary to atelectatic changes, no clinically significant filling defect. Near complete collapse of the left lung with only minimal aerated upper lobe portion. There is complete occlusion of the left mainstem bronchus image 67 series 7. Left lung collapse limits detailed assessment, there is a probable hypodense lesion in the left hilum extending into the bronchus as cause of bronchial obstruction. Borders are difficult to define, however measures approximately 2.7 x 1.8 x 4.0 cm. Circumferential small left pleural effusion. Right lung hyperinflation, likely compensatory. Mild emphysema in the right lung. Minimal faint tree in bud opacities at the right lung base. Right bronchi are patent. There is no right pleural effusion. Moderate atherosclerosis of thoracic aorta. There is leftward mediastinal shift secondary to left lung volume loss. Coronary artery calcifications. Small volume pericardial fluid, difficult to differentiate from pleural fluid. Small prevascular lymph nodes for example 7 mm short axis image 47 series 5. Enlarged  left supraclavicular node measures 1.5 by 1.6 cm image 27 series 5. Additional smaller supraclavicular nodes on the left. No right hilar adenopathy. Patulous esophagus with fluid level. No definite acute abnormality in the included upper abdomen. Adrenal glands partially included without nodule. Mild superior endplate compression deformity tentatively identified T11. No blastic or destructive lytic lesions. Review of the MIP images confirms the above findings. IMPRESSION: 1. No pulmonary embolus. 2. Near complete collapse of the left lung, with probable obstructing mass at the left hilum. There is complete left mainstem bronchial obstruction. Recommend bronchoscopy. Small left pleural effusion. 3. Left supraclavicular adenopathy, likely metastatic. 4. Hyperinflated right lung with emphysema. Tree in bud opacities in the right lower lobe may be infectious, inflammatory, or secondary to aspiration. Electronically Signed   By: Jeb Levering M.D.   On: 10/22/2015 22:39    Scheduled Meds: .  amiodarone  200 mg Oral BID  . amitriptyline  75 mg Oral QHS  . amphetamine-dextroamphetamine  20 mg Oral Daily  . buPROPion  150 mg Oral Daily  . ceFEPime (MAXIPIME) IV  1 g Intravenous Q8H  . enoxaparin (LOVENOX) injection  40 mg Subcutaneous Q0600  . mirtazapine  15 mg Oral QHS  . morphine  15 mg Oral BID  . sodium chloride flush  3 mL Intravenous Q12H  . vancomycin  750 mg Intravenous Q12H    Assessment/Plan:  1. Clinical sepsis present on admission with leukocytosis, tachycardia and postobstructive pneumonia. Empiric antibiotics with vancomycin and Maxipime. 2. 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 Likely will need a bronchoscopy. 3. Atrial fibrillation. Rate controlled with amiodarone 4. Hypotension hold antihypertensive medications 5. Chronic pain on morphine 6. Depression on Wellbutrin 7. Patient initially placed on BiPAP and transferred to the ICU to try to get better air entry.  Code  Status:     Code Status Orders        Start     Ordered   10/23/15 0320  Full code   Continuous     10/23/15 0319    Code Status History    Date Active Date Inactive Code Status Order ID Comments User Context   08/06/2015 11:33 AM 08/07/2015  8:44 PM Full Code [id number]  Vaughan Basta, MD Inpatient     Family Communication: Family at bedside Disposition Plan: To be determined  Consultants:  Critical care specialist  Antibiotics:  Vancomycin  Zosyn  Time spent: 35 minutes  [place], [place]

## 2015-10-23 NOTE — Progress Notes (Addendum)
Pharmacy Antibiotic Note  Evelyn Willis is a 60 y.o. female admitted on 10/22/2015 with pneumonia.  Pharmacy has been consulted for cefepime and vancomycin dosing.  Plan: Continue Vancomycin 1 g IV q18 hours.   Trough level prior to the 1600 dose on 5/30.  Cefepime: Patient qualifies for Cefepime 1 g IV q8 hours.   Height: '5\' 7"'$  (170.2 cm) Weight: 118 lb 12.8 oz (53.887 kg) IBW/kg (Calculated) : 61.6  Temp (24hrs), Avg:98.3 F (36.8 C), Min:97.9 F (36.6 C), Max:98.6 F (37 C)   Recent Labs Lab 10/22/15 2025 10/22/15 2200 10/23/15 0517  WBC 26.9*  --  21.9*  CREATININE 0.89  --  0.60  LATICACIDVEN  --  0.7  --     Estimated Creatinine Clearance: 62.8 mL/min (by C-G formula based on Cr of 0.6).    Allergies  Allergen Reactions  . Aspirin Other (See Comments)    Pt gets bleeding ulcers when she takes this medication Bleeding ulcers  . Nsaids Other (See Comments)    Other reaction(s): Other (See Comments) Cannot take due to significant peptic ulcer disease. Cannot take due to significant peptic ulcer disease.    Antimicrobials this admission:   >>    >>   Dose adjustments this admission: Cefepime 1 g IV q8 hours.   Microbiology results:  BCx: pending  UCx:  Pending   Sputum:    MRSA PCR:   Thank you for allowing pharmacy to be a part of this patient's care.  Wylie Russon D 10/23/2015 11:17 AM

## 2015-10-23 NOTE — Progress Notes (Signed)
Paged Dr. Leslye Peer regarding patients blood pressure 70/38 and severe levels of pain 8/10  Patient is requesting pain medications.  Half liter bolus ordered and approval for pain medication ordered.

## 2015-10-23 NOTE — Progress Notes (Signed)
Transferred patient to ICU 7.  Gave report to [facility].  Patient stated numerous times that she would inform her family of the transfer and nursing staff did not need to call to inform.

## 2015-10-23 NOTE — Consult Note (Signed)
PULMONARY / CRITICAL CARE MEDICINE   Name: Evelyn Willis MRN: [id number] DOB: 05/08/1956    ADMISSION DATE:  10/22/2015   CONSULTATION DATE: 10/23/15  REFERRING MD:  Hospitalist  CHIEF COMPLAINT: Bilateral chest wall pain  HISTORY OF PRESENT ILLNESS:   This is a 60 year old Caucasian female with a past medical history of non-small cell lung cancer-left, coronary artery disease, atrial fibrillation, COPD, current tobacco user, emphysema, depression, and multiple sclerosis who presents with bilateral chest wall pain. She describes pain as sharp, 10 on 10 on intensity, constant, worse with inspiration, and unrelieved by current pain medications. Pain is associated with a cough that is mildly productive of yellow sputum. CT angio in the ED was negative for PE but showed a complete left lung collapse, left supraclavicular adenopathy and an obstructing left hilar mass. Her O2 saturation is in the 90s on supplemental O2 by [place]. PCCM was consulted for a bronchoscopy  She is being followed by Dr. Shirleen Schirmer at at [facility] health for lung cancer. She states that she completed her chemotherapy and radiation treatments more than 6 months ago. She was last seen in follow up 6 months ago and a repeat CT of her chest was normal. Based on her records from [facility], her last CT chest was done on 04/27/2015 and showed a left hilar mass. Her last pulmonary function tests were done on 11/05/2014. She continues to smoke daily based on her records however, she reports quitting about a month.  Patient had nausea and vomiting on 5/19 and a CT abdomen revealed an obstructing kidney stone. She was due for a lithotripsy on 10/27/2015 and was seen by urology on 10/17/15.  Prior to 2 ED arrival, patient was on doxycycline, Augmentin and prednisone.  She is complaining of persistent chest wall pain and requesting more pain medications.   PAST MEDICAL HISTORY :  She  has a past medical history of Depression;  Multiple sclerosis ([place]); Coronary artery disease; Hematuria; Atrial fibrillation ([place]); Cancer ([place]) (2016); COPD (chronic obstructive pulmonary disease) ([place]); Emphysema lung ([place]); and Renal calculi.  PAST SURGICAL HISTORY: She  has past surgical history that includes stomach ulcer removal (2011) and Total hip arthroplasty (2011).  Allergies  Allergen Reactions  . Aspirin Other (See Comments)    Pt gets bleeding ulcers when she takes this medication Bleeding ulcers  . Nsaids Other (See Comments)    Other reaction(s): Other (See Comments) Cannot take due to significant peptic ulcer disease. Cannot take due to significant peptic ulcer disease.    No current facility-administered medications on file prior to encounter.   Current Outpatient Prescriptions on File Prior to Encounter  Medication Sig  . amiodarone (PACERONE) 200 MG tablet Take 1 tablet (200 mg total) by mouth 2 (two) times daily.  Marland Kitchen amitriptyline (ELAVIL) 75 MG tablet Take 75 mg by mouth at bedtime.  Marland Kitchen amoxicillin-clavulanate (AUGMENTIN) 875-125 MG tablet Take 1 tablet by mouth 2 (two) times daily.  Marland Kitchen buPROPion (WELLBUTRIN XL) 150 MG 24 hr tablet Take 1 tablet by mouth daily.  Marland Kitchen HYDROcodone-acetaminophen (NORCO) 10-325 MG tablet Take 1 tablet by mouth every 4 (four) hours as needed.  . metoprolol succinate (TOPROL XL) 25 MG 24 hr tablet Take 1 tablet (25 mg total) by mouth daily.  . mirtazapine (REMERON) 15 MG tablet Take 15 mg by mouth at bedtime.  Marland Kitchen morphine (MS CONTIN) 15 MG 12 hr tablet Take 1 tablet by mouth 2 (two) times daily.  Marland Kitchen SPIRIVA HANDIHALER  18 MCG inhalation capsule Place 1 capsule into inhaler and inhale daily.  Marland Kitchen albuterol (VENTOLIN HFA) 108 (90 Base) MCG/ACT inhaler Inhale 2 puffs into the lungs every 4 (four) hours as needed.  Marland Kitchen amphetamine-dextroamphetamine (ADDERALL) 20 MG tablet Take 20 mg by mouth daily. Reported on 09/23/2015    FAMILY HISTORY:  Her indicated that her mother is deceased. She  indicated that her father is alive.   SOCIAL HISTORY: She  reports that she quit smoking about 2 months ago. She does not have any smokeless tobacco history on file. She reports that she drinks alcohol. She reports that she does not use illicit drugs.  REVIEW OF SYSTEMS:   Constitutional: Negative for fever and chills but  positive for anorexia  HENT: Negative for congestion and rhinorrhea.  Eyes: Negative for redness and visual disturbance.  Respiratory: Positive for shortness of breath and wheezing.  Cardiovascular: Positive for chest wall pain , but negative palpitations and edema.  Gastrointestinal: Negative  for nausea , vomiting and abdominal pain and loose stools Genitourinary: Negative for dysuria and urgency.  Musculoskeletal: Negative for myalgias and arthralgias.  Skin: Negative for pallor and wound.  Neurological: Negative for dizziness and headaches   SUBJECTIVE:   VITAL SIGNS: BP 105/62 mmHg  Pulse 88  Temp(Src) 98.4 F (36.9 C) (Oral)  Resp 19  Ht '5\' 7"'$  (1.702 m)  Wt 118 lb 12.8 oz (53.887 kg)  BMI 18.60 kg/m2  SpO2 93%  HEMODYNAMICS:    VENTILATOR SETTINGS:    INTAKE / OUTPUT:    PHYSICAL EXAMINATION: General:  Cachectic and chronically ill-looking Neuro:  Alert and oriented 4, no focal deficits HEENT: PERRLA, oral mucosa dry, trachea midline Cardiovascular: Rate and rhythm regular, S1, S2 audible, no murmur, regurg or gallop Lungs: Normal work of breathing, bilateral airflow, breath sounds diminished in the bases bilaterally, left greater than right Abdomen:  Soft, nontender, nondistended, normal bowel sounds Musculoskeletal: No joint deformities, positive range of motion. Extremities: +2 pulses, no edema Skin:  Warm and dry, poor skin turgor  LABS:  BMET  Recent Labs Lab 10/22/15 2025  NA 136  K 3.7  CL 96*  CO2 30  BUN 20  CREATININE 0.89  GLUCOSE 126*    Electrolytes  Recent Labs Lab 10/22/15 2025  CALCIUM 9.4     CBC  Recent Labs Lab 10/22/15 2025  WBC 26.9*  HGB 13.8  HCT 43.0  PLT 472*    Coag's No results for input(s): APTT, INR in the last 168 hours.  Sepsis Markers  Recent Labs Lab 10/22/15 2200  LATICACIDVEN 0.7    ABG No results for input(s): PHART, PCO2ART, PO2ART in the last 168 hours.  Liver Enzymes  Recent Labs Lab 10/22/15 2025  AST 39  ALT 34  ALKPHOS 100  BILITOT 0.5  ALBUMIN 3.1*    Cardiac Enzymes  Recent Labs Lab 10/22/15 2025  TROPONINI <0.03    Glucose No results for input(s): GLUCAP in the last 168 hours.  Imaging Dg Chest 2 View  10/22/2015  CLINICAL DATA:  Chest pain EXAM: CHEST  2 VIEW COMPARISON:  10/12/2015 FINDINGS: Cardiac shadow is within normal limits. The right lung is hyperinflated and clear. Chronic changes are noted in the left hemi thorax similar to that noted on prior CT examination. This is consistent with the patient's given clinical history of lung carcinoma and treatment. Visualized upper abdomen is within normal limits. IMPRESSION: Chronic changes in the left hemi thorax consistent with the given clinical history  of carcinoma of the lung. Hyperinflation in the right lung without focal abnormality. Electronically Signed   By: Inez Catalina M.D.   On: 10/22/2015 21:00   Ct Angio Chest Pe W/cm &/or Wo Cm  10/22/2015  CLINICAL DATA:  Pleuritic chest pain.  Elevated D-dimer. EXAM: CT ANGIOGRAPHY CHEST WITH CONTRAST TECHNIQUE: Multidetector CT imaging of the chest was performed using the standard protocol during bolus administration of intravenous contrast. Multiplanar CT image reconstructions and MIPs were obtained to evaluate the vascular anatomy. CONTRAST:  75 cc Isovue 370 IV COMPARISON:  Chest radiograph earlier this date, as well as 08/06/2015 FINDINGS: There are no filling defects within the pulmonary arteries to suggest pulmonary embolus. Left lung branches limited secondary to atelectatic changes, no clinically significant  filling defect. Near complete collapse of the left lung with only minimal aerated upper lobe portion. There is complete occlusion of the left mainstem bronchus image 67 series 7. Left lung collapse limits detailed assessment, there is a probable hypodense lesion in the left hilum extending into the bronchus as cause of bronchial obstruction. Borders are difficult to define, however measures approximately 2.7 x 1.8 x 4.0 cm. Circumferential small left pleural effusion. Right lung hyperinflation, likely compensatory. Mild emphysema in the right lung. Minimal faint tree in bud opacities at the right lung base. Right bronchi are patent. There is no right pleural effusion. Moderate atherosclerosis of thoracic aorta. There is leftward mediastinal shift secondary to left lung volume loss. Coronary artery calcifications. Small volume pericardial fluid, difficult to differentiate from pleural fluid. Small prevascular lymph nodes for example 7 mm short axis image 47 series 5. Enlarged left supraclavicular node measures 1.5 by 1.6 cm image 27 series 5. Additional smaller supraclavicular nodes on the left. No right hilar adenopathy. Patulous esophagus with fluid level. No definite acute abnormality in the included upper abdomen. Adrenal glands partially included without nodule. Mild superior endplate compression deformity tentatively identified T11. No blastic or destructive lytic lesions. Review of the MIP images confirms the above findings. IMPRESSION: 1. No pulmonary embolus. 2. Near complete collapse of the left lung, with probable obstructing mass at the left hilum. There is complete left mainstem bronchial obstruction. Recommend bronchoscopy. Small left pleural effusion. 3. Left supraclavicular adenopathy, likely metastatic. 4. Hyperinflated right lung with emphysema. Tree in bud opacities in the right lower lobe may be infectious, inflammatory, or secondary to aspiration. Electronically Signed   By: Jeb Levering M.D.    On: 10/22/2015 22:39    STUDIES:  None  CULTURES: 10/22/2015 Blood cultures 2  ANTIBIOTICS: Cefepime 10/22/2015 Vancomycin 10/22/2015  SIGNIFICANT EVENTS: 10/22/2015: ED with pleuritic chest pain, admitted with post obstructive pneumonia, complete left lung collapse and obstructing left hilar mass.  LINES/TUBES: PIVs  DISCUSSION: 59 YO WF, current 1.5 pk daily smoker, COPD and lung cancer presenting with questionable recurrent lung mass, left lung collapse and possible post-obstructive pneumonia.   ASSESSMENT -Left lung collapse -Post-obstructive pneumonia -Recurrent lung mass -COPD -Tobacco use disorder  Plan -Will plan for bronchoscopy  -Empiric antibiotics  -Supplemental O2 -F/U cultures  Disposition and family update: No family at bedside. Patient updated on current treatment plan.   Best Practice: Code Status:  Full. Diet: Heart Healthy / Carb Mod. GI prophylaxis:  PPI. VTE prophylaxis:  SCD's / lovenox  Total CCM time 45 minutes  Magdalene S. [facility] ANP-BC Pulmonary and Critical Care Medicine [facility] Pager [phone] or [phone] 10/23/2015, 4:58 AM

## 2015-10-23 NOTE — Progress Notes (Signed)
eLink Physician-Brief Progress Note Patient Name: Evelyn Willis DOB: 1955-06-28 MRN: [id number]   Date of Service  10/23/2015  HPI/Events of Note  Requesting q 4h norco as at home   eICU Interventions  Changes norco to q4h prn     Intervention Category Minor Interventions: Routine modifications to care plan (e.g. PRN medications for pain, fever)  Christinia Gully 10/23/2015, 5:16 PM

## 2015-10-24 ENCOUNTER — Inpatient Hospital Stay: Payer: PPO

## 2015-10-24 DIAGNOSIS — F329 Major depressive disorder, single episode, unspecified: Secondary | ICD-10-CM

## 2015-10-24 DIAGNOSIS — C3492 Malignant neoplasm of unspecified part of left bronchus or lung: Secondary | ICD-10-CM

## 2015-10-24 DIAGNOSIS — I2510 Atherosclerotic heart disease of native coronary artery without angina pectoris: Secondary | ICD-10-CM

## 2015-10-24 DIAGNOSIS — R5383 Other fatigue: Secondary | ICD-10-CM

## 2015-10-24 DIAGNOSIS — D649 Anemia, unspecified: Secondary | ICD-10-CM

## 2015-10-24 DIAGNOSIS — Z87891 Personal history of nicotine dependence: Secondary | ICD-10-CM

## 2015-10-24 DIAGNOSIS — Z923 Personal history of irradiation: Secondary | ICD-10-CM

## 2015-10-24 DIAGNOSIS — I4891 Unspecified atrial fibrillation: Secondary | ICD-10-CM

## 2015-10-24 DIAGNOSIS — R531 Weakness: Secondary | ICD-10-CM

## 2015-10-24 DIAGNOSIS — Z9221 Personal history of antineoplastic chemotherapy: Secondary | ICD-10-CM

## 2015-10-24 DIAGNOSIS — Z87442 Personal history of urinary calculi: Secondary | ICD-10-CM

## 2015-10-24 LAB — BASIC METABOLIC PANEL
Anion gap: 3 — ABNORMAL LOW (ref 5–15)
BUN: 12 mg/dL (ref 6–20)
CALCIUM: 8.1 mg/dL — AB (ref 8.9–10.3)
CHLORIDE: 107 mmol/L (ref 101–111)
CO2: 28 mmol/L (ref 22–32)
CREATININE: 0.58 mg/dL (ref 0.44–1.00)
GFR calc Af Amer: >60 mL/min (ref >60)
GFR calc non Af Amer: >60 mL/min (ref >60)
GLUCOSE: 104 mg/dL — AB (ref 65–99)
Potassium: 3.9 mmol/L (ref 3.5–5.1)
Sodium: 138 mmol/L (ref 135–145)

## 2015-10-24 LAB — CBC
HCT: 32.9 % — ABNORMAL LOW (ref 35.0–47.0)
Hemoglobin: 10.5 g/dL — ABNORMAL LOW (ref 12.0–16.0)
MCH: 30.5 pg (ref 26.0–34.0)
MCHC: 32.1 g/dL (ref 32.0–36.0)
MCV: 95.2 fL (ref 80.0–100.0)
Platelets: 283 10*3/uL (ref 150–440)
RBC: 3.45 MIL/uL — ABNORMAL LOW (ref 3.80–5.20)
RDW: 14.8 % — AB (ref 11.5–14.5)
WBC: 11.5 10*3/uL — ABNORMAL HIGH (ref 3.6–11.0)

## 2015-10-24 LAB — MAGNESIUM: Magnesium: 1.9 mg/dL (ref 1.7–2.4)

## 2015-10-24 LAB — PHOSPHORUS: Phosphorus: 2.9 mg/dL (ref 2.5–4.6)

## 2015-10-24 MED ORDER — FENTANYL CITRATE (PF) 100 MCG/2ML IJ SOLN
INTRAMUSCULAR | Status: AC
  Filled 2015-10-24: qty 4

## 2015-10-24 MED ORDER — MIDAZOLAM HCL 5 MG/5ML IJ SOLN
INTRAMUSCULAR | Status: AC
  Filled 2015-10-24: qty 10

## 2015-10-24 MED ORDER — MIDAZOLAM HCL 2 MG/2ML IJ SOLN
INTRAMUSCULAR | Status: AC | PRN
  Administered 2015-10-24 (×3): 2 mg via INTRAVENOUS

## 2015-10-24 MED ORDER — SODIUM CHLORIDE 0.9 % IV BOLUS (SEPSIS)
500.0000 mL | Freq: Once | INTRAVENOUS | Status: AC
  Administered 2015-10-24: 500 mL via INTRAVENOUS

## 2015-10-24 MED ORDER — FENTANYL CITRATE (PF) 100 MCG/2ML IJ SOLN
INTRAMUSCULAR | Status: AC | PRN
  Administered 2015-10-24 (×3): 50 ug via INTRAVENOUS

## 2015-10-24 NOTE — Consult Note (Signed)
[place]  [phone] [phone]  ID: Evelyn Willis OB: [birth year]  MR#: [id number]  YIF#:[id number]  Patient Care Team: Derinda Late, MD as PCP - General (Family Medicine) Minna Merritts, MD as Consulting Physician (Cardiology)  CHIEF COMPLAINT:  Chief Complaint  Patient presents with  . 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 within the last 6 months. She admitted to the hospital with increasing shortness of breath and found to have left lung collapse due to obstruction as well as post obstructive pneumonia. CT scan was concerning for recurrence of her disease. No pulmonary embolism was noticed. Currently, patient still feels short of breath but otherwise improved from admission. She has no neurologic complaints. She denies any fevers. She has good appetite and denies weight loss. She does not complain of pain today. She has no nausea, vomiting, constipation, or diarrhea. She has no urinary complaints. Patient otherwise feels well and offers no further specific complaints.   REVIEW OF SYSTEMS:   Review of Systems  Constitutional: Positive for malaise/fatigue. Negative for fever and weight loss.  Respiratory: Positive for cough and shortness of breath. Negative for hemoptysis.   Cardiovascular: Negative.  Negative for chest pain and leg swelling.  Gastrointestinal: Negative.   Genitourinary: Negative.   Musculoskeletal: Negative.   Neurological: Positive for weakness.  Psychiatric/Behavioral: Negative.     As per HPI. Otherwise, a complete review of systems is negatve.  PAST MEDICAL HISTORY: Past Medical History  Diagnosis Date  . Depression   . Multiple sclerosis ([place])   . Coronary artery disease     said- stent was placed 10 yrs ago from now( 2017[place]  . Hematuria   . Atrial fibrillation ([place])   . Cancer [facility]) 2016    Left lung cancer, radiation, chemo  . COPD (chronic obstructive pulmonary disease) ([place])   . Emphysema lung ([place])   . Renal calculi     PAST SURGICAL HISTORY: Past Surgical History  Procedure Laterality Date  . Stomach ulcer removal  2011  . Total hip arthroplasty  2011    FAMILY HISTORY Family History  Problem Relation Age of Onset  . CAD Mother   . Atrial fibrillation Mother   . Heart attack Neg Hx   . Hypertension Neg Hx   . Stroke Neg Hx   . Kidney disease Neg Hx        ADVANCED DIRECTIVES:    HEALTH MAINTENANCE: Social History  Substance Use Topics  . Smoking status: Former Smoker -- 1.50 packs/day for 45 years    Quit date: 07/26/2015  . Smokeless tobacco: None     Comment: x 2 weeks  . Alcohol Use: 0.0 oz/week    0 Standard drinks or equivalent per week     Colonoscopy:  PAP:  Bone density:  Lipid panel:  Allergies  Allergen Reactions  . Aspirin Other (See Comments)    Pt gets bleeding ulcers when she takes this medication Bleeding ulcers  . Nsaids Other (See Comments)    Other reaction(s): Other (See Comments) Cannot take due to significant peptic ulcer disease. Cannot take due to significant peptic ulcer disease.    Current Facility-Administered Medications  Medication Dose Route Frequency Provider Last Rate Last Dose  . 0.9 %  sodium chloride infusion  250 mL Intravenous PRN Pavan  Pyreddy, MD      . acetaminophen (TYLENOL) tablet 650 mg  650 mg Oral Q6H PRN Saundra Shelling, MD   650 mg at 10/23/15 1604   Or  . acetaminophen (TYLENOL) suppository 650 mg  650 mg Rectal Q6H PRN Pavan Pyreddy, MD      . albuterol (PROVENTIL) (2.5 MG/3ML) 0.083% nebulizer solution 2.5 mg  2.5 mg Nebulization Q4H PRN Mikael Spray, NP      . amiodarone (PACERONE) tablet 200 mg  200 mg Oral BID Saundra Shelling, MD   200 mg at 10/24/15 0912  . amitriptyline (ELAVIL) tablet 75 mg  75 mg Oral QHS  Saundra Shelling, MD   75 mg at 10/23/15 2157  . amphetamine-dextroamphetamine (ADDERALL) tablet 20 mg  20 mg Oral Daily Saundra Shelling, MD   20 mg at 10/23/15 1106  . buPROPion (WELLBUTRIN XL) 24 hr tablet 150 mg  150 mg Oral Daily Saundra Shelling, MD   150 mg at 10/23/15 2157  . ceFEPIme (MAXIPIME) 1 g in dextrose 5 % 50 mL IVPB  1 g Intravenous Q8H Pavan Pyreddy, MD   1 g at 10/24/15 0353  . enoxaparin (LOVENOX) injection 40 mg  40 mg Subcutaneous Q0600 Saundra Shelling, MD   40 mg at 10/24/15 0618  . fentaNYL (SUBLIMAZE) 100 MCG/2ML injection           . HYDROcodone-acetaminophen (NORCO) 10-325 MG per tablet 1 tablet  1 tablet Oral Q4H PRN Tanda Rockers, MD   1 tablet at 10/24/15 0353  . ipratropium-albuterol (DUONEB) 0.5-2.5 (3) MG/3ML nebulizer solution 3 mL  3 mL Nebulization Q6H PRN Mikael Spray, NP      . midazolam (VERSED) 5 MG/5ML injection           . mirtazapine (REMERON) tablet 15 mg  15 mg Oral QHS Saundra Shelling, MD   15 mg at 10/23/15 2157  . morphine (MS CONTIN) 12 hr tablet 15 mg  15 mg Oral BID Saundra Shelling, MD   15 mg at 10/24/15 0912  . ondansetron (ZOFRAN) tablet 4 mg  4 mg Oral Q6H PRN Saundra Shelling, MD       Or  . ondansetron (ZOFRAN) injection 4 mg  4 mg Intravenous Q6H PRN Pavan Pyreddy, MD      . sodium chloride flush (NS) 0.9 % injection 3 mL  3 mL Intravenous Q12H Pavan Pyreddy, MD   3 mL at 10/23/15 2200  . sodium chloride flush (NS) 0.9 % injection 3 mL  3 mL Intravenous PRN Pavan Pyreddy, MD      . vancomycin (VANCOCIN) IVPB 750 mg/150 ml premix  750 mg Intravenous Q12H Pavan Pyreddy, MD   750 mg at 10/24/15 0618    OBJECTIVE: Filed Vitals:   10/24/15 1215 10/24/15 1221  BP:  100/55  Pulse: 88 85  Temp:    Resp: 13 22     Body mass index is 18.6 kg/(m^2).    ECOG FS:2 - Symptomatic, <50% confined to bed  General: Well-developed, well-nourished, no acute distress. Eyes: Pink conjunctiva, anicteric sclera. HEENT: Normocephalic, moist mucous membranes, clear  oropharnyx. Lungs: Diminished breath sounds on left.  Heart: Regular rate and rhythm. No rubs, murmurs, or gallops. Abdomen: Soft, nontender, nondistended. No organomegaly noted, normoactive bowel sounds. Musculoskeletal: No edema, cyanosis, or clubbing. Neuro: Alert, answering all questions appropriately. Cranial nerves grossly intact. Skin: No rashes or petechiae noted. Psych: Normal affect. Lymphatics: No cervical, calvicular, axillary or inguinal LAD.   LAB RESULTS:  Lab Results  Component Value Date   NA 138 10/24/2015   K 3.9 10/24/2015   CL 107 10/24/2015   CO2 28 10/24/2015   GLUCOSE 104* 10/24/2015   BUN 12 10/24/2015   CREATININE 0.58 10/24/2015   CALCIUM 8.1* 10/24/2015   PROT 7.4 10/22/2015   ALBUMIN 3.1* 10/22/2015   AST 39 10/22/2015   ALT 34 10/22/2015   ALKPHOS 100 10/22/2015   BILITOT 0.5 10/22/2015   GFRNONAA >60 10/24/2015   GFRAA >60 10/24/2015    Lab Results  Component Value Date   WBC 11.5* 10/24/2015   NEUTROABS 4.7 09/10/2009   HGB 10.5* 10/24/2015   HCT 32.9* 10/24/2015   MCV 95.2 10/24/2015   PLT 283 10/24/2015     STUDIES: Ct Abdomen Pelvis W Wo Contrast  10/12/2015  CLINICAL DATA:  Gross hematuria. Intermittent hematuria since March 2017. Taking beta blocker. History of gastric ulceration. Additional history of lung cancer with radiation chemotherapy EXAM: CT ABDOMEN AND PELVIS WITHOUT AND WITH CONTRAST TECHNIQUE: Multidetector CT imaging of the abdomen and pelvis was performed following the standard protocol before and following the bolus administration of intravenous contrast. CONTRAST:  175m ISOVUE-300 IOPAMIDOL (ISOVUE-300) INJECTION 61% COMPARISON:  CT 08/09/2009 FINDINGS: Lower chest: There is a LEFT pleural effusion and dense LEFT basilar atelectasis. RIGHT lung base is clear. Hepatobiliary: There is chronic intra and extrahepatic biliary duct dilatation. Postcholecystectomy. The common bile duct measures 10 mm through the pancreatic  head. There is mild dilatation of the pancreatic duct to 4 mm. These findings are similar to the slight progressed from CT of 08/10/1998 11. No obstructing lesions identified. No pancreatic atrophy or inflammation. Pancreas: Again the pancreatic duct is dilated to 4 mm increased from 2 mm on comparison exam were the duct was prominent. The pancreatic head lesion identified. No atrophy. Spleen: Normal spleen Adrenals/urinary tract: Adrenal glands are normal. There is hydronephrosis and hydroureter of the RIGHT renal collecting system. This is secondary to an obstructing calculus in the distal RIGHT ureter measuring 4 mm (image 61, series 4) this distal RIGHT ureteral calculus is faintly evident on the CT topogram. This calculus is approximately 6 cm from the vesicoureteral junction at the level of the inferior aspect the SI joints. There are 4 additional nonobstructing calculi in the RIGHT kidney. One 3 mm nonobstructing calculi in the LEFT kidney. No LEFT ureterolithiasis or obstructive uropathy. There is no enhancing renal lesion. No bladder calculi. There is significant streak artifact from the prosthetic on the LEFT no Stomach/Bowel: Stomach, small bowel, appendix, and cecum are normal. The colon and rectosigmoid colon are normal. Vascular/Lymphatic: Abdominal aorta is normal caliber. There is no retroperitoneal or periportal lymphadenopathy. No pelvic lymphadenopathy. Reproductive: Uterus poorly evaluated. Other: No free fluid. Musculoskeletal: No aggressive osseous lesion. IMPRESSION: 1. Obstructing calculus within the distal RIGHT ureter approximately 6 cm from the vesicoureteral junction. 2. Bilateral nephrolithiasis. 3. Chronic dilatation of the intra and extrahepatic bile ducts to the level of the ampulla is likely benign dilatation. Recommend correlation with bilirubin levels. 4. Dense LEFT basilar atelectasis and pleural effusion. These results will be called to the ordering clinician or representative by  the Radiologist Assistant, and communication documented in the PACS or zVision Dashboard. Electronically Signed   By: SSuzy BouchardM.D.   On: 10/12/2015 15:51   Dg Chest 2 View  10/22/2015  CLINICAL DATA:  Chest pain EXAM: CHEST  2 VIEW COMPARISON:  10/12/2015 FINDINGS: Cardiac shadow is within normal limits. The right lung is hyperinflated  and clear. Chronic changes are noted in the left hemi thorax similar to that noted on prior CT examination. This is consistent with the patient's given clinical history of lung carcinoma and treatment. Visualized upper abdomen is within normal limits. IMPRESSION: Chronic changes in the left hemi thorax consistent with the given clinical history of carcinoma of the lung. Hyperinflation in the right lung without focal abnormality. Electronically Signed   By: Inez Catalina M.D.   On: 10/22/2015 21:00   Dg Abd 1 View  10/17/2015  CLINICAL DATA:  60 year old female -evaluate right ureteral calculus. EXAM: ABDOMEN - 1 VIEW COMPARISON:  10/12/2015 CT FINDINGS: A 6 mm calcification overlying the mid -distal right ureter is again identified compatible with known right ureteral calculus. There are adjacent vascular calcifications noted. The bowel gas pattern is unremarkable. Cholecystectomy clips and right hip arthroplasty noted. IMPRESSION: 6 mm mid -distal right ureteral calculus again identified. Adjacent vascular calcifications noted. Electronically Signed   By: Margarette Canada M.D.   On: 10/17/2015 16:49   Ct Angio Chest Pe W/cm &/or Wo Cm  10/22/2015  CLINICAL DATA:  Pleuritic chest pain.  Elevated D-dimer. EXAM: CT ANGIOGRAPHY CHEST WITH CONTRAST TECHNIQUE: Multidetector CT imaging of the chest was performed using the standard protocol during bolus administration of intravenous contrast. Multiplanar CT image reconstructions and MIPs were obtained to evaluate the vascular anatomy. CONTRAST:  75 cc Isovue 370 IV COMPARISON:  Chest radiograph earlier this date, as well as  08/06/2015 FINDINGS: There are no filling defects within the pulmonary arteries to suggest pulmonary embolus. Left lung branches limited secondary to atelectatic changes, no clinically significant filling defect. Near complete collapse of the left lung with only minimal aerated upper lobe portion. There is complete occlusion of the left mainstem bronchus image 67 series 7. Left lung collapse limits detailed assessment, there is a probable hypodense lesion in the left hilum extending into the bronchus as cause of bronchial obstruction. Borders are difficult to define, however measures approximately 2.7 x 1.8 x 4.0 cm. Circumferential small left pleural effusion. Right lung hyperinflation, likely compensatory. Mild emphysema in the right lung. Minimal faint tree in bud opacities at the right lung base. Right bronchi are patent. There is no right pleural effusion. Moderate atherosclerosis of thoracic aorta. There is leftward mediastinal shift secondary to left lung volume loss. Coronary artery calcifications. Small volume pericardial fluid, difficult to differentiate from pleural fluid. Small prevascular lymph nodes for example 7 mm short axis image 47 series 5. Enlarged left supraclavicular node measures 1.5 by 1.6 cm image 27 series 5. Additional smaller supraclavicular nodes on the left. No right hilar adenopathy. Patulous esophagus with fluid level. No definite acute abnormality in the included upper abdomen. Adrenal glands partially included without nodule. Mild superior endplate compression deformity tentatively identified T11. No blastic or destructive lytic lesions. Review of the MIP images confirms the above findings. IMPRESSION: 1. No pulmonary embolus. 2. Near complete collapse of the left lung, with probable obstructing mass at the left hilum. There is complete left mainstem bronchial obstruction. Recommend bronchoscopy. Small left pleural effusion. 3. Left supraclavicular adenopathy, likely metastatic. 4.  Hyperinflated right lung with emphysema. Tree in bud opacities in the right lower lobe may be infectious, inflammatory, or secondary to aspiration. Electronically Signed   By: Jeb Levering M.D.   On: 10/22/2015 22:39   Dg Chest Port 1 View  10/24/2015  CLINICAL DATA:  Left lung consolidation EXAM: PORTABLE CHEST 1 VIEW COMPARISON:  10/22/2015 FINDINGS: Right lung is  hyperinflated. Some mediastinal shift to the left is noted with significant volume loss on the left. The minimally aerated the left lung now demonstrates complete consolidation. Pleural effusion is present as well based on recent CT. No bony abnormality is noted. IMPRESSION: Continued consolidation of the left lung with mediastinal shift from right to left. Abrupt cut off of the left mainstem bronchus is noted likely related to obstructing mass. Electronically Signed   By: Inez Catalina M.D.   On: 10/24/2015 07:47    ASSESSMENT: History of squamous cell carcinoma of the lung (stage IIa, T1b,N1,M0), now with lung collapse and postobstructive pneumonia.  PLAN:    1. Lung cancer: Patient was initially diagnosed with squamous cell carcinoma the lung in June 2016. PET scan at that time revealed a 2.9 cm left hilar mass with no evidence of lymphadenopathy or distant metastasis. She was stage IIa disease based on one level one lymph node that returned positive for squamous cell on EBUS. She proceeded with concurrent chemotherapy and [facility] She initially was started on carboplatinum and Taxol. Her XRT started on December 09, 2014 and she completed 6600 cGy in 33 fractions. She had a Taxol infusion reaction on cycle 4 and was switched to Abraxane '25mg'$ /m2 for cycle 5. Patient completed 7 weekly cycles of chemotherapy in approximately November 2016.  By report, post-treatment scan revealed "stable disease". Patient was scheduled to have follow-up CT scan and evaluation by her oncologist at [facility], Oct 26, 2015.  She  has stated she wishes to transfer her care to [facility]. Bronchoscopy was performed earlier and results are pending at time of evaluation. No further intervention is needed at this time. Will wait bronchoscopy results. Patient can follow-up in the [facility] in [place] in 1-2 weeks to discuss the results and any additional treatment planning if necessary. 2. Multiple sclerosis: Managed at [facility]. By report patient no longer sees her neurologist but continues at pain clinic at [facility]. 3. Postoperative pneumonia: Possibly related to recurrence of disease. Bronchoscopy as above. Agree with current antibiotics. 4. Anemia: Mild, monitor.  Appreciate consult, will follow.  Lloyd Huger, MD   10/24/2015 12:48 PM

## 2015-10-24 NOTE — Op Note (Addendum)
[facility] Patient Name: Evelyn Willis Procedure Date: 10/24/2015 11:29 AM MRN: [id number] Account #: [id number] Admit Type: Inpatient Room: [place] Suite on 2nd floor Note Status: Finalized Attending MD: Marda Stalker ,  Procedure:         Bronchoscopy Indications:       Lung mass Providers:         Deep Jiovanny Burdell Referring MD:       Medicines:         Midazolam 6 mg IV, Fentanyl 50 mcg IV Complications:     No immediate complications Procedure:         Pre-Anesthesia Assessment:                    - A History and Physical has been performed. Patient meds                     and allergies have been reviewed. The risks and benefits                     of the procedure and the sedation options and risks were                     discussed with the patient. All questions were answered                     and informed consent was obtained. Patient identification                     and proposed procedure were verified prior to the                     procedure by the nurse. Mental Status Examination: alert                     and oriented. Airway Examination: Mallampati Class III                     (part of the uvula and soft palate visualized).                     Respiratory Examination: poor air movement and poor air                     movement in the left lung. CV Examination: normal. ASA                     Grade Assessment: III - A patient with severe systemic                     disease. After reviewing the risks and benefits, the                     patient was deemed in satisfactory condition to undergo                     the procedure. The anesthesia plan was to use moderate                     sedation / analgesia (conscious sedation). Immediately                     prior to administration of medications, the patient was  re-assessed for adequacy to receive sedatives. The heart                     rate, respiratory rate, oxygen  saturations, blood                     pressure, adequacy of pulmonary ventilation, and response                     to care were monitored throughout the procedure. The                     physical status of the patient was re-assessed after the                     procedure.                    After obtaining informed consent, the bronchoscope was                     passed under direct vision. Throughout the procedure, the                     patient's blood pressure, pulse, and oxygen saturations                     were monitored continuously. the Bronchoscope Olympus                     [id number] was introduced through the mouth, via                     the endotracheal tube and advanced to the tracheobronchial                     tree of both lungs. The procedure was accomplished without                     difficulty. The patient tolerated the procedure fairly                     well. The total duration of the procedure was 10 minutes. Findings:      Bronchoalveolar lavage was performed in the lung. Impression:        - Bronchoalveolar lavage was performed.                    - The examination was normal. Recommendation:    - Await test and BAL results.                    - Await test and biopsy results. Deep Earlee Herald,  10/24/2015 11:54:32 AM Number of Addenda: 0 Note Initiated On: 10/24/2015 11:29 AM      [facility]

## 2015-10-24 NOTE — Progress Notes (Signed)
Return from bronchoscopy. Tolerated procedure well.

## 2015-10-24 NOTE — Care Management Note (Signed)
Case Management Note  Patient Details  Name: Evelyn Willis MRN: [id number] Date of Birth: 07/19/1955  Subjective/Objective:                  Met with patient to discuss discharge planning. She stays with her 61 year old father that she states is able to care for himself. She states that he is oriented and walks with a cane. Her sister (which has Parkinson's) is checking on him while patient is in the hospital. Her PCP is Dr. Loney Hering. Patient is not currently requiring O2 and is not on it at home (lung cancer dx). She is from home, independent, drives. She denies difficulty obtaining meds.    Action/Plan: No anticipated RNCM needs.   Expected Discharge Date:  10/26/15               Expected Discharge Plan:     In-House Referral:     Discharge planning Services  CM Consult  Post Acute Care Choice:  Home Health Choice offered to:  Patient  DME Arranged:    DME Agency:     HH Arranged:    [place] Agency:     Status of Service:  In process, will continue to follow  Medicare Important Message Given:    Date Medicare IM Given:    Medicare IM give by:    Date Additional Medicare IM Given:    Additional Medicare Important Message give by:     If discussed at [place] of Stay Meetings, dates discussed:    Additional Comments:  Marshell Garfinkel, RN 10/24/2015, 9:53 AM

## 2015-10-24 NOTE — Progress Notes (Signed)
Patient ID: Evelyn Willis, female   DOB: July 25, 1955, 60 y.o.   MRN: [id number] [facility] PROGRESS NOTE  Evelyn Willis YBW:[id number] DOB: August 28, 1955 DOA: 10/22/2015 PCP: Marcello Fennel, MD  HPI/Subjective: Patient after bronchoscopy feels less chest discomfort. She feels like she is breathing a little bit better.  Objective: Filed Vitals:   10/24/15 1400 10/24/15 1500  BP: 97/64 100/76  Pulse: 77 85  Temp:    Resp: 16 17    Filed Weights   10/22/15 1824 10/23/15 0319  Weight: 53.524 kg (118 lb) 53.887 kg (118 lb 12.8 oz)    ROS: Review of Systems  Constitutional: Negative for fever and chills.  Eyes: Negative for blurred vision.  Respiratory: Positive for cough and shortness of breath.   Cardiovascular: Positive for chest pain.  Gastrointestinal: Negative for nausea, vomiting, abdominal pain, diarrhea and constipation.  Genitourinary: Negative for dysuria.  Musculoskeletal: Negative for joint pain.  Neurological: Negative for dizziness and headaches.   Exam: Physical Exam  Constitutional: She is oriented to person, place, and time.  HENT:  Nose: No mucosal edema.  Mouth/Throat: No oropharyngeal exudate or posterior oropharyngeal edema.  Eyes: Conjunctivae, EOM and lids are normal. Pupils are equal, round, and reactive to light.  Neck: No JVD present. Carotid bruit is not present. No edema present. No thyroid mass and no thyromegaly present.  Cardiovascular: S1 normal and S2 normal.  Exam reveals no gallop.   No murmur heard. Pulses:      Dorsalis pedis pulses are 2+ on the right side, and 2+ on the left side.  Respiratory: No respiratory distress. She has decreased breath sounds in the left middle field and the left lower field. She has no wheezes. She has no rhonchi. She has no rales.  GI: Soft. Bowel sounds are normal. There is no tenderness.  Musculoskeletal:       Right ankle: She exhibits no swelling.       Left ankle: She exhibits no swelling.   Lymphadenopathy:    She has no cervical adenopathy.  Neurological: She is alert and oriented to person, place, and time. No cranial nerve deficit.  Skin: Skin is warm. No rash noted. Nails show no clubbing.  Psychiatric: She has a normal mood and affect.      Data Reviewed: Basic Metabolic Panel:  Recent Labs Lab 10/22/15 2025 10/23/15 0517 10/24/15 0502  NA 136 136 138  K 3.7 4.0 3.9  CL 96* 103 107  CO2 '30 28 28  '$ GLUCOSE 126* 120* 104*  BUN '20 13 12  '$ CREATININE 0.89 0.60 0.58  CALCIUM 9.4 8.3* 8.1*  MG  --   --  1.9  PHOS  --   --  2.9   Liver Function Tests:  Recent Labs Lab 10/22/15 2025  AST 39  ALT 34  ALKPHOS 100  BILITOT 0.5  PROT 7.4  ALBUMIN 3.1*   CBC:  Recent Labs Lab 10/22/15 2025 10/23/15 0517 10/24/15 0502  WBC 26.9* 21.9* 11.5*  HGB 13.8 11.3* 10.5*  HCT 43.0 35.1 32.9*  MCV 94.4 94.6 95.2  PLT 472* 331 283   Cardiac Enzymes:  Recent Labs Lab 10/22/15 2025  TROPONINI <0.03   CBG:  Recent Labs Lab 10/23/15 0905  GLUCAP 98       Studies: Dg Chest 2 View  10/22/2015  CLINICAL DATA:  Chest pain EXAM: CHEST  2 VIEW COMPARISON:  10/12/2015 FINDINGS: Cardiac shadow is within normal limits. The right lung is hyperinflated and clear.  Chronic changes are noted in the left hemi thorax similar to that noted on prior CT examination. This is consistent with the patient's given clinical history of lung carcinoma and treatment. Visualized upper abdomen is within normal limits. IMPRESSION: Chronic changes in the left hemi thorax consistent with the given clinical history of carcinoma of the lung. Hyperinflation in the right lung without focal abnormality. Electronically Signed   By: Inez Catalina M.D.   On: 10/22/2015 21:00   Ct Angio Chest Pe W/cm &/or Wo Cm  10/22/2015  CLINICAL DATA:  Pleuritic chest pain.  Elevated D-dimer. EXAM: CT ANGIOGRAPHY CHEST WITH CONTRAST TECHNIQUE: Multidetector CT imaging of the chest was performed using the  standard protocol during bolus administration of intravenous contrast. Multiplanar CT image reconstructions and MIPs were obtained to evaluate the vascular anatomy. CONTRAST:  75 cc Isovue 370 IV COMPARISON:  Chest radiograph earlier this date, as well as 08/06/2015 FINDINGS: There are no filling defects within the pulmonary arteries to suggest pulmonary embolus. Left lung branches limited secondary to atelectatic changes, no clinically significant filling defect. Near complete collapse of the left lung with only minimal aerated upper lobe portion. There is complete occlusion of the left mainstem bronchus image 67 series 7. Left lung collapse limits detailed assessment, there is a probable hypodense lesion in the left hilum extending into the bronchus as cause of bronchial obstruction. Borders are difficult to define, however measures approximately 2.7 x 1.8 x 4.0 cm. Circumferential small left pleural effusion. Right lung hyperinflation, likely compensatory. Mild emphysema in the right lung. Minimal faint tree in bud opacities at the right lung base. Right bronchi are patent. There is no right pleural effusion. Moderate atherosclerosis of thoracic aorta. There is leftward mediastinal shift secondary to left lung volume loss. Coronary artery calcifications. Small volume pericardial fluid, difficult to differentiate from pleural fluid. Small prevascular lymph nodes for example 7 mm short axis image 47 series 5. Enlarged left supraclavicular node measures 1.5 by 1.6 cm image 27 series 5. Additional smaller supraclavicular nodes on the left. No right hilar adenopathy. Patulous esophagus with fluid level. No definite acute abnormality in the included upper abdomen. Adrenal glands partially included without nodule. Mild superior endplate compression deformity tentatively identified T11. No blastic or destructive lytic lesions. Review of the MIP images confirms the above findings. IMPRESSION: 1. No pulmonary embolus. 2.  Near complete collapse of the left lung, with probable obstructing mass at the left hilum. There is complete left mainstem bronchial obstruction. Recommend bronchoscopy. Small left pleural effusion. 3. Left supraclavicular adenopathy, likely metastatic. 4. Hyperinflated right lung with emphysema. Tree in bud opacities in the right lower lobe may be infectious, inflammatory, or secondary to aspiration. Electronically Signed   By: Jeb Levering M.D.   On: 10/22/2015 22:39   Dg Chest Port 1 View  10/24/2015  CLINICAL DATA:  Left lung consolidation EXAM: PORTABLE CHEST 1 VIEW COMPARISON:  10/22/2015 FINDINGS: Right lung is hyperinflated. Some mediastinal shift to the left is noted with significant volume loss on the left. The minimally aerated the left lung now demonstrates complete consolidation. Pleural effusion is present as well based on recent CT. No bony abnormality is noted. IMPRESSION: Continued consolidation of the left lung with mediastinal shift from right to left. Abrupt cut off of the left mainstem bronchus is noted likely related to obstructing mass. Electronically Signed   By: Inez Catalina M.D.   On: 10/24/2015 07:47    Scheduled Meds: . amiodarone  200 mg Oral BID  .  amitriptyline  75 mg Oral QHS  . amphetamine-dextroamphetamine  20 mg Oral Daily  . buPROPion  150 mg Oral Daily  . ceFEPime (MAXIPIME) IV  1 g Intravenous Q8H  . enoxaparin (LOVENOX) injection  40 mg Subcutaneous Q0600  . fentaNYL      . midazolam      . mirtazapine  15 mg Oral QHS  . morphine  15 mg Oral BID  . sodium chloride flush  3 mL Intravenous Q12H  . vancomycin  750 mg Intravenous Q12H    Assessment/Plan:  1. Clinical sepsis present on admission with leukocytosis, tachycardia and postobstructive pneumonia. Empiric antibiotics with vancomycin and Maxipime. 2. 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 Bronchoscopy showing obstructive lesion in the left mainstem bronchus. Biopsies taken by Dr.  Ashby Dawes. Will need oncology follow-up as outpatient. 3. Atrial fibrillation. Rate controlled with amiodarone 4. Hypotension hold antihypertensive medications 5. Chronic pain on morphine 6. Depression on Wellbutrin 7. Try to taper oxygen to off. Potentially can get out of the ICU today.  Code Status:     Code Status Orders        Start     Ordered   10/23/15 0320  Full code   Continuous     10/23/15 0319    Code Status History    Date Active Date Inactive Code Status Order ID Comments User Context   08/06/2015 11:33 AM 08/07/2015  8:44 PM Full Code [id number]  Vaughan Basta, MD Inpatient     Family Communication: Family at bedside Disposition Plan: Hopefully home in the next couple days  Consultants:  Critical care specialist  Oncology  Antibiotics:  Vancomycin  Zosyn  Time spent: 20 minutes  [place], [place]

## 2015-10-24 NOTE — Progress Notes (Signed)
Pt transferred this evening from ICU. VSS. Norco given for generalized chest pain.

## 2015-10-24 NOTE — Progress Notes (Addendum)
PULMONARY / CRITICAL CARE MEDICINE   Name: Evelyn Willis MRN: [id number] DOB: 02-13-1956    ADMISSION DATE:  10/22/2015   CONSULTATION DATE: 10/23/15  REFERRING MD:  Hospitalist  CHIEF COMPLAINT: Bilateral chest wall pain   DISCUSSION: 60 YO WF, current 1.5 pk daily smoker, COPD and lung cancer presenting with questionable recurrent lung mass, left lung collapse and possible post-obstructive pneumonia.      Allergies  Allergen Reactions  . Aspirin Other (See Comments)    Pt gets bleeding ulcers when she takes this medication Bleeding ulcers  . Nsaids Other (See Comments)    Other reaction(s): Other (See Comments) Cannot take due to significant peptic ulcer disease. Cannot take due to significant peptic ulcer disease.    No current facility-administered medications on file prior to encounter.   Current Outpatient Prescriptions on File Prior to Encounter  Medication Sig  . amiodarone (PACERONE) 200 MG tablet Take 1 tablet (200 mg total) by mouth 2 (two) times daily.  Marland Kitchen amitriptyline (ELAVIL) 75 MG tablet Take 75 mg by mouth at bedtime.  Marland Kitchen amoxicillin-clavulanate (AUGMENTIN) 875-125 MG tablet Take 1 tablet by mouth 2 (two) times daily.  Marland Kitchen buPROPion (WELLBUTRIN XL) 150 MG 24 hr tablet Take 1 tablet by mouth daily.  Marland Kitchen HYDROcodone-acetaminophen (NORCO) 10-325 MG tablet Take 1 tablet by mouth every 4 (four) hours as needed.  . metoprolol succinate (TOPROL XL) 25 MG 24 hr tablet Take 1 tablet (25 mg total) by mouth daily.  . mirtazapine (REMERON) 15 MG tablet Take 15 mg by mouth at bedtime.  Marland Kitchen morphine (MS CONTIN) 15 MG 12 hr tablet Take 1 tablet by mouth 2 (two) times daily.  Marland Kitchen SPIRIVA HANDIHALER 18 MCG inhalation capsule Place 1 capsule into inhaler and inhale daily.  Marland Kitchen albuterol (VENTOLIN HFA) 108 (90 Base) MCG/ACT inhaler Inhale 2 puffs into the lungs every 4 (four) hours as needed.  Marland Kitchen amphetamine-dextroamphetamine (ADDERALL) 20 MG tablet Take 20 mg by mouth daily. Reported  on 09/23/2015    FAMILY HISTORY:  Her indicated that her mother is deceased. She indicated that her father is alive.   SOCIAL HISTORY: She  reports that she quit smoking about 2 months ago. She does not have any smokeless tobacco history on file. She reports that she drinks alcohol. She reports that she does not use illicit drugs.  REVIEW OF SYSTEMS:   Constitutional: Negative for fever and chills but  positive for anorexia  HENT: Negative for congestion and rhinorrhea.  Eyes: Negative for redness and visual disturbance.  Respiratory: Positive for shortness of breath and wheezing.  Cardiovascular: Positive for chest wall pain , but negative palpitations and edema.  Gastrointestinal: Negative  for nausea , vomiting and abdominal pain and loose stools Genitourinary: Negative for dysuria and urgency.  Musculoskeletal: Negative for myalgias and arthralgias.  Skin: Negative for pallor and wound.  Neurological: Negative for dizziness and headaches   SUBJECTIVE:   VITAL SIGNS: BP 100/55 mmHg  Pulse 85  Temp(Src) 98.4 F (36.9 C) (Oral)  Resp 22  Ht '5\' 7"'$  (1.702 m)  Wt 53.887 kg (118 lb 12.8 oz)  BMI 18.60 kg/m2  SpO2 94%  HEMODYNAMICS:    VENTILATOR SETTINGS:    INTAKE / OUTPUT: I/O last 3 completed shifts: In: 94 [P.O.:120; IV Piggyback:950] Out: -   PHYSICAL EXAMINATION: General:  Cachectic and chronically ill-looking Neuro:  Alert and oriented 4, no focal deficits HEENT: PERRLA, oral mucosa dry, trachea midline Cardiovascular: Rate and rhythm regular, S1, S2 audible,  no murmur, regurg or gallop Lungs: Normal work of breathing, bilateral airflow, breath sounds diminished in the bases bilaterally, left greater than right Abdomen:  Soft, nontender, nondistended, normal bowel sounds Musculoskeletal: No joint deformities, positive range of motion. Extremities: +2 pulses, no edema Skin:  Warm and dry, poor skin turgor  LABS:  BMET  Recent Labs Lab 10/22/15 2025  10/23/15 0517 10/24/15 0502  NA 136 136 138  K 3.7 4.0 3.9  CL 96* 103 107  CO2 '30 28 28  '$ BUN '20 13 12  '$ CREATININE 0.89 0.60 0.58  GLUCOSE 126* 120* 104*    Electrolytes  Recent Labs Lab 10/22/15 2025 10/23/15 0517 10/24/15 0502  CALCIUM 9.4 8.3* 8.1*  MG  --   --  1.9  PHOS  --   --  2.9    CBC  Recent Labs Lab 10/22/15 2025 10/23/15 0517 10/24/15 0502  WBC 26.9* 21.9* 11.5*  HGB 13.8 11.3* 10.5*  HCT 43.0 35.1 32.9*  PLT 472* 331 283    Coag's No results for input(s): APTT, INR in the last 168 hours.  Sepsis Markers  Recent Labs Lab 10/22/15 2200  LATICACIDVEN 0.7    ABG No results for input(s): PHART, PCO2ART, PO2ART in the last 168 hours.  Liver Enzymes  Recent Labs Lab 10/22/15 2025  AST 39  ALT 34  ALKPHOS 100  BILITOT 0.5  ALBUMIN 3.1*    Cardiac Enzymes  Recent Labs Lab 10/22/15 2025  TROPONINI <0.03    Glucose  Recent Labs Lab 10/23/15 0905  GLUCAP 98    Imaging Dg Chest Port 1 View  10/24/2015  CLINICAL DATA:  Left lung consolidation EXAM: PORTABLE CHEST 1 VIEW COMPARISON:  10/22/2015 FINDINGS: Right lung is hyperinflated. Some mediastinal shift to the left is noted with significant volume loss on the left. The minimally aerated the left lung now demonstrates complete consolidation. Pleural effusion is present as well based on recent CT. No bony abnormality is noted. IMPRESSION: Continued consolidation of the left lung with mediastinal shift from right to left. Abrupt cut off of the left mainstem bronchus is noted likely related to obstructing mass. Electronically Signed   By: Inez Catalina M.D.   On: 10/24/2015 07:47    STUDIES:  None  CULTURES: 10/22/2015 Blood cultures 2  ANTIBIOTICS: Cefepime 10/22/2015 Vancomycin 10/22/2015  SIGNIFICANT EVENTS: 10/22/2015: ED with pleuritic chest pain, admitted with post obstructive pneumonia, complete left lung collapse and obstructing left hilar  mass.  LINES/TUBES: PIVs  DISCUSSION: 60 YO WF, current 1.5 pk daily smoker, COPD and lung cancer presenting with questionable recurrent lung mass, left lung collapse and possible post-obstructive pneumonia.   ASSESSMENT -Left lung collapse -Post-obstructive pneumonia -Recurrent lung mass -COPD -Tobacco abuse  Plan -Will plan for bronchoscopy  -Empiric antibiotics  -Supplemental O2 -F/U cultures -Bronchoscopy on 10/24/15  Disposition and family update: No family at bedside. Patient updated on current treatment plan.   Best Practice: Code Status:  Full. Diet: Heart Healthy / Carb Mod. GI prophylaxis:  PPI. VTE prophylaxis:  SCD's / lovenox  Patient seen and examined with NP, agree with assessment and plan. On physical exam there is decreased entry in the left lung. Bronchoscopy was performed, this revealed a completely obstructing left mainstem endobronchial tumor. Forceps biopsies and washings were taken, results are pending.  10/24/2015   Critical Care Attestation.  I have personally obtained a history, examined the patient, evaluated laboratory and imaging results, formulated the assessment and plan and placed orders. The Patient requires  high complexity decision making for assessment and support, frequent evaluation and titration of therapies, application of advanced monitoring technologies and extensive interpretation of multiple databases. The patient has critical illness that could lead imminently to failure of 1 or more organ systems and requires the highest level of physician preparedness to intervene.  Critical Care Time devoted to patient care services described in this note is 35 minutes and is exclusive of time spent in procedures.

## 2015-10-24 NOTE — Progress Notes (Signed)
Pharmacy Antibiotic Note  Evelyn Willis is a 61 y.o. female admitted on 10/22/2015 with pneumonia.  Pharmacy has been consulted for cefepime and vancomycin dosing.  Plan: Continue Vancomycin 750 mg IV q12 hour dosing. Trough level prior to the 06:00 dose on 5/30.   Continue Cefepime 1 g IV q8 hours.   Height: '5\' 7"'$  (170.2 cm) Weight: 118 lb 12.8 oz (53.887 kg) IBW/kg (Calculated) : 61.6  Temp (24hrs), Avg:98.1 F (36.7 C), Min:97 F (36.1 C), Max:98.6 F (37 C)   Recent Labs Lab 10/22/15 2025 10/22/15 2200 10/23/15 0517 10/24/15 0502  WBC 26.9*  --  21.9* 11.5*  CREATININE 0.89  --  0.60 0.58  LATICACIDVEN  --  0.7  --   --     Estimated Creatinine Clearance: 62.8 mL/min (by C-G formula based on Cr of 0.58).    Allergies  Allergen Reactions  . Aspirin Other (See Comments)    Pt gets bleeding ulcers when she takes this medication Bleeding ulcers  . Nsaids Other (See Comments)    Other reaction(s): Other (See Comments) Cannot take due to significant peptic ulcer disease. Cannot take due to significant peptic ulcer disease.    Antimicrobials this admission:   >>    >>   Dose adjustments this admission: Cefepime 1 g IV q8 hours.   Microbiology results:  BCx: pending  UCx:  Pending   Sputum:    MRSA PCR: negative  Thank you for allowing pharmacy to be a part of this patient's care.  Jaron Czarnecki C 10/24/2015 2:17 PM

## 2015-10-24 NOTE — Progress Notes (Signed)
To special procedures for bronchoscopy.  Consent on chart.

## 2015-10-25 ENCOUNTER — Telehealth: Payer: Self-pay | Admitting: *Deleted

## 2015-10-25 ENCOUNTER — Telehealth: Payer: Self-pay | Admitting: Radiology

## 2015-10-25 DIAGNOSIS — N200 Calculus of kidney: Secondary | ICD-10-CM

## 2015-10-25 LAB — BASIC METABOLIC PANEL
Anion gap: 5 (ref 5–15)
BUN: 9 mg/dL (ref 6–20)
CALCIUM: 8.5 mg/dL — AB (ref 8.9–10.3)
CO2: 28 mmol/L (ref 22–32)
CREATININE: 0.66 mg/dL (ref 0.44–1.00)
Chloride: 104 mmol/L (ref 101–111)
GFR calc Af Amer: >60 mL/min (ref >60)
GLUCOSE: 101 mg/dL — AB (ref 65–99)
Potassium: 3.6 mmol/L (ref 3.5–5.1)
Sodium: 137 mmol/L (ref 135–145)

## 2015-10-25 LAB — CBC
HCT: 33.1 % — ABNORMAL LOW (ref 35.0–47.0)
Hemoglobin: 11 g/dL — ABNORMAL LOW (ref 12.0–16.0)
MCH: 30.8 pg (ref 26.0–34.0)
MCHC: 33.2 g/dL (ref 32.0–36.0)
MCV: 92.6 fL (ref 80.0–100.0)
PLATELETS: 297 10*3/uL (ref 150–440)
RBC: 3.57 MIL/uL — ABNORMAL LOW (ref 3.80–5.20)
RDW: 14.6 % — AB (ref 11.5–14.5)
WBC: 10.3 10*3/uL (ref 3.6–11.0)

## 2015-10-25 LAB — VANCOMYCIN, TROUGH: Vancomycin Tr: 13 ug/mL (ref 10–20)

## 2015-10-25 MED ORDER — LEVOFLOXACIN 500 MG PO TABS
500.0000 mg | ORAL_TABLET | Freq: Every day | ORAL | Status: DC
  Administered 2015-10-25: 500 mg via ORAL
  Filled 2015-10-25: qty 1

## 2015-10-25 MED ORDER — VANCOMYCIN HCL IN DEXTROSE 1-5 GM/200ML-% IV SOLN
1000.0000 mg | Freq: Two times a day (BID) | INTRAVENOUS | Status: DC
  Filled 2015-10-25: qty 200

## 2015-10-25 MED ORDER — HYDROCODONE-ACETAMINOPHEN 10-325 MG PO TABS: 1.0000 | ORAL_TABLET | ORAL | Status: DC | PRN

## 2015-10-25 MED ORDER — LEVOFLOXACIN 500 MG PO TABS: 500.0000 mg | ORAL_TABLET | Freq: Every day | ORAL | Status: DC

## 2015-10-25 NOTE — Telephone Encounter (Signed)
-----   Message from Nori Riis, PA-C sent at 10/20/2015  1:55 PM EDT ----- Patient has a +UCx.  They need to start Augmentin 875/125 mg one tablet twice daily for seven days.  She is having ESWL on the 1st.

## 2015-10-25 NOTE — Telephone Encounter (Signed)
LMOM for pt to call back for PFT and Hosp f/u appt.

## 2015-10-25 NOTE — Telephone Encounter (Signed)
LMOM notifying pt of change of arrival time for ESW[facility] scheduled 10/27/15 from 12:30 to 7:30.

## 2015-10-25 NOTE — Progress Notes (Addendum)
* [facility]    60 year old female with known lung cancer, presented with increasing left-sided chest pain, and left lung atelectasis   Assessment; -Left lung collapse/atelectasis, due to left mainstem endobronchial tumor, seen on bronchoscopy on 5/29. Biopsy results are pending. -Doubt pneumonia, as the patient does not appear to have respiratory failure. -COPD, with history of smoking. -Alcohol abuse   Plan: -No need for further antibiotics respiratory standpoint. -Continue smoking cessation, this was discussed today. -Continue Spiriva outpatient, add Advair or similar. -The patient would like to follow up locally for her lung cancer, oncology is artery. Following the patient, reviewed -My office, will be contacting the patient for further outpatient follow-up, and pulmonary function test.  The patient can be discharged from a respiratory standpoint , her respiratory status appears baseline, and will require further definitive therapy of her cancer in order to make any significant improvement in terms of her left lung atelectasis.  -Pulmonary service will sign off for now, please call if there are any further questions or concerns.  Date: 10/25/2015  MRN# [id number] AKEIRA LAHM Nov 23, 1955   THENA DEVORA is a 60 y.o. old female seen in follow up for chief complaint of  Chief Complaint  Patient presents with  . Shortness of Breath     HPI:   No new complaints today, patient continues to have some mild left-sided chest pain.  Allergies:  Aspirin and Nsaids  Review of Systems: Gen:  Denies  fever, sweats. HEENT: Denies blurred vision. Cvc:  No dizziness, chest pain or heaviness Resp:   Denies cough or sputum porduction. Gi: Denies swallowing difficulty, stomach pain. constipation, bowel incontinence Gu:  Denies bladder incontinence, burning urine Ext:   No Joint pain, stiffness. Skin: No skin rash, easy bruising. Endoc:  No polyuria,  polydipsia. Psych: No depression, insomnia. Other:  All other systems were reviewed and found to be negative other than what is mentioned in the HPI.   Physical Examination:   VS: BP 106/56 mmHg  Pulse 78  Temp(Src) 98.4 F (36.9 C) (Oral)  Resp 19  Ht '5\' 7"'$  (1.702 m)  Wt 118 lb 12.8 oz (53.887 kg)  BMI 18.60 kg/m2  SpO2 95%  General Appearance: No distress  Neuro:without focal findings,  speech normal,  HEENT: PERRLA, EOM intact. Pulmonary: normal breath sounds, decreased air entry in left lung. CardiovascularNormal S1,S2.  No m/r/g.   Abdomen: Benign, Soft, non-tender. Renal:  No costovertebral tenderness  GU:  Not performed at this time. Endoc: No evident thyromegaly, no signs of acromegaly. Skin:   warm, no rash. Extremities: normal, no cyanosis, clubbing.   LABORATORY PANEL:   CBC  Recent Labs Lab 10/25/15 0530  WBC 10.3  HGB 11.0*  HCT 33.1*  PLT 297   ------------------------------------------------------------------------------------------------------------------  Chemistries   Recent Labs Lab 10/22/15 2025  10/24/15 0502 10/25/15 0530  NA 136  < > 138 137  K 3.7  < > 3.9 3.6  CL 96*  < > 107 104  CO2 30  < > 28 28  GLUCOSE 126*  < > 104* 101*  BUN 20  < > 12 9  CREATININE 0.89  < > 0.58 0.66  CALCIUM 9.4  < > 8.1* 8.5*  MG  --   --  1.9  --   AST 39  --   --   --   ALT 34  --   --   --   ALKPHOS 100  --   --   --  BILITOT 0.5  --   --   --   < > = values in this interval not displayed. ------------------------------------------------------------------------------------------------------------------  Cardiac Enzymes  Recent Labs Lab 10/22/15 2025  TROPONINI <0.03   ------------------------------------------------------------  RADIOLOGY:   No results found for this or any previous visit. Results for orders placed during the hospital encounter of 10/22/15  DG Chest 2 View   Narrative CLINICAL DATA:  Chest pain  EXAM: CHEST  2  VIEW  COMPARISON:  10/12/2015  FINDINGS: Cardiac shadow is within normal limits. The right lung is hyperinflated and clear. Chronic changes are noted in the left hemi thorax similar to that noted on prior CT examination. This is consistent with the patient's given clinical history of lung carcinoma and treatment. Visualized upper abdomen is within normal limits.  IMPRESSION: Chronic changes in the left hemi thorax consistent with the given clinical history of carcinoma of the lung.  Hyperinflation in the right lung without focal abnormality.   Electronically Signed   By: Inez Catalina M.D.   On: 10/22/2015 21:00    ------------------------------------------------------------------------------------------------------------------  Thank  you for allowing [facility] to assist in the care of your patient. Our recommendations are noted above.  Please contact us if we can be of further service.   Marda Stalker, MD.  [facility] Office Number: [phone]  Patricia Pesa, M.D.  Vilinda Boehringer, M.D.  Merton Border, M.D  10/25/2015

## 2015-10-25 NOTE — Telephone Encounter (Signed)
LMOM- Not able to get in touch with pt.

## 2015-10-25 NOTE — Progress Notes (Signed)
Patient discharged home per MD orders. Prescription given to patient. All discharge instructions given and all questions answered. Escorted by family and aux.

## 2015-10-25 NOTE — Progress Notes (Signed)
Pharmacy Antibiotic Note  Evelyn Willis is a 60 y.o. female admitted on 10/22/2015 with pneumonia.  Pharmacy has been consulted for cefepime and vancomycin dosing.  Plan: Continue Vancomycin 750 mg IV q12 hour dosing. Trough level prior to the 06:00 dose on 5/30.   Continue Cefepime 1 g IV q8 hours.   Height: '5\' 7"'$  (170.2 cm) Weight: 118 lb 12.8 oz (53.887 kg) IBW/kg (Calculated) : 61.6  Temp (24hrs), Avg:98 F (36.7 C), Min:97 F (36.1 C), Max:98.6 F (37 C)   Recent Labs Lab 10/22/15 2025 10/22/15 2200 10/23/15 0517 10/24/15 0502 10/25/15 0530  WBC 26.9*  --  21.9* 11.5* 10.3  CREATININE 0.89  --  0.60 0.58 0.66  LATICACIDVEN  --  0.7  --   --   --   VANCOTROUGH  --   --   --   --  13    Estimated Creatinine Clearance: 62.8 mL/min (by C-G formula based on Cr of 0.66).    Allergies  Allergen Reactions  . Aspirin Other (See Comments)    Pt gets bleeding ulcers when she takes this medication Bleeding ulcers  . Nsaids Other (See Comments)    Other reaction(s): Other (See Comments) Cannot take due to significant peptic ulcer disease. Cannot take due to significant peptic ulcer disease.    Antimicrobials this admission:   >>    >>   Dose adjustments this admission: Cefepime 1 g IV q8 hours.   5/30 AM vanc level 13. Changed to 1 gram q 12 hours . Level before 3rd new dose.  Microbiology results:  BCx: pending  UCx:  Pending   Sputum:    MRSA PCR: negative  Thank you for allowing pharmacy to be a part of this patient's care.  Cheryle Dark S 10/25/2015 6:16 AM

## 2015-10-25 NOTE — Care Management Important Message (Signed)
Important Message  Patient Details  Name: Evelyn Willis MRN: [id number] Date of Birth: [birth year]   Medicare Important Message Given:  Yes    [name] 10/25/2015, 10:09 AM

## 2015-10-25 NOTE — Discharge Summary (Signed)
[facility] [facility] [place] NAME: Evelyn Willis    MR#:  [id number]  DATE OF BIRTH:  Mar 14, 1956  DATE OF ADMISSION:  10/22/2015 ADMITTING PHYSICIAN: Saundra Shelling, MD  DATE OF DISCHARGE: 10/25/2015 10:46 AM  PRIMARY CARE PHYSICIAN: BABAOFF, MARC E, MD    ADMISSION DIAGNOSIS:  Pleural effusion [J90] Chest pain, unspecified chest pain type [R07.9]  DISCHARGE DIAGNOSIS:  Principal Problem:   Collapse of left lung Active Problems:   Postobstructive pneumonia   Pneumonia   SECONDARY DIAGNOSIS:   Past Medical History  Diagnosis Date  . Depression   . Multiple sclerosis ([place])   . Coronary artery disease     said- stent was placed 10 yrs ago from now( 2017) in [place], [place]  . Hematuria   . Atrial fibrillation ([place])   . Cancer [facility]) 2016    Left lung cancer, radiation, chemo  . COPD (chronic obstructive pulmonary disease) ([place])   . Emphysema lung ([place])   . Renal calculi     HOSPITAL COURSE:   1. Clinical sepsis on presentation with leukocytosis tachycardia and postobstructive pneumonia. Patient was started on antibiotics vancomycin and Maxipime. Switched over to oral Levaquin for completion of course upon discharge home. 2. 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 Bronchoscopy showing complete obstruction of left mainstem bronchus. Biopsies taken by Dr. Ashby Dawes. Patient to follow-up with oncology Dr. Grayland Ormond as outpatient to review biopsy and set up treatment plan. 3. Atrial ablation rate controlled with amiodarone 4. Relative hypotension hold metoprolol 5 acute on chronic pain on long-acting morphine. I wrote a prescription for short acting Percocet. 6. Depression on Wellbutrin 7. Acute respiratory distress resolved. Patient initially required BiPAP to for some air in. Oxygen tapered completely to off.  DISCHARGE CONDITIONS:   Fair  CONSULTS OBTAINED:  Treatment Team:  Vilinda Boehringer, MD Lloyd Huger, MD  DRUG  ALLERGIES:   Allergies  Allergen Reactions  . Aspirin Other (See Comments)    Pt gets bleeding ulcers when she takes this medication Bleeding ulcers  . Nsaids Other (See Comments)    Other reaction(s): Other (See Comments) Cannot take due to significant peptic ulcer disease. Cannot take due to significant peptic ulcer disease.    DISCHARGE MEDICATIONS:   Discharge Medication List as of 10/25/2015 10:29 AM    START taking these medications   Details  levofloxacin (LEVAQUIN) 500 MG tablet Take 1 tablet (500 mg total) by mouth daily., Starting 10/25/2015, Until Discontinued, Print      CONTINUE these medications which have CHANGED   Details  HYDROcodone-acetaminophen (NORCO) 10-325 MG tablet Take 1 tablet by mouth every 4 (four) hours as needed., Starting 10/25/2015, Until Discontinued, Print      CONTINUE these medications which have NOT CHANGED   Details  amiodarone (PACERONE) 200 MG tablet Take 1 tablet (200 mg total) by mouth 2 (two) times daily., Starting 09/16/2015, Until Discontinued, Normal    amitriptyline (ELAVIL) 75 MG tablet Take 75 mg by mouth at bedtime., Starting 02/10/2014, Until Discontinued, Historical Med    buPROPion (WELLBUTRIN XL) 150 MG 24 hr tablet Take 1 tablet by mouth daily., Starting 07/19/2015, Until Discontinued, Historical Med    mirtazapine (REMERON) 15 MG tablet Take 15 mg by mouth at bedtime., Starting 08/18/2012, Until Discontinued, Historical Med    morphine (MS CONTIN) 15 MG 12 hr tablet Take 1 tablet by mouth 2 (two) times daily., Starting 08/04/2015, Until Discontinued, Historical Med    SPIRIVA HANDIHALER 18 MCG  inhalation capsule Place 1 capsule into inhaler and inhale daily., Starting 07/05/2015, Until Discontinued, Historical Med    albuterol (VENTOLIN HFA) 108 (90 Base) MCG/ACT inhaler Inhale 2 puffs into the lungs every 4 (four) hours as needed., Starting 07/06/2015, Until Discontinued, Historical Med    amphetamine-dextroamphetamine (ADDERALL)  20 MG tablet Take 20 mg by mouth daily. Reported on 09/23/2015, Starting 08/06/2015, Until Discontinued, Historical Med      STOP taking these medications     amoxicillin-clavulanate (AUGMENTIN) 875-125 MG tablet      metoprolol succinate (TOPROL XL) 25 MG 24 hr tablet          DISCHARGE INSTRUCTIONS:   Follow-up oncology Dr. Grayland Ormond one week  If you experience worsening of your admission symptoms, develop shortness of breath, life threatening emergency, suicidal or homicidal thoughts you must seek medical attention immediately by calling 911 or calling your MD immediately  if symptoms less severe.  You Must read complete instructions/literature along with all the possible adverse reactions/side effects for all the Medicines you take and that have been prescribed to you. Take any new Medicines after you have completely understood and accept all the possible adverse reactions/side effects.   Please note  You were cared for by a hospitalist during your hospital stay. If you have any questions about your discharge medications or the care you received while you were in the hospital after you are discharged, you can call the unit and asked to speak with the hospitalist on call if the hospitalist that took care of you is not available. Once you are discharged, your primary care physician will handle any further medical issues. Please note that NO REFILLS for any discharge medications will be authorized once you are discharged, as it is imperative that you return to your primary care physician (or establish a relationship with a primary care physician if you do not have one) for your aftercare needs so that they can reassess your need for medications and monitor your lab values.    Today   CHIEF COMPLAINT:   Chief Complaint  Patient presents with  . Shortness of Breath    HISTORY OF PRESENT ILLNESS:  Evelyn Willis  is a 60 y.o. female presented with shortness of breath found to have left  lung collapse.    VITAL SIGNS:  Blood pressure 106/56, pulse 78, temperature 98.4 F (36.9 C), temperature source Oral, resp. rate 19, height '5\' 7"'$  (1.702 m), weight 53.887 kg (118 lb 12.8 oz), SpO2 95 %.    PHYSICAL EXAMINATION:  GENERAL:  60 y.o.-year-old patient lying in the bed with no acute distress.  EYES: Pupils equal, round, reactive to light and accommodation. No scleral icterus. Extraocular muscles intact.  HEENT: Head atraumatic, normocephalic. Oropharynx and nasopharynx clear.  NECK:  Supple, no jugular venous distention. No thyroid enlargement, no tenderness.  LUNGS: Normal breath sounds Right lung, some air entry heard in left upper lung but absent breath sounds in left lower lung fields, no wheezing, rales,rhonchi or crepitation. No use of accessory muscles of respiration.  CARDIOVASCULAR: S1, S2 normal. No murmurs, rubs, or gallops.  ABDOMEN: Soft, non-tender, non-distended. Bowel sounds present. No organomegaly or mass.  EXTREMITIES: No pedal edema, cyanosis, or clubbing.  NEUROLOGIC: Cranial nerves II through XII are intact. Muscle strength 5/5 in all extremities. Sensation intact. Gait not checked.  PSYCHIATRIC: The patient is alert and oriented x 3.  SKIN: No obvious rash, lesion, or ulcer.   DATA REVIEW:   CBC  Recent Labs  Lab 10/25/15 0530  WBC 10.3  HGB 11.0*  HCT 33.1*  PLT 297    Chemistries   Recent Labs Lab 10/22/15 2025  10/24/15 0502 10/25/15 0530  NA 136  < > 138 137  K 3.7  < > 3.9 3.6  CL 96*  < > 107 104  CO2 30  < > 28 28  GLUCOSE 126*  < > 104* 101*  BUN 20  < > 12 9  CREATININE 0.89  < > 0.58 0.66  CALCIUM 9.4  < > 8.1* 8.5*  MG  --   --  1.9  --   AST 39  --   --   --   ALT 34  --   --   --   ALKPHOS 100  --   --   --   BILITOT 0.5  --   --   --   < > = values in this interval not displayed.  Cardiac Enzymes  Recent Labs Lab 10/22/15 2025  TROPONINI <0.03    Microbiology Results  Results for orders placed or  performed during the hospital encounter of 10/22/15  Blood culture (routine x 2)     Status: None (Preliminary result)   Collection Time: 10/22/15  9:24 PM  Result Value Ref Range Status   Specimen Description BLOOD RIGHT ASSIST CONTROL  Final   Special Requests BOTTLES DRAWN AEROBIC AND ANAEROBIC [place]  Final   Culture NO GROWTH 3 DAYS  Final   Report Status PENDING  Incomplete  Blood culture (routine x 2)     Status: None (Preliminary result)   Collection Time: 10/22/15  9:24 PM  Result Value Ref Range Status   Specimen Description BLOOD LEFT HAND  Final   Special Requests   Final    BOTTLES DRAWN AEROBIC AND ANAEROBIC [place]   Culture NO GROWTH 3 DAYS  Final   Report Status PENDING  Incomplete  MRSA PCR Screening     Status: None   Collection Time: 10/23/15  9:10 AM  Result Value Ref Range Status   MRSA by PCR NEGATIVE NEGATIVE Final    Comment:        The GeneXpert MRSA Assay (FDA approved for NASAL specimens only), is one component of a comprehensive MRSA colonization surveillance program. It is not intended to diagnose MRSA infection nor to guide or monitor treatment for MRSA infections.     RADIOLOGY:  Dg Chest Port 1 View  10/24/2015  CLINICAL DATA:  Left lung consolidation EXAM: PORTABLE CHEST 1 VIEW COMPARISON:  10/22/2015 FINDINGS: Right lung is hyperinflated. Some mediastinal shift to the left is noted with significant volume loss on the left. The minimally aerated the left lung now demonstrates complete consolidation. Pleural effusion is present as well based on recent CT. No bony abnormality is noted. IMPRESSION: Continued consolidation of the left lung with mediastinal shift from right to left. Abrupt cut off of the left mainstem bronchus is noted likely related to obstructing mass. Electronically Signed   By: Inez Catalina M.D.   On: 10/24/2015 07:47    Management plans discussed with the patient, and she is in agreement.  CODE STATUS:  Code  Status History    Date Active Date Inactive Code Status Order ID Comments User Context   10/23/2015  3:19 AM 10/25/2015  2:02 PM Full Code [id number]  Saundra Shelling, MD Inpatient   08/06/2015 11:33 AM 08/07/2015  8:44 PM Full Code [id number]  Vaughan Basta, MD Inpatient  TOTAL TIME TAKING CARE OF THIS PATIENT: 35 minutes.    Loletha Grayer M.D on 10/25/2015 at 5:22 PM  Between 7am to 6pm - Pager - [phone]  After 6pm go to [email or web address] - password [email or web address] [facility]  [employer] [facility]  [phone]  CC: Primary care physician; BABAOFF, Caryl Bis, MD, Dr. Delight Hoh

## 2015-10-25 NOTE — Telephone Encounter (Signed)
-----   Message from Laverle Hobby, MD sent at 10/25/2015  8:58 AM EDT ----- Regarding: hfu Pt needs fu with me in 2-4 weeks, needs PFT before appt.

## 2015-10-26 ENCOUNTER — Telehealth: Payer: Self-pay | Admitting: Radiology

## 2015-10-26 ENCOUNTER — Other Ambulatory Visit: Payer: Self-pay | Admitting: Oncology

## 2015-10-26 ENCOUNTER — Ambulatory Visit
Admission: RE | Admit: 2015-10-26 | Discharge: 2015-10-26 | Disposition: A | Discharge status: Home / Self Care | Payer: PPO | Source: Ambulatory Visit | Attending: Urology | Admitting: Urology

## 2015-10-26 ENCOUNTER — Other Ambulatory Visit: Payer: Self-pay | Admitting: Radiology

## 2015-10-26 DIAGNOSIS — N201 Calculus of ureter: Secondary | ICD-10-CM

## 2015-10-26 LAB — SURGICAL PATHOLOGY

## 2015-10-26 MED ORDER — ALPRAZOLAM 0.5 MG PO TABS: 0.5000 mg | ORAL_TABLET | Freq: Once | ORAL | Status: DC

## 2015-10-26 NOTE — Telephone Encounter (Signed)
Pt states she was discharged from the hospital on 10/25/15 after having a collapsed lung caused by cancer recurrence. Denies flank pain. KUB ordered by Dr Erlene Quan. Awaiting results.

## 2015-10-26 NOTE — Telephone Encounter (Signed)
[facility] notifying pt Dr Erlene Quan ordered KUB to be done today 10/26/15.

## 2015-10-26 NOTE — Telephone Encounter (Signed)
Notified pt that ESWL scheduled on 10/27/15 will be cancelled per Dr Erlene Quan d/t stone not seen on KUB. Pt voices understanding.

## 2015-10-27 ENCOUNTER — Encounter: Admission: RE | Payer: Self-pay | Source: Ambulatory Visit

## 2015-10-27 ENCOUNTER — Other Ambulatory Visit: Payer: Self-pay

## 2015-10-27 ENCOUNTER — Ambulatory Visit: Admission: RE | Admit: 2015-10-27 | Payer: PPO | Source: Ambulatory Visit | Admitting: Urology

## 2015-10-27 DIAGNOSIS — R0602 Shortness of breath: Secondary | ICD-10-CM

## 2015-10-27 SURGERY — LITHOTRIPSY, ESWL
Anesthesia: Moderate Sedation | Laterality: Right

## 2015-10-27 NOTE — Telephone Encounter (Signed)
Initially on KUB yesterday there was no obvious stone. There is a calcification which was more distal and very lateral which I felt may have been within the bowel. The radiologist reports that this may be consistent with a retained stone.  Call patient to discuss these findings.  I have recommended an aggressive bowel regimen today in order to facilitate a large BM. I would like to repeat the KUB tomorrow when she's had a large bowel movement to assess whether or not the calcification in question represents the stone.  If the stone is still present, we will rebook her for ESWL next Thursday.

## 2015-10-27 NOTE — Telephone Encounter (Signed)
Evelyn Willis has schedule the appts for the pt. Nothing further needed.

## 2015-10-31 ENCOUNTER — Ambulatory Visit
Admission: RE | Admit: 2015-10-31 | Discharge: 2015-10-31 | Disposition: A | Discharge status: Home / Self Care | Payer: PPO | Source: Ambulatory Visit | Attending: Urology | Admitting: Urology

## 2015-10-31 DIAGNOSIS — N200 Calculus of kidney: Secondary | ICD-10-CM | POA: Diagnosis present

## 2015-10-31 DIAGNOSIS — N201 Calculus of ureter: Secondary | ICD-10-CM | POA: Insufficient documentation

## 2015-11-01 ENCOUNTER — Inpatient Hospital Stay: Payer: PPO | Attending: Oncology | Admitting: Oncology

## 2015-11-01 VITALS — BP 100/66 | HR 84 | Temp 96.8°F | Resp 18 | Ht 66.73 in | Wt 114.2 lb

## 2015-11-01 DIAGNOSIS — Z9221 Personal history of antineoplastic chemotherapy: Secondary | ICD-10-CM

## 2015-11-01 DIAGNOSIS — Z7952 Long term (current) use of systemic steroids: Secondary | ICD-10-CM | POA: Insufficient documentation

## 2015-11-01 DIAGNOSIS — R0602 Shortness of breath: Secondary | ICD-10-CM

## 2015-11-01 DIAGNOSIS — Z87442 Personal history of urinary calculi: Secondary | ICD-10-CM

## 2015-11-01 DIAGNOSIS — Z923 Personal history of irradiation: Secondary | ICD-10-CM | POA: Diagnosis not present

## 2015-11-01 DIAGNOSIS — I2510 Atherosclerotic heart disease of native coronary artery without angina pectoris: Secondary | ICD-10-CM | POA: Insufficient documentation

## 2015-11-01 DIAGNOSIS — J189 Pneumonia, unspecified organism: Secondary | ICD-10-CM | POA: Diagnosis not present

## 2015-11-01 DIAGNOSIS — Z79899 Other long term (current) drug therapy: Secondary | ICD-10-CM | POA: Diagnosis not present

## 2015-11-01 DIAGNOSIS — N201 Calculus of ureter: Secondary | ICD-10-CM | POA: Diagnosis not present

## 2015-11-01 DIAGNOSIS — C3490 Malignant neoplasm of unspecified part of unspecified bronchus or lung: Secondary | ICD-10-CM

## 2015-11-01 DIAGNOSIS — R7989 Other specified abnormal findings of blood chemistry: Secondary | ICD-10-CM | POA: Diagnosis not present

## 2015-11-01 DIAGNOSIS — I4891 Unspecified atrial fibrillation: Secondary | ICD-10-CM

## 2015-11-01 DIAGNOSIS — Z87891 Personal history of nicotine dependence: Secondary | ICD-10-CM | POA: Insufficient documentation

## 2015-11-01 DIAGNOSIS — R531 Weakness: Secondary | ICD-10-CM | POA: Diagnosis not present

## 2015-11-01 DIAGNOSIS — R079 Chest pain, unspecified: Secondary | ICD-10-CM | POA: Diagnosis not present

## 2015-11-01 DIAGNOSIS — J90 Pleural effusion, not elsewhere classified: Secondary | ICD-10-CM | POA: Diagnosis not present

## 2015-11-01 DIAGNOSIS — R05 Cough: Secondary | ICD-10-CM

## 2015-11-01 DIAGNOSIS — R634 Abnormal weight loss: Secondary | ICD-10-CM | POA: Insufficient documentation

## 2015-11-01 DIAGNOSIS — I700 Atherosclerosis of aorta: Secondary | ICD-10-CM | POA: Insufficient documentation

## 2015-11-01 DIAGNOSIS — C779 Secondary and unspecified malignant neoplasm of lymph node, unspecified: Secondary | ICD-10-CM | POA: Insufficient documentation

## 2015-11-01 DIAGNOSIS — G35 Multiple sclerosis: Secondary | ICD-10-CM | POA: Insufficient documentation

## 2015-11-01 DIAGNOSIS — D649 Anemia, unspecified: Secondary | ICD-10-CM | POA: Diagnosis not present

## 2015-11-01 DIAGNOSIS — R5383 Other fatigue: Secondary | ICD-10-CM | POA: Diagnosis not present

## 2015-11-01 DIAGNOSIS — R630 Anorexia: Secondary | ICD-10-CM

## 2015-11-01 DIAGNOSIS — F329 Major depressive disorder, single episode, unspecified: Secondary | ICD-10-CM | POA: Diagnosis not present

## 2015-11-01 DIAGNOSIS — C3402 Malignant neoplasm of left main bronchus: Secondary | ICD-10-CM | POA: Insufficient documentation

## 2015-11-01 DIAGNOSIS — F419 Anxiety disorder, unspecified: Secondary | ICD-10-CM | POA: Insufficient documentation

## 2015-11-01 DIAGNOSIS — J9811 Atelectasis: Secondary | ICD-10-CM | POA: Diagnosis not present

## 2015-11-01 DIAGNOSIS — J449 Chronic obstructive pulmonary disease, unspecified: Secondary | ICD-10-CM

## 2015-11-01 DIAGNOSIS — J439 Emphysema, unspecified: Secondary | ICD-10-CM | POA: Diagnosis not present

## 2015-11-01 NOTE — Telephone Encounter (Signed)
Pt notified that stone was seen on KUB so ESWL is r/s for 11/03/15. Pt is to arrive at [facility] at 8:15 & be npo after mn. Pt voices understanding.

## 2015-11-01 NOTE — Progress Notes (Signed)
Patient presented to ER with trouble breathing and chest pain.  During hospital stay testing did show recurrent lung mass with left lung collapse.  She has previously received treatment in 2016 at [facility] (records available in [place] portion of the chart).  The chest/lung pain is being treated with Morphine and Hydrocodone that does help relieve the pain.  Is feeling very fatigued, has loss of appetite with weight loss, and dyspnea on exertion.

## 2015-11-02 ENCOUNTER — Encounter: Payer: Self-pay | Admitting: *Deleted

## 2015-11-03 ENCOUNTER — Encounter
Admission: RE | Disposition: A | Discharge status: Home / Self Care | Payer: Self-pay | Source: Ambulatory Visit | Attending: Urology

## 2015-11-03 ENCOUNTER — Encounter: Payer: Self-pay | Admitting: Urology

## 2015-11-03 ENCOUNTER — Ambulatory Visit
Admission: RE | Admit: 2015-11-03 | Discharge: 2015-11-03 | Disposition: A | Discharge status: Home / Self Care | Payer: PPO | Source: Ambulatory Visit | Attending: Urology | Admitting: Urology

## 2015-11-03 DIAGNOSIS — N201 Calculus of ureter: Secondary | ICD-10-CM | POA: Insufficient documentation

## 2015-11-03 DIAGNOSIS — Z955 Presence of coronary angioplasty implant and graft: Secondary | ICD-10-CM | POA: Insufficient documentation

## 2015-11-03 DIAGNOSIS — G35 Multiple sclerosis: Secondary | ICD-10-CM | POA: Diagnosis not present

## 2015-11-03 DIAGNOSIS — J449 Chronic obstructive pulmonary disease, unspecified: Secondary | ICD-10-CM | POA: Diagnosis not present

## 2015-11-03 DIAGNOSIS — Z85118 Personal history of other malignant neoplasm of bronchus and lung: Secondary | ICD-10-CM | POA: Diagnosis not present

## 2015-11-03 HISTORY — PX: EXTRACORPOREAL SHOCK WAVE LITHOTRIPSY: SHX1557

## 2015-11-03 LAB — CULTURE, BLOOD (ROUTINE X 2)
Culture: NO GROWTH
Culture: NO GROWTH

## 2015-11-03 SURGERY — LITHOTRIPSY, ESWL
Anesthesia: Moderate Sedation | Laterality: Right

## 2015-11-03 MED ORDER — MORPHINE SULFATE (PF) 10 MG/ML IV SOLN
INTRAVENOUS | Status: AC
  Administered 2015-11-03: 6 mg
  Filled 2015-11-03: qty 1

## 2015-11-03 MED ORDER — PHENYLEPHRINE HCL 0.25 % NA SOLN
1.0000 | Freq: Four times a day (QID) | NASAL | Status: DC | PRN
  Filled 2015-11-03: qty 15

## 2015-11-03 MED ORDER — DOCUSATE SODIUM 100 MG PO CAPS: 100.0000 mg | ORAL_CAPSULE | Freq: Two times a day (BID) | ORAL | Status: DC

## 2015-11-03 MED ORDER — HYDROCODONE-ACETAMINOPHEN 5-325 MG PO TABS: 1.0000 | ORAL_TABLET | Freq: Four times a day (QID) | ORAL | Status: DC | PRN

## 2015-11-03 MED ORDER — DEXTROSE-NACL 5-0.45 % IV SOLN
INTRAVENOUS | Status: DC
  Administered 2015-11-03: 09:00:00 via INTRAVENOUS

## 2015-11-03 MED ORDER — MORPHINE SULFATE (PF) 2 MG/ML IV SOLN: 6.0000 mg | Freq: Once | INTRAVENOUS | Status: DC

## 2015-11-03 MED ORDER — ONDANSETRON HCL 4 MG/2ML IJ SOLN
4.0000 mg | Freq: Once | INTRAMUSCULAR | Status: AC
  Administered 2015-11-03: 4 mg via INTRAVENOUS

## 2015-11-03 MED ORDER — ONDANSETRON HCL 4 MG/2ML IJ SOLN
INTRAMUSCULAR | Status: AC
  Administered 2015-11-03: 4 mg via INTRAVENOUS
  Filled 2015-11-03: qty 2

## 2015-11-03 MED ORDER — LIDOCAINE HCL 2 % EX GEL: 1.0000 "application " | Freq: Once | CUTANEOUS | Status: DC

## 2015-11-03 MED ORDER — BUTAMBEN-TETRACAINE-BENZOCAINE 2-2-14 % EX AERO
1.0000 | INHALATION_SPRAY | Freq: Once | CUTANEOUS | Status: DC
  Filled 2015-11-03: qty 20

## 2015-11-03 MED ORDER — DEXTROSE 5 % IV SOLN
1.0000 g | Freq: Once | INTRAVENOUS | Status: AC
  Administered 2015-11-03: 1 g via INTRAVENOUS
  Filled 2015-11-03: qty 10

## 2015-11-03 MED ORDER — TAMSULOSIN HCL 0.4 MG PO CAPS: 0.4000 mg | ORAL_CAPSULE | Freq: Every day | ORAL | Status: DC

## 2015-11-03 NOTE — Discharge Instructions (Signed)
See [facility] discharge instructions in chart.  

## 2015-11-04 ENCOUNTER — Telehealth: Payer: Self-pay | Admitting: *Deleted

## 2015-11-04 ENCOUNTER — Ambulatory Visit (INDEPENDENT_AMBULATORY_CARE_PROVIDER_SITE_OTHER): Payer: PPO | Admitting: *Deleted

## 2015-11-04 DIAGNOSIS — R0602 Shortness of breath: Secondary | ICD-10-CM | POA: Diagnosis not present

## 2015-11-04 LAB — PULMONARY FUNCTION TEST
DL/VA % pred: 81 %
DL/VA: 4.21 ml/min/mmHg/L
DLCO unc % pred: 51 %
DLCO unc: 14.62 ml/min/mmHg
FEF 25-75 Post: 0.99 L/sec
FEF 25-75 Pre: 0.78 L/sec
FEF2575-%CHANGE-POST: 25 %
FEF2575-%PRED-PRE: 31 %
FEF2575-%Pred-Post: 39 %
FEV1-%Change-Post: 6 %
FEV1-%PRED-POST: 55 %
FEV1-%PRED-PRE: 51 %
FEV1-POST: 1.55 L
FEV1-PRE: 1.45 L
FEV1FVC-%CHANGE-POST: 5 %
FEV1FVC-%Pred-Pre: 82 %
FEV6-%Change-Post: 0 %
FEV6-%Pred-Post: 64 %
FEV6-%Pred-Pre: 64 %
FEV6-POST: 2.25 L
FEV6-Pre: 2.25 L
FEV6FVC-%Change-Post: 0 %
FEV6FVC-%PRED-POST: 103 %
FEV6FVC-%PRED-PRE: 104 %
FVC-%CHANGE-POST: 0 %
FVC-%PRED-PRE: 61 %
FVC-%Pred-Post: 62 %
FVC-POST: 2.26 L
FVC-PRE: 2.25 L
POST FEV6/FVC RATIO: 99 %
PRE FEV1/FVC RATIO: 65 %
Post FEV1/FVC ratio: 68 %
Pre FEV6/FVC Ratio: 100 %
RV % PRED: 94 %
RV: 2.06 L
TLC % PRED: 72 %
TLC: 4 L

## 2015-11-04 NOTE — Progress Notes (Signed)
PFT performed today. 

## 2015-11-04 NOTE — Telephone Encounter (Signed)
Left voicemail introducing navigation and offering assistance as needed with coordination of care, education, or other needs. Contact information given.

## 2015-11-06 NOTE — Progress Notes (Signed)
[place]  Telephone:([phone]) [phone] [phone]) [phone]  ID: Evelyn Willis OB: 05/08/1956  MR#: [id number]  IHK#:[id number]  Patient Care Team: Derinda Late, MD as PCP - General (Family Medicine) Minna Merritts, MD as Consulting Physician (Cardiology)  CHIEF COMPLAINT:  Chief Complaint  Patient presents with  . New Evaluation  . Hospitalization Follow-up    INTERVAL HISTORY: Patient returns to clinic for hospital follow-up. 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 She was admitted to the hospital with increasing shortness of breath and found to have left lung collapse due to obstruction as well as post obstructive pneumonia. CT scan was concerning for recurrence of her disease. Biopsy confirmed either recurrent or persistent disease. She continues to have pain, but her current narcotic regimen is adequate. She has weakness and fatigue. She has a poor appetite with weight loss. She continues to have both dyspnea on exertion and shortness of breath. She has no neurologic complaints. She denies any fevers. She has no nausea, vomiting, constipation, or diarrhea. She has no urinary complaints. Patient otherwise feels well and offers no further specific complaints.   REVIEW OF SYSTEMS:   Review of Systems  Constitutional: Positive for weight loss and malaise/fatigue. Negative for fever.  Respiratory: Positive for cough and shortness of breath.   Cardiovascular: Positive for chest pain.  Gastrointestinal: Negative.  Negative for abdominal pain, blood in stool and melena.  Musculoskeletal: Negative.   Neurological: Positive for weakness.  Psychiatric/Behavioral: The patient is nervous/anxious.     As per HPI. Otherwise, a complete review of systems is negatve.  PAST MEDICAL HISTORY: Past Medical History  Diagnosis Date   . Depression   . Multiple sclerosis ([place])   . Coronary artery disease     said- stent was placed 10 yrs ago from now( 2017) in [place], [place]  . Hematuria   . Atrial fibrillation ([place])   . COPD (chronic obstructive pulmonary disease) ([place])   . Emphysema lung ([place])   . Renal calculi   . Cancer [facility]) 2016    Left lung cancer, radiation, chemo    PAST SURGICAL HISTORY: Past Surgical History  Procedure Laterality Date  . Stomach ulcer removal  2011  . Total hip arthroplasty  2011  . Extracorporeal shock wave lithotripsy Right 11/03/2015    Procedure: EXTRACORPOREAL SHOCK WAVE LITHOTRIPSY (ESWL);  Surgeon: Hollice Espy, MD;  Location: [facility] ORS;  Service: Urology;  Laterality: Right;    FAMILY HISTORY Family History  Problem Relation Age of Onset  . CAD Mother   . Atrial fibrillation Mother   . Heart attack Neg Hx   . Hypertension Neg Hx   . Stroke Neg Hx   . Kidney disease Neg Hx        ADVANCED DIRECTIVES:    HEALTH MAINTENANCE: Social History  Substance Use Topics  . Smoking status: Former Smoker -- 1.50 packs/day for 45 years    Quit date: 07/26/2015  . Smokeless tobacco: Not on file     Comment: x 2 weeks  . Alcohol Use: 0.0 oz/week    0 Standard drinks or equivalent per week     Colonoscopy:  PAP:  Bone density:  Lipid panel:  Allergies  Allergen Reactions  . Aspirin Other (See Comments)    Pt gets bleeding ulcers when she takes this medication Bleeding ulcers  . Nsaids  Other (See Comments)    Other reaction(s): Other (See Comments) Cannot take due to significant peptic ulcer disease. Cannot take due to significant peptic ulcer disease.    Current Outpatient Prescriptions  Medication Sig Dispense Refill  . albuterol (VENTOLIN HFA) 108 (90 Base) MCG/ACT inhaler Inhale 2 puffs into the lungs every 4 (four) hours as needed.    Marland Kitchen amiodarone (PACERONE) 200 MG tablet Take 1 tablet (200 mg total) by mouth 2 (two) times daily. 60 tablet 3  . amitriptyline  (ELAVIL) 75 MG tablet Take 75 mg by mouth at bedtime.    Marland Kitchen amphetamine-dextroamphetamine (ADDERALL) 20 MG tablet Take 20 mg by mouth daily. Reported on 09/23/2015    . buPROPion (WELLBUTRIN XL) 150 MG 24 hr tablet Take 1 tablet by mouth daily.    Marland Kitchen HYDROcodone-acetaminophen (NORCO) 10-325 MG tablet Take 1 tablet by mouth every 4 (four) hours as needed. 50 tablet 0  . mirtazapine (REMERON) 15 MG tablet Take 15 mg by mouth at bedtime.    Marland Kitchen morphine (MS CONTIN) 15 MG 12 hr tablet Take 1 tablet by mouth 2 (two) times daily.    Marland Kitchen SPIRIVA HANDIHALER 18 MCG inhalation capsule Place 1 capsule into inhaler and inhale daily.    Marland Kitchen ALPRAZolam (XANAX) 1 MG tablet Take 1 mg by mouth at bedtime as needed for anxiety.    . docusate sodium (COLACE) 100 MG capsule Take 1 capsule (100 mg total) by mouth 2 (two) times daily. 60 capsule 0  . doxycycline (ADOXA) 100 MG tablet Take 100 mg by mouth 2 (two) times daily.    Marland Kitchen HYDROcodone-acetaminophen (NORCO/VICODIN) 5-325 MG tablet Take 1-2 tablets by mouth every 6 (six) hours as needed for moderate pain. 15 tablet 0  . metoprolol succinate (TOPROL-XL) 25 MG 24 hr tablet Take 25 mg by mouth daily.    . nicotine (NICODERM CQ - DOSED IN MG/24 HOURS) 21 mg/24hr patch Place 21 mg onto the skin daily.    . predniSONE (DELTASONE) 20 MG tablet Take 20 mg by mouth daily with breakfast. Reported on 11/03/2015    . tamsulosin (FLOMAX) 0.4 MG CAPS capsule Take 1 capsule (0.4 mg total) by mouth daily. 30 capsule 0   No current facility-administered medications for this visit.    OBJECTIVE: Filed Vitals:   11/01/15 1000  BP: 100/66  Pulse: 84  Temp: 96.8 F (36 C)  Resp: 18     Body mass index is 18.03 kg/(m^2).    ECOG FS:1 - Symptomatic but completely ambulatory  General: Well-developed, well-nourished, no acute distress. Eyes: Pink conjunctiva, anicteric sclera. HEENT: Normocephalic, moist mucous membranes, clear oropharnyx. Lungs: Diminished left breast sounds. Scattered  wheezing throughout. Heart: Regular rate and rhythm. No rubs, murmurs, or gallops. Abdomen: Soft, nontender, nondistended. No organomegaly noted, normoactive bowel sounds. Musculoskeletal: No edema, cyanosis, or clubbing. Neuro: Alert, answering all questions appropriately. Cranial nerves grossly intact. Skin: No rashes or petechiae noted. Psych: Normal affect. Lymphatics: No cervical, calvicular, axillary or inguinal LAD.   LAB RESULTS:  Lab Results  Component Value Date   NA 137 10/25/2015   K 3.6 10/25/2015   CL 104 10/25/2015   CO2 28 10/25/2015   GLUCOSE 101* 10/25/2015   BUN 9 10/25/2015   CREATININE 0.66 10/25/2015   CALCIUM 8.5* 10/25/2015   PROT 7.4 10/22/2015   ALBUMIN 3.1* 10/22/2015   AST 39 10/22/2015   ALT 34 10/22/2015   ALKPHOS 100 10/22/2015   BILITOT 0.5 10/22/2015   GFRNONAA >60 10/25/2015  GFRAA >60 10/25/2015    Lab Results  Component Value Date   WBC 10.3 10/25/2015   NEUTROABS 4.7 09/10/2009   HGB 11.0* 10/25/2015   HCT 33.1* 10/25/2015   MCV 92.6 10/25/2015   PLT 297 10/25/2015     STUDIES: Ct Abdomen Pelvis W Wo Contrast  10/12/2015  CLINICAL DATA:  Gross hematuria. Intermittent hematuria since March 2017. Taking beta blocker. History of gastric ulceration. Additional history of lung cancer with radiation chemotherapy EXAM: CT ABDOMEN AND PELVIS WITHOUT AND WITH CONTRAST TECHNIQUE: Multidetector CT imaging of the abdomen and pelvis was performed following the standard protocol before and following the bolus administration of intravenous contrast. CONTRAST:  163m ISOVUE-300 IOPAMIDOL (ISOVUE-300) INJECTION 61% COMPARISON:  CT 08/09/2009 FINDINGS: Lower chest: There is a LEFT pleural effusion and dense LEFT basilar atelectasis. RIGHT lung base is clear. Hepatobiliary: There is chronic intra and extrahepatic biliary duct dilatation. Postcholecystectomy. The common bile duct measures 10 mm through the pancreatic head. There is mild dilatation of the  pancreatic duct to 4 mm. These findings are similar to the slight progressed from CT of 08/10/1998 11. No obstructing lesions identified. No pancreatic atrophy or inflammation. Pancreas: Again the pancreatic duct is dilated to 4 mm increased from 2 mm on comparison exam were the duct was prominent. The pancreatic head lesion identified. No atrophy. Spleen: Normal spleen Adrenals/urinary tract: Adrenal glands are normal. There is hydronephrosis and hydroureter of the RIGHT renal collecting system. This is secondary to an obstructing calculus in the distal RIGHT ureter measuring 4 mm (image 61, series 4) this distal RIGHT ureteral calculus is faintly evident on the CT topogram. This calculus is approximately 6 cm from the vesicoureteral junction at the level of the inferior aspect the SI joints. There are 4 additional nonobstructing calculi in the RIGHT kidney. One 3 mm nonobstructing calculi in the LEFT kidney. No LEFT ureterolithiasis or obstructive uropathy. There is no enhancing renal lesion. No bladder calculi. There is significant streak artifact from the prosthetic on the LEFT no Stomach/Bowel: Stomach, small bowel, appendix, and cecum are normal. The colon and rectosigmoid colon are normal. Vascular/Lymphatic: Abdominal aorta is normal caliber. There is no retroperitoneal or periportal lymphadenopathy. No pelvic lymphadenopathy. Reproductive: Uterus poorly evaluated. Other: No free fluid. Musculoskeletal: No aggressive osseous lesion. IMPRESSION: 1. Obstructing calculus within the distal RIGHT ureter approximately 6 cm from the vesicoureteral junction. 2. Bilateral nephrolithiasis. 3. Chronic dilatation of the intra and extrahepatic bile ducts to the level of the ampulla is likely benign dilatation. Recommend correlation with bilirubin levels. 4. Dense LEFT basilar atelectasis and pleural effusion. These results will be called to the ordering clinician or representative by the Radiologist Assistant, and  communication documented in the PACS or zVision Dashboard. Electronically Signed   By: SSuzy BouchardM.D.   On: 10/12/2015 15:51   Dg Chest 2 View  10/22/2015  CLINICAL DATA:  Chest pain EXAM: CHEST  2 VIEW COMPARISON:  10/12/2015 FINDINGS: Cardiac shadow is within normal limits. The right lung is hyperinflated and clear. Chronic changes are noted in the left hemi thorax similar to that noted on prior CT examination. This is consistent with the patient's given clinical history of lung carcinoma and treatment. Visualized upper abdomen is within normal limits. IMPRESSION: Chronic changes in the left hemi thorax consistent with the given clinical history of carcinoma of the lung. Hyperinflation in the right lung without focal abnormality. Electronically Signed   By: MInez CatalinaM.D.   On: 10/22/2015 21:00  Dg Abd 1 View  10/31/2015  CLINICAL DATA:  Right ureteral calculus. EXAM: ABDOMEN - 1 VIEW COMPARISON:  10/26/2015 and 10/17/2015. CT of the abdomen and pelvis on 10/12/2015. FINDINGS: Similar calcification in the right lateral pelvis measuring 5 mm which appears to most likely represent the distal ureteral calculus seen previously. This is in a similar position to the prior radiograph on 10/26/2015. No additional abnormal calcifications are seen with stable phleboliths in the pelvis. Moderate fecal material is present throughout the colon without evidence of overt bowel obstruction. Clips present related to prior cholecystectomy. IMPRESSION: No significant change in appearance of a focal calcification in the right lateral pelvis measuring approximately 5 mm and most likely representing the previously identified distal right ureteral calculus. Electronically Signed   By: Aletta Edouard M.D.   On: 10/31/2015 15:25   Abdomen 1 View (kub)  10/27/2015  CLINICAL DATA:  Kidney stone. EXAM: ABDOMEN - 1 VIEW COMPARISON:  10/17/2015.  CT 10/12/2015. FINDINGS: Previously identified 6 mm calculus projected over the  right lower has slightly progressed more distally. Vascular calcifications again noted throughout the pelvis. Large amount stool again noted throughout the colon. Surgical clips right upper quadrant. Left hip replacement. IMPRESSION: 1. 6 mm distal right ureteral stone has progressed slightly more distally. 2. Large amount of stool in colon.  Constipation cannot be excluded. Electronically Signed   By: Marcello Moores  Register   On: 10/27/2015 08:36   Dg Abd 1 View  10/17/2015  CLINICAL DATA:  60 year old female -evaluate right ureteral calculus. EXAM: ABDOMEN - 1 VIEW COMPARISON:  10/12/2015 CT FINDINGS: A 6 mm calcification overlying the mid -distal right ureter is again identified compatible with known right ureteral calculus. There are adjacent vascular calcifications noted. The bowel gas pattern is unremarkable. Cholecystectomy clips and right hip arthroplasty noted. IMPRESSION: 6 mm mid -distal right ureteral calculus again identified. Adjacent vascular calcifications noted. Electronically Signed   By: Margarette Canada M.D.   On: 10/17/2015 16:49   Ct Angio Chest Pe W/cm &/or Wo Cm  10/22/2015  CLINICAL DATA:  Pleuritic chest pain.  Elevated D-dimer. EXAM: CT ANGIOGRAPHY CHEST WITH CONTRAST TECHNIQUE: Multidetector CT imaging of the chest was performed using the standard protocol during bolus administration of intravenous contrast. Multiplanar CT image reconstructions and MIPs were obtained to evaluate the vascular anatomy. CONTRAST:  75 cc Isovue 370 IV COMPARISON:  Chest radiograph earlier this date, as well as 08/06/2015 FINDINGS: There are no filling defects within the pulmonary arteries to suggest pulmonary embolus. Left lung branches limited secondary to atelectatic changes, no clinically significant filling defect. Near complete collapse of the left lung with only minimal aerated upper lobe portion. There is complete occlusion of the left mainstem bronchus image 67 series 7. Left lung collapse limits detailed  assessment, there is a probable hypodense lesion in the left hilum extending into the bronchus as cause of bronchial obstruction. Borders are difficult to define, however measures approximately 2.7 x 1.8 x 4.0 cm. Circumferential small left pleural effusion. Right lung hyperinflation, likely compensatory. Mild emphysema in the right lung. Minimal faint tree in bud opacities at the right lung base. Right bronchi are patent. There is no right pleural effusion. Moderate atherosclerosis of thoracic aorta. There is leftward mediastinal shift secondary to left lung volume loss. Coronary artery calcifications. Small volume pericardial fluid, difficult to differentiate from pleural fluid. Small prevascular lymph nodes for example 7 mm short axis image 47 series 5. Enlarged left supraclavicular node measures 1.5 by 1.6 cm  image 27 series 5. Additional smaller supraclavicular nodes on the left. No right hilar adenopathy. Patulous esophagus with fluid level. No definite acute abnormality in the included upper abdomen. Adrenal glands partially included without nodule. Mild superior endplate compression deformity tentatively identified T11. No blastic or destructive lytic lesions. Review of the MIP images confirms the above findings. IMPRESSION: 1. No pulmonary embolus. 2. Near complete collapse of the left lung, with probable obstructing mass at the left hilum. There is complete left mainstem bronchial obstruction. Recommend bronchoscopy. Small left pleural effusion. 3. Left supraclavicular adenopathy, likely metastatic. 4. Hyperinflated right lung with emphysema. Tree in bud opacities in the right lower lobe may be infectious, inflammatory, or secondary to aspiration. Electronically Signed   By: Jeb Levering M.D.   On: 10/22/2015 22:39   Dg Chest Port 1 View  10/24/2015  CLINICAL DATA:  Left lung consolidation EXAM: PORTABLE CHEST 1 VIEW COMPARISON:  10/22/2015 FINDINGS: Right lung is hyperinflated. Some mediastinal  shift to the left is noted with significant volume loss on the left. The minimally aerated the left lung now demonstrates complete consolidation. Pleural effusion is present as well based on recent CT. No bony abnormality is noted. IMPRESSION: Continued consolidation of the left lung with mediastinal shift from right to left. Abrupt cut off of the left mainstem bronchus is noted likely related to obstructing mass. Electronically Signed   By: Inez Catalina M.D.   On: 10/24/2015 07:47    ASSESSMENT: History of squamous cell carcinoma of the lung (stage IIa, T1b,N1,M0), now with either recurrent or persistent disease.  PLAN:    1. Lung cancer: Patient was initially diagnosed with squamous cell carcinoma the lung in June 2016. PET scan at that time revealed a 2.9 cm left hilar mass with no evidence of lymphadenopathy or distant metastasis. She was stage IIa disease based on one level one lymph node that returned positive for squamous cell on EBUS. She proceeded with concurrent chemotherapy and XRT at [facility]. She initially was started on carboplatinum and Taxol. Her XRT started on December 09, 2014 and she completed 6600 cGy in 33 fractions. She had a Taxol infusion reaction on cycle 4 and was switched to Abraxane '25mg'$ /m2 for cycle 5. Patient completed 7 weekly cycles of chemotherapy in approximately November 2016. By report, post-treatment scan revealed "stable disease". CT scan results reviewed independently and reported as above. Biopsy confirmed recurrent/persistent squamous cell carcinoma. We will get PET scan to assess for any further metastatic disease. Because patient recently completed chemotherapy and XRT, we will likely switch treatments to immunotherapy. Return to clinic one to 2 days after PET scan to discuss the results and treatment planning. 2. Multiple sclerosis: Managed at [facility]. By report patient no longer sees her neurologist but continues at pain clinic at [facility]. 3. Postoperative  pneumonia: Continue current antibiotics.  4. Anemia: Mild, monitor.  Patient expressed understanding and was in agreement with this plan. She also understands that She can call clinic at any time with any questions, concerns, or complaints.   Lloyd Huger, MD   11/06/2015 10:04 AM

## 2015-11-07 ENCOUNTER — Telehealth: Payer: Self-pay

## 2015-11-07 ENCOUNTER — Ambulatory Visit
Admission: RE | Admit: 2015-11-07 | Discharge: 2015-11-07 | Disposition: A | Discharge status: Home / Self Care | Payer: PPO | Source: Ambulatory Visit | Attending: Oncology | Admitting: Oncology

## 2015-11-07 ENCOUNTER — Ambulatory Visit
Admission: RE | Admit: 2015-11-07 | Discharge: 2015-11-07 | Disposition: A | Discharge status: Home / Self Care | Payer: PPO | Source: Ambulatory Visit | Attending: Urology | Admitting: Urology

## 2015-11-07 DIAGNOSIS — I2510 Atherosclerotic heart disease of native coronary artery without angina pectoris: Secondary | ICD-10-CM | POA: Diagnosis not present

## 2015-11-07 DIAGNOSIS — I7090 Unspecified atherosclerosis: Secondary | ICD-10-CM | POA: Diagnosis not present

## 2015-11-07 DIAGNOSIS — N200 Calculus of kidney: Secondary | ICD-10-CM | POA: Insufficient documentation

## 2015-11-07 DIAGNOSIS — N201 Calculus of ureter: Secondary | ICD-10-CM | POA: Insufficient documentation

## 2015-11-07 DIAGNOSIS — C3490 Malignant neoplasm of unspecified part of unspecified bronchus or lung: Secondary | ICD-10-CM | POA: Insufficient documentation

## 2015-11-07 DIAGNOSIS — J90 Pleural effusion, not elsewhere classified: Secondary | ICD-10-CM | POA: Insufficient documentation

## 2015-11-07 DIAGNOSIS — J9819 Other pulmonary collapse: Secondary | ICD-10-CM | POA: Insufficient documentation

## 2015-11-07 LAB — GLUCOSE, CAPILLARY: Glucose-Capillary: 109 mg/dL — ABNORMAL HIGH (ref 65–99)

## 2015-11-07 MED ORDER — FLUDEOXYGLUCOSE F - 18 (FDG) INJECTION
12.2500 | Freq: Once | INTRAVENOUS | Status: AC | PRN
  Administered 2015-11-07: 12.25 via INTRAVENOUS

## 2015-11-07 NOTE — Telephone Encounter (Signed)
-----   Message from Hollice Espy, MD sent at 11/07/2015  2:15 PM EDT ----- Unclear why patient had KUB today.  This was supposed to be on the day of her f/u and will need to be repeated to give it a little more time.  Please let her know.    Hollice Espy, MD

## 2015-11-07 NOTE — Telephone Encounter (Signed)
LMOM

## 2015-11-08 MED ORDER — ONDANSETRON HCL 4 MG PO TABS: 4.0000 mg | ORAL_TABLET | Freq: Three times a day (TID) | ORAL | Status: DC | PRN

## 2015-11-08 NOTE — Telephone Encounter (Signed)
Spoke with pt in reference to KUB. Made aware was not supposed to have KUB until f/u appt. Pt voiced understanding. New orders placed. Pt requested medication for nausea. Pt stated she is so nauseous shes not able to eat. Please advise.

## 2015-11-08 NOTE — Telephone Encounter (Signed)
Spoke with pt in reference to nausea medication. Pt stated that she is not currently having flank/lower abd pain. Reinforced with pt the nausea is more likely due to other health issues and will need to see physician for further refills. Pt voiced understanding.

## 2015-11-08 NOTE — Telephone Encounter (Signed)
She can have Zofran 4 mg q8 hours x 10, order sent to pharmacy. Is she having flank/ lower abd pain?  If not, her nausea is more likely related to her other health issues.    Hollice Espy, MD

## 2015-11-10 ENCOUNTER — Inpatient Hospital Stay (HOSPITAL_BASED_OUTPATIENT_CLINIC_OR_DEPARTMENT_OTHER): Payer: PPO | Admitting: Oncology

## 2015-11-10 VITALS — BP 102/67 | HR 82 | Temp 95.5°F | Resp 18 | Wt 113.5 lb

## 2015-11-10 DIAGNOSIS — Z79899 Other long term (current) drug therapy: Secondary | ICD-10-CM

## 2015-11-10 DIAGNOSIS — J449 Chronic obstructive pulmonary disease, unspecified: Secondary | ICD-10-CM

## 2015-11-10 DIAGNOSIS — Z87891 Personal history of nicotine dependence: Secondary | ICD-10-CM

## 2015-11-10 DIAGNOSIS — J189 Pneumonia, unspecified organism: Secondary | ICD-10-CM | POA: Diagnosis not present

## 2015-11-10 DIAGNOSIS — Z9221 Personal history of antineoplastic chemotherapy: Secondary | ICD-10-CM

## 2015-11-10 DIAGNOSIS — F329 Major depressive disorder, single episode, unspecified: Secondary | ICD-10-CM

## 2015-11-10 DIAGNOSIS — R630 Anorexia: Secondary | ICD-10-CM

## 2015-11-10 DIAGNOSIS — J439 Emphysema, unspecified: Secondary | ICD-10-CM

## 2015-11-10 DIAGNOSIS — R531 Weakness: Secondary | ICD-10-CM

## 2015-11-10 DIAGNOSIS — R7989 Other specified abnormal findings of blood chemistry: Secondary | ICD-10-CM

## 2015-11-10 DIAGNOSIS — R0602 Shortness of breath: Secondary | ICD-10-CM

## 2015-11-10 DIAGNOSIS — C3402 Malignant neoplasm of left main bronchus: Secondary | ICD-10-CM | POA: Diagnosis not present

## 2015-11-10 DIAGNOSIS — R5383 Other fatigue: Secondary | ICD-10-CM

## 2015-11-10 DIAGNOSIS — D649 Anemia, unspecified: Secondary | ICD-10-CM

## 2015-11-10 DIAGNOSIS — Z923 Personal history of irradiation: Secondary | ICD-10-CM

## 2015-11-10 DIAGNOSIS — C3492 Malignant neoplasm of unspecified part of left bronchus or lung: Secondary | ICD-10-CM

## 2015-11-10 DIAGNOSIS — C779 Secondary and unspecified malignant neoplasm of lymph node, unspecified: Secondary | ICD-10-CM | POA: Diagnosis not present

## 2015-11-10 DIAGNOSIS — R05 Cough: Secondary | ICD-10-CM

## 2015-11-10 DIAGNOSIS — F419 Anxiety disorder, unspecified: Secondary | ICD-10-CM

## 2015-11-10 DIAGNOSIS — G35 Multiple sclerosis: Secondary | ICD-10-CM

## 2015-11-10 DIAGNOSIS — I2510 Atherosclerotic heart disease of native coronary artery without angina pectoris: Secondary | ICD-10-CM

## 2015-11-10 DIAGNOSIS — J90 Pleural effusion, not elsewhere classified: Secondary | ICD-10-CM

## 2015-11-10 DIAGNOSIS — J9811 Atelectasis: Secondary | ICD-10-CM

## 2015-11-10 DIAGNOSIS — Z7952 Long term (current) use of systemic steroids: Secondary | ICD-10-CM

## 2015-11-10 DIAGNOSIS — I700 Atherosclerosis of aorta: Secondary | ICD-10-CM

## 2015-11-10 DIAGNOSIS — R634 Abnormal weight loss: Secondary | ICD-10-CM

## 2015-11-10 DIAGNOSIS — I4891 Unspecified atrial fibrillation: Secondary | ICD-10-CM

## 2015-11-10 DIAGNOSIS — N201 Calculus of ureter: Secondary | ICD-10-CM

## 2015-11-10 DIAGNOSIS — Z87442 Personal history of urinary calculi: Secondary | ICD-10-CM

## 2015-11-10 DIAGNOSIS — R079 Chest pain, unspecified: Secondary | ICD-10-CM

## 2015-11-10 NOTE — Progress Notes (Signed)
States is having pain in both lungs that causes shortness of breath. Extreme fatigue. Constipation. Intermittent nausea/vomiting.

## 2015-11-11 NOTE — Patient Instructions (Signed)
Nivolumab injection What is this medicine? NIVOLUMAB (nye VOL ue mab) is a monoclonal antibody. It is used to treat melanoma, lung cancer, kidney cancer, and Hodgkin lymphoma. This medicine may be used for other purposes; ask your health care provider or pharmacist if you have questions. What should I tell my health care provider before I take this medicine? They need to know if you have any of these conditions: -diabetes -immune system problems -kidney disease -liver disease -lung disease -organ transplant -stomach or intestine problems -thyroid disease -an unusual or allergic reaction to nivolumab, other medicines, foods, dyes, or preservatives -pregnant or trying to get pregnant -breast-feeding How should I use this medicine? This medicine is for infusion into a vein. It is given by a health care professional in a hospital or clinic setting. A special MedGuide will be given to you before each treatment. Be sure to read this information carefully each time. Talk to your pediatrician regarding the use of this medicine in children. Special care may be needed. Overdosage: If you think you have taken too much of this medicine contact a poison control center or emergency room at once. NOTE: This medicine is only for you. Do not share this medicine with others. What if I miss a dose? It is important not to miss your dose. Call your doctor or health care professional if you are unable to keep an appointment. What may interact with this medicine? Interactions have not been studied. Give your health care provider a list of all the medicines, herbs, non-prescription drugs, or dietary supplements you use. Also tell them if you smoke, drink alcohol, or use illegal drugs. Some items may interact with your medicine. This list may not describe all possible interactions. Give your health care provider a list of all the medicines, herbs, non-prescription drugs, or dietary supplements you use. Also tell  them if you smoke, drink alcohol, or use illegal drugs. Some items may interact with your medicine. What should I watch for while using this medicine? This drug may make you feel generally unwell. Continue your course of treatment even though you feel ill unless your doctor tells you to stop. You may need blood work done while you are taking this medicine. Do not become pregnant while taking this medicine or for 5 months after stopping it. Women should inform their doctor if they wish to become pregnant or think they might be pregnant. There is a potential for serious side effects to an unborn child. Talk to your health care professional or pharmacist for more information. Do not breast-feed an infant while taking this medicine. What side effects may I notice from receiving this medicine? Side effects that you should report to your doctor or health care professional as soon as possible: -allergic reactions like skin rash, itching or hives, swelling of the face, lips, or tongue -black, tarry stools -blood in the urine -bloody or watery diarrhea -changes in vision -change in sex drive -changes in emotions or moods -chest pain -confusion -cough -decreased appetite -diarrhea -facial flushing -feeling faint or lightheaded -fever, chills -hair loss -hallucination, loss of contact with reality -headache -irritable -joint pain -loss of memory -muscle pain -muscle weakness -seizures -shortness of breath -signs and symptoms of high blood sugar such as dizziness; dry mouth; dry skin; fruity breath; nausea; stomach pain; increased hunger or thirst; increased urination -signs and symptoms of kidney injury like trouble passing urine or change in the amount of urine -signs and symptoms of liver injury like dark yellow or  brown urine; general ill feeling or flu-like symptoms; light-colored stools; loss of appetite; nausea; right upper belly pain; unusually weak or tired; yellowing of the eyes or  skin -stiff neck -swelling of the ankles, feet, hands -weight gain Side effects that usually do not require medical attention (report to your doctor or health care professional if they continue or are bothersome): -bone pain -constipation -tiredness -vomiting This list may not describe all possible side effects. Call your doctor for medical advice about side effects. You may report side effects to FDA at 1-800-FDA-1088. Where should I keep my medicine? This drug is given in a hospital or clinic and will not be stored at home. NOTE: This sheet is a summary. It may not cover all possible information. If you have questions about this medicine, talk to your doctor, pharmacist, or health care provider.    2016, Elsevier/Gold Standard. (2014-10-13 10:03:42)

## 2015-11-14 ENCOUNTER — Ambulatory Visit (INDEPENDENT_AMBULATORY_CARE_PROVIDER_SITE_OTHER): Payer: PPO | Admitting: Internal Medicine

## 2015-11-14 ENCOUNTER — Encounter: Payer: Self-pay | Admitting: Internal Medicine

## 2015-11-14 VITALS — BP 112/62 | HR 94 | Ht 66.0 in | Wt 111.0 lb

## 2015-11-14 DIAGNOSIS — J438 Other emphysema: Secondary | ICD-10-CM

## 2015-11-14 MED ORDER — NYSTATIN 100000 UNIT/ML MT SUSP: 5.0000 mL | Freq: Four times a day (QID) | OROMUCOSAL | Status: DC

## 2015-11-14 NOTE — Addendum Note (Signed)
Addended by: Oscar La R on: 11/14/2015 11:34 AM   Modules accepted: Orders

## 2015-11-14 NOTE — Patient Instructions (Addendum)
--  Not smoking is the best thing you can do for your lungs.    --Start nystatin swish and spit for 10 days.

## 2015-11-14 NOTE — Progress Notes (Signed)
* [facility]     Assessment and Plan:  Lung cancer. -Left lung endobronchial squamous cell cancer. -Patient is following up with oncology, treatment is being planned to initiate in the next few weeks.  Emphysema/COPD. -Review of PFT from 11/04/15, FEV1 51%, air trapping, DLCO equals 51%, consistent with moderate COPD, on the cusp of becoming severe. -Continue Spiriva once daily, once she is over her thrush we can consider adding Advair.  Oral thrush. -We'll start nystatin swish and spit, we'll hold off on starting Advair at this time due to the presence of thrush.  Left lung atelectasis. -Left lung atelectasis due to left endobronchial cancer. -Doubt any evidence of postobstructive pneumonia. At this time, continue with primary therapy for lung cancer.  Nicotine abuse. -She still smokes occasional electronic cigarettes, she is encouraged to stop all smoking of nicotine.  Alcohol abuse. -Discussed importance of cessation of alcohol use.  Date: 11/14/2015  MRN# [id number] Evelyn Willis 1955/12/11   Evelyn Willis is a 60 y.o. old female seen in follow up for chief complaint of  Chief Complaint  Patient presents with  . Hospitalization Follow-up    pt states breathing is baseline since last OV, cont sob, occ non prod cough, wheezing, cp/tightness, weakness, nausea.     HPI:   Patient is a 60 year old female, she was seen in the hospital last month. At that time she had a bronchoscopy which showed recurrence of her lung cancer, with left lung atelectasis due to left mainstem endobronchial tumor seen a bronchoscopy on 5/29. She had biopsies of this endobronchial tumor taken, consistent with lung cancer. She also has a known history of COPD with history of smoking and alcohol abuse.  Since getting out of the hospital she has been very physically fatigued and short winded. She has been seen by oncology and they are starting immunotherapy in the next few weeks.  She has stopped smoking, she occasionally uses ecigs about every other day.  She is using spiriva once daily, and ventolin twice daily.    PFT interpretation: Spirometry: FVC is 2.25 L which is 61% of predicted, FEV1 is 1.45 L which is 51% of predicted, FEV to FVC ratio 65 consistent with moderate COPD. There is no significant improvement with bronchodilator therapy Lung volumes: RV is 94% of predicted, TLC 72% of predicted, RV/TLC ratios 128%, consistent with air trapping. Diffusion: Diffusion capacity is 51% of predicted, significant for moderate reduction in diffusing capacity. Review of the flow volume loop is consistent with a obstructive defect. Interpretation: Overall this test shows is suggestive of moderate chronic obstructive pulmonary disease with air trapping, consistent with stage II COPD on the cusp of becoming stage III.   Medication:   Outpatient Encounter Prescriptions as of 11/14/2015  Medication Sig  . albuterol (VENTOLIN HFA) 108 (90 Base) MCG/ACT inhaler Inhale 2 puffs into the lungs every 4 (four) hours as needed.  . ALPRAZolam (XANAX) 1 MG tablet Take 1 mg by mouth at bedtime as needed for anxiety.  Marland Kitchen amiodarone (PACERONE) 200 MG tablet Take 1 tablet (200 mg total) by mouth 2 (two) times daily.  Marland Kitchen amitriptyline (ELAVIL) 75 MG tablet Take 75 mg by mouth at bedtime.  Marland Kitchen amphetamine-dextroamphetamine (ADDERALL) 20 MG tablet Take 20 mg by mouth daily. Reported on 09/23/2015  . buPROPion (WELLBUTRIN XL) 150 MG 24 hr tablet Take 1 tablet by mouth daily.  Marland Kitchen docusate sodium (COLACE) 100 MG capsule Take 1 capsule (100 mg total) by mouth 2 (two)  times daily.  Marland Kitchen HYDROcodone-acetaminophen (NORCO) 10-325 MG tablet Take 1 tablet by mouth every 4 (four) hours as needed.  . metoprolol succinate (TOPROL-XL) 25 MG 24 hr tablet Take 25 mg by mouth daily.  . mirtazapine (REMERON) 15 MG tablet Take 15 mg by mouth at bedtime.  Marland Kitchen morphine (MS CONTIN) 15 MG 12 hr tablet Take 1 tablet by mouth 2  (two) times daily.  . nicotine (NICODERM CQ - DOSED IN MG/24 HOURS) 21 mg/24hr patch Place 21 mg onto the skin daily.  . ondansetron (ZOFRAN) 4 MG tablet Take 1 tablet (4 mg total) by mouth every 8 (eight) hours as needed for nausea or vomiting.  Marland Kitchen SPIRIVA HANDIHALER 18 MCG inhalation capsule Place 1 capsule into inhaler and inhale daily.  . tamsulosin (FLOMAX) 0.4 MG CAPS capsule Take 1 capsule (0.4 mg total) by mouth daily.   No facility-administered encounter medications on file as of 11/14/2015.     Allergies:  Aspirin and Nsaids  Review of Systems: Gen:  Denies  fever, sweats. HEENT: Denies blurred vision. Cvc:  No dizziness, chest pain or heaviness Resp:   Denies cough or sputum porduction. Gi: Denies swallowing difficulty, stomach pain. constipation, bowel incontinence Gu:  Denies bladder incontinence, burning urine Ext:   No Joint pain, stiffness. Skin: No skin rash, easy bruising. Endoc:  No polyuria, polydipsia. Psych: No depression, insomnia. Other:  All other systems were reviewed and found to be negative other than what is mentioned in the HPI.   Physical Examination:   VS: BP 112/62 mmHg  Pulse 94  Ht '5\' 6"'$  (1.676 m)  Wt 111 lb (50.349 kg)  BMI 17.92 kg/m2  SpO2 94%  General Appearance: No distress  Neuro:without focal findings,  speech normal,  HEENT: PERRLA, EOM intact. Tongue is beefy red, with a few red spots on the hard palate Pulmonary: normal breath sounds, No wheezing.  Significantly decreased air entry in the left lung. CardiovascularNormal S1,S2.  No m/r/g.   Abdomen: Benign, Soft, non-tender. Renal:  No costovertebral tenderness  GU:  Not performed at this time. Endoc: No evident thyromegaly, no signs of acromegaly. Skin:   warm, no rash. Extremities: normal, no cyanosis, clubbing.   LABORATORY PANEL:   CBC No results for input(s): WBC, HGB, HCT, PLT in the last 168  hours. ------------------------------------------------------------------------------------------------------------------  Chemistries  No results for input(s): NA, K, CL, CO2, GLUCOSE, BUN, CREATININE, CALCIUM, MG, AST, ALT, ALKPHOS, BILITOT in the last 168 hours.  Invalid input(s): GFRCGP ------------------------------------------------------------------------------------------------------------------  Cardiac Enzymes No results for input(s): TROPONINI in the last 168 hours. ------------------------------------------------------------  RADIOLOGY:   No results found for this or any previous visit. Results for orders placed during the hospital encounter of 10/22/15  DG Chest 2 View   Narrative CLINICAL DATA:  Chest pain  EXAM: CHEST  2 VIEW  COMPARISON:  10/12/2015  FINDINGS: Cardiac shadow is within normal limits. The right lung is hyperinflated and clear. Chronic changes are noted in the left hemi thorax similar to that noted on prior CT examination. This is consistent with the patient's given clinical history of lung carcinoma and treatment. Visualized upper abdomen is within normal limits.  IMPRESSION: Chronic changes in the left hemi thorax consistent with the given clinical history of carcinoma of the lung.  Hyperinflation in the right lung without focal abnormality.   Electronically Signed   By: Inez Catalina M.D.   On: 10/22/2015 21:00    ------------------------------------------------------------------------------------------------------------------  Thank  you for allowing [facility] to assist in the care of your patient. Our recommendations are noted above.  Please contact us if we can be of further service.   Marda Stalker, MD.  [facility] Office Number: [phone]  Patricia Pesa, M.D.  Vilinda Boehringer, M.D.  Merton Border, M.D  11/14/2015

## 2015-11-15 ENCOUNTER — Inpatient Hospital Stay: Payer: PPO

## 2015-11-16 ENCOUNTER — Encounter: Payer: Self-pay | Admitting: Cardiovascular Disease

## 2015-11-16 ENCOUNTER — Ambulatory Visit (INDEPENDENT_AMBULATORY_CARE_PROVIDER_SITE_OTHER): Payer: PPO | Admitting: Cardiovascular Disease

## 2015-11-16 VITALS — BP 84/60 | HR 83 | Ht 67.0 in | Wt 108.0 lb

## 2015-11-16 DIAGNOSIS — R0602 Shortness of breath: Secondary | ICD-10-CM | POA: Diagnosis not present

## 2015-11-16 DIAGNOSIS — J432 Centrilobular emphysema: Secondary | ICD-10-CM

## 2015-11-16 DIAGNOSIS — I2510 Atherosclerotic heart disease of native coronary artery without angina pectoris: Secondary | ICD-10-CM | POA: Diagnosis not present

## 2015-11-16 DIAGNOSIS — F17200 Nicotine dependence, unspecified, uncomplicated: Secondary | ICD-10-CM

## 2015-11-16 DIAGNOSIS — Z720 Tobacco use: Secondary | ICD-10-CM | POA: Diagnosis not present

## 2015-11-16 DIAGNOSIS — I4891 Unspecified atrial fibrillation: Secondary | ICD-10-CM

## 2015-11-16 MED ORDER — FLUDROCORTISONE ACETATE 0.1 MG PO TABS: 0.1000 mg | ORAL_TABLET | Freq: Every day | ORAL | Status: DC

## 2015-11-16 NOTE — Progress Notes (Signed)
Patient ID: Evelyn Willis, female   DOB: 06-08-1955, 60 y.o.   MRN: [id number] Cardiology Office Note  Date:  11/16/2015   ID:  Evelyn Willis, DOB [birth year], MRN [id number]  PCP:  Marcello Fennel, MD   Chief Complaint  Patient presents with  . other    3 month f/u c/o nausea and no appetite. Meds reviewed verbally with pt.    HPI:  60 y.o. female with h/o Lung cancer, radiation and chemotherapy at [facility], atherosclerosis on CT scan, long history of smoking, likely COPD, history of gastric ulcers requiring surgery (secondary to overuse of NSAIDs), who presented to [facility]  with tachycardia, shortness of breath, found to have atrial fibrillation with RVR. She presents today for follow-up for her atrial fibrillation   Prior to admission she had at least one prior episode of tachycardia, likely atrial fibrillation several days prior to admission. Notes indicate stent to coronary artery 10 years ago, no follow-up since that time She was started on amiodarone and anticoagulation in the hospital converting to normal sinus rhythm She did have hematuria on eliquis 5 mg twice a day.  we recommended that she stop the blood thinner She did try one half pill but continued to have pink urine Notes indicating history of alcohol abuse Notes indicating problems with kidney stones, seen by urology, started on Flomax  Recent diagnosis of endobronchial squamous cell cancer left lung  On today's visit, she reports having 12 pound weight gain over the past several months Blood pressure is low, significant lightheadedness when she stands up No appetite, does not feel like eating. Having dental problems She does report drinking fluids, does not take she is dehydrated Stable normal renal function 10/25/2015 Spends most of her day in bed as she does not feel well Denies any symptoms concerning for recurrent atrial fibrillation, no tachycardia  Previously able to quit smoking, now using nicotine  patches  EKG on today's visit shows normal sinus rhythm with rate 83 bpm, no significant ST or T-wave changes  Other past medical history Symptoms started Saturday morning 08/06/2015. She woke at 3 AM with acute onset of tachycardia, felt her heart was pounding out of her chest. She called 911, notes indicated she received several doses of adenosine with no improvement of her arrhythmia. She remained with elevated heart rate. She was taken to the emergency room, started on amiodarone infusion also started on heparin infusion. Converted shortly to normal sinus rhythm on the floor 24 hours ago.  PMH:   has a past medical history of Depression; Multiple sclerosis ([place]); Coronary artery disease; Hematuria; Atrial fibrillation ([place]); COPD (chronic obstructive pulmonary disease) ([place]); Emphysema lung ([place]); Renal calculi; and Cancer ([place]) (2016).  PSH:    Past Surgical History  Procedure Laterality Date  . Stomach ulcer removal  2011  . Total hip arthroplasty  2011  . Extracorporeal shock wave lithotripsy Right 11/03/2015    Procedure: EXTRACORPOREAL SHOCK WAVE LITHOTRIPSY (ESWL);  Surgeon: Hollice Espy, MD;  Location: [facility] ORS;  Service: Urology;  Laterality: Right;    Current Outpatient Prescriptions  Medication Sig Dispense Refill  . albuterol (VENTOLIN HFA) 108 (90 Base) MCG/ACT inhaler Inhale 2 puffs into the lungs every 4 (four) hours as needed.    . ALPRAZolam (XANAX) 1 MG tablet Take 1 mg by mouth at bedtime as needed for anxiety.    Marland Kitchen amiodarone (PACERONE) 200 MG tablet Take 1 tablet (200 mg total) by mouth 2 (two) times daily. 60 tablet 3  .  amitriptyline (ELAVIL) 75 MG tablet Take 75 mg by mouth at bedtime.    Marland Kitchen amphetamine-dextroamphetamine (ADDERALL) 20 MG tablet Take 20 mg by mouth daily. Reported on 09/23/2015    . buPROPion (WELLBUTRIN XL) 150 MG 24 hr tablet Take 1 tablet by mouth daily.    Marland Kitchen docusate sodium (COLACE) 100 MG capsule Take 1 capsule (100 mg total) by mouth 2 (two)  times daily. 60 capsule 0  . HYDROcodone-acetaminophen (NORCO) 10-325 MG tablet Take 1 tablet by mouth every 4 (four) hours as needed. 50 tablet 0  . mirtazapine (REMERON) 15 MG tablet Take 15 mg by mouth at bedtime.    Marland Kitchen morphine (MS CONTIN) 15 MG 12 hr tablet Take 1 tablet by mouth 2 (two) times daily.    . nicotine (NICODERM CQ - DOSED IN MG/24 HOURS) 21 mg/24hr patch Place 21 mg onto the skin daily.    Marland Kitchen nystatin (MYCOSTATIN) 100000 UNIT/ML suspension Take 5 mLs (500,000 Units total) by mouth 4 (four) times daily. 200 mL 0  . SPIRIVA HANDIHALER 18 MCG inhalation capsule Place 1 capsule into inhaler and inhale daily.    . tamsulosin (FLOMAX) 0.4 MG CAPS capsule Take 1 capsule (0.4 mg total) by mouth daily. 30 capsule 0   No current facility-administered medications for this visit.     Allergies:   Aspirin and Nsaids   Social History:  The patient  reports that she quit smoking about 3 months ago. She does not have any smokeless tobacco history on file. She reports that she drinks alcohol. She reports that she does not use illicit drugs.   Family History:   family history includes Atrial fibrillation in her mother; CAD in her mother. There is no history of Heart attack, Hypertension, Stroke, or Kidney disease.    Review of Systems: Review of Systems  Constitutional: Positive for weight loss and malaise/fatigue.  Respiratory: Negative.   Cardiovascular: Negative.   Gastrointestinal: Negative.   Musculoskeletal: Negative.   Neurological: Positive for weakness.  Psychiatric/Behavioral: Negative.   All other systems reviewed and are negative.    PHYSICAL EXAM: VS:  BP 84/60 mmHg  Pulse 83  Ht '5\' 7"'$  (1.702 m)  Wt 108 lb (48.988 kg)  BMI 16.91 kg/m2 , BMI Body mass index is 16.91 kg/(m^2). GEN: Very thin, in no acute distress HEENT: normal Neck: no JVD, carotid bruits, or masses Cardiac: RRR; no murmurs, rubs, or gallops,no edema  Respiratory:  Moderately Decreased breath  sounds throughout, normal work of breathing GI: soft, nontender, nondistended, + BS MS: no deformity or atrophy Skin: warm and dry, no rash Neuro:  Strength and sensation are intact Psych: euthymic mood, full affect    Recent Labs: 08/06/2015: TSH 1.310 10/22/2015: ALT 34 10/24/2015: Magnesium 1.9 10/25/2015: BUN 9; Creatinine, Ser 0.66; Hemoglobin 11.0*; Platelets 297; Potassium 3.6; Sodium 137    Lipid Panel No results found for: CHOL, HDL, LDLCALC, TRIG    Wt Readings from Last 3 Encounters:  11/16/15 108 lb (48.988 kg)  11/14/15 111 lb (50.349 kg)  11/10/15 113 lb 8.6 oz (51.5 kg)       ASSESSMENT AND PLAN:  Atrial fibrillation, unspecified type ([place]) - Plan: EKG 12-Lead Maintaining normal sinus rhythm mobile continue amiodarone Not on anticoagulation given previous hematuria  Coronary artery disease involving native coronary artery of native heart without angina pectoris Currently with no symptoms of angina. No further workup at this time. Continue current medication regimen.  Shortness of breath Stable shortness of breath, followed by  pulmonary. Underlying lung cancer, history of emphysema  Smoker We have encouraged her to continue to work on weaning her cigarettes and smoking cessation. She will continue to work on this and does not want any assistance with chantix.   Centrilobular emphysema ([place]) As above, recommended smoking cessation. She is on inhalers  Lung cancer Followed by Dr. Grayland Ormond Dramatic weight loss, anorexia 12 pound loss in the past 3 months Likely contributing to her dramatic orthostasis on today's visit Given very low pressure (70 systolic on my check), recommended she stop the Flomax, Suggested she start fludrocortisone 0.1 mg daily We'll recheck lab work, BMP, in one month If she continues to have symptoms of orthostasis, may need to add midodrine twice a day, up to 3 times a day  Orthostasis Secondary to dramatic weight loss, 12  pounds in the past several months, previously was 150, now 108 Less likely dehydration given  normal renal function Needs to eat, may benefit from Megace or similar agents   Total encounter time more than 25 minutes  Greater than 50% was spent in counseling and coordination of care with the patient   Disposition:   F/U  6 months   Orders Placed This Encounter  Procedures  . EKG 12-Lead     Signed, Esmond Plants, M.D., Ph.D. 11/16/2015  [place], [place]

## 2015-11-16 NOTE — Patient Instructions (Addendum)
Please start florinef one pill daily This will help push blood pressure upwards If you develop leg swelling, Decrease the pill to every other day  We will set up a lab/bmp in one month  EAT!!!   Stop the flomax  Please call us if you have new issues that need to be addressed before your next appt.  Your physician wants you to follow-up in: 3 months.  You will receive a reminder letter in the mail two months in advance. If you don't receive a letter, please call our office to schedule the follow-up appointment.

## 2015-11-17 ENCOUNTER — Encounter: Payer: Self-pay | Admitting: Urology

## 2015-11-17 ENCOUNTER — Ambulatory Visit: Payer: PPO | Admitting: Urology

## 2015-11-17 ENCOUNTER — Other Ambulatory Visit: Payer: Self-pay | Admitting: Oncology

## 2015-11-17 DIAGNOSIS — C3402 Malignant neoplasm of left main bronchus: Secondary | ICD-10-CM

## 2015-11-17 LAB — CYTOLOGY - NON PAP

## 2015-11-17 MED ORDER — LIDOCAINE-PRILOCAINE 2.5-2.5 % EX CREA: TOPICAL_CREAM | CUTANEOUS | Status: DC

## 2015-11-18 ENCOUNTER — Telehealth: Payer: Self-pay | Admitting: *Deleted

## 2015-11-18 ENCOUNTER — Telehealth: Payer: Self-pay | Admitting: Cardiovascular Disease

## 2015-11-18 NOTE — Telephone Encounter (Signed)
Pt was started on Florinef 0.1 mg daily at last ov 11/16/15. Pt states that she thought that was to improve her appetite.  She is appreciative for the clarification and will call back w/ any further questions or concerns.

## 2015-11-18 NOTE — Telephone Encounter (Signed)
Spoke with patient at length regarding concerns about low blood pressure, fatigue, dizziness, mouth sores, and losing weight. Reviewed plan of care, encouraged to contact Dr. Donivan Scull office to discuss management of blood pressure (recent note discusses possibly adding midadrine), and to keep appointment with Dr. Grayland Ormond on Monday to review labs and discuss potential chemotherapy start depending on patients status at that time. Patient is in agreement with this plan. Instructed patient to go to the ER at any time that she feels unsafe or is condition deteriorates during the weekend.

## 2015-11-18 NOTE — Telephone Encounter (Signed)
Pt calling stating per her Oncologist Dr Aggie Cosier   They would  Like to see if we can work on giving something to patient to higher her BP It is too low she states Please advise.

## 2015-11-21 ENCOUNTER — Inpatient Hospital Stay: Payer: PPO

## 2015-11-21 ENCOUNTER — Other Ambulatory Visit: Payer: Self-pay | Admitting: *Deleted

## 2015-11-21 ENCOUNTER — Inpatient Hospital Stay (HOSPITAL_BASED_OUTPATIENT_CLINIC_OR_DEPARTMENT_OTHER): Payer: PPO | Admitting: Oncology

## 2015-11-21 VITALS — BP 81/55 | HR 70 | Temp 96.8°F | Resp 18 | Wt 108.9 lb

## 2015-11-21 DIAGNOSIS — J449 Chronic obstructive pulmonary disease, unspecified: Secondary | ICD-10-CM

## 2015-11-21 DIAGNOSIS — I4891 Unspecified atrial fibrillation: Secondary | ICD-10-CM

## 2015-11-21 DIAGNOSIS — Z9221 Personal history of antineoplastic chemotherapy: Secondary | ICD-10-CM

## 2015-11-21 DIAGNOSIS — J189 Pneumonia, unspecified organism: Secondary | ICD-10-CM | POA: Diagnosis not present

## 2015-11-21 DIAGNOSIS — R634 Abnormal weight loss: Secondary | ICD-10-CM

## 2015-11-21 DIAGNOSIS — R0602 Shortness of breath: Secondary | ICD-10-CM

## 2015-11-21 DIAGNOSIS — G35 Multiple sclerosis: Secondary | ICD-10-CM | POA: Diagnosis not present

## 2015-11-21 DIAGNOSIS — C779 Secondary and unspecified malignant neoplasm of lymph node, unspecified: Secondary | ICD-10-CM | POA: Diagnosis not present

## 2015-11-21 DIAGNOSIS — J9811 Atelectasis: Secondary | ICD-10-CM

## 2015-11-21 DIAGNOSIS — C3402 Malignant neoplasm of left main bronchus: Secondary | ICD-10-CM | POA: Diagnosis not present

## 2015-11-21 DIAGNOSIS — Z87891 Personal history of nicotine dependence: Secondary | ICD-10-CM

## 2015-11-21 DIAGNOSIS — R5383 Other fatigue: Secondary | ICD-10-CM

## 2015-11-21 DIAGNOSIS — R079 Chest pain, unspecified: Secondary | ICD-10-CM

## 2015-11-21 DIAGNOSIS — C3490 Malignant neoplasm of unspecified part of unspecified bronchus or lung: Secondary | ICD-10-CM

## 2015-11-21 DIAGNOSIS — R05 Cough: Secondary | ICD-10-CM

## 2015-11-21 DIAGNOSIS — I700 Atherosclerosis of aorta: Secondary | ICD-10-CM

## 2015-11-21 DIAGNOSIS — J439 Emphysema, unspecified: Secondary | ICD-10-CM

## 2015-11-21 DIAGNOSIS — F329 Major depressive disorder, single episode, unspecified: Secondary | ICD-10-CM

## 2015-11-21 DIAGNOSIS — Z79899 Other long term (current) drug therapy: Secondary | ICD-10-CM

## 2015-11-21 DIAGNOSIS — Z87442 Personal history of urinary calculi: Secondary | ICD-10-CM

## 2015-11-21 DIAGNOSIS — R531 Weakness: Secondary | ICD-10-CM

## 2015-11-21 DIAGNOSIS — R630 Anorexia: Secondary | ICD-10-CM

## 2015-11-21 DIAGNOSIS — R7989 Other specified abnormal findings of blood chemistry: Secondary | ICD-10-CM

## 2015-11-21 DIAGNOSIS — F419 Anxiety disorder, unspecified: Secondary | ICD-10-CM

## 2015-11-21 DIAGNOSIS — N201 Calculus of ureter: Secondary | ICD-10-CM

## 2015-11-21 DIAGNOSIS — I2510 Atherosclerotic heart disease of native coronary artery without angina pectoris: Secondary | ICD-10-CM

## 2015-11-21 DIAGNOSIS — D649 Anemia, unspecified: Secondary | ICD-10-CM

## 2015-11-21 DIAGNOSIS — Z923 Personal history of irradiation: Secondary | ICD-10-CM

## 2015-11-21 DIAGNOSIS — Z7952 Long term (current) use of systemic steroids: Secondary | ICD-10-CM

## 2015-11-21 DIAGNOSIS — J90 Pleural effusion, not elsewhere classified: Secondary | ICD-10-CM

## 2015-11-21 LAB — COMPREHENSIVE METABOLIC PANEL
ALK PHOS: 102 U/L (ref 38–126)
ALT: 17 U/L (ref 14–54)
AST: 19 U/L (ref 15–41)
Albumin: 2.9 g/dL — ABNORMAL LOW (ref 3.5–5.0)
Anion gap: 8 (ref 5–15)
BUN: 17 mg/dL (ref 6–20)
CALCIUM: 9 mg/dL (ref 8.9–10.3)
CO2: 26 mmol/L (ref 22–32)
CREATININE: 0.79 mg/dL (ref 0.44–1.00)
Chloride: 103 mmol/L (ref 101–111)
Glucose, Bld: 115 mg/dL — ABNORMAL HIGH (ref 65–99)
Potassium: 4.9 mmol/L (ref 3.5–5.1)
Sodium: 137 mmol/L (ref 135–145)
Total Bilirubin: 0.4 mg/dL (ref 0.3–1.2)
Total Protein: 7.1 g/dL (ref 6.5–8.1)

## 2015-11-21 LAB — CBC WITH DIFFERENTIAL/PLATELET
Basophils Absolute: 0.1 10*3/uL (ref 0–0.1)
Basophils Relative: 1 %
Eosinophils Absolute: 0.2 10*3/uL (ref 0–0.7)
Eosinophils Relative: 1 %
HEMATOCRIT: 34.7 % — AB (ref 35.0–47.0)
HEMOGLOBIN: 11.6 g/dL — AB (ref 12.0–16.0)
LYMPHS ABS: 0.7 10*3/uL — AB (ref 1.0–3.6)
LYMPHS PCT: 5 %
MCH: 29.6 pg (ref 26.0–34.0)
MCHC: 33.3 g/dL (ref 32.0–36.0)
MCV: 88.8 fL (ref 80.0–100.0)
Monocytes Absolute: 0.8 10*3/uL (ref 0.2–0.9)
Monocytes Relative: 6 %
NEUTROS ABS: 12.4 10*3/uL — AB (ref 1.4–6.5)
NEUTROS PCT: 87 %
Platelets: 479 10*3/uL — ABNORMAL HIGH (ref 150–440)
RBC: 3.91 MIL/uL (ref 3.80–5.20)
RDW: 15.3 % — ABNORMAL HIGH (ref 11.5–14.5)
WBC: 14.2 10*3/uL — AB (ref 3.6–11.0)

## 2015-11-21 MED ORDER — SODIUM CHLORIDE 0.9 % IV SOLN
Freq: Once | INTRAVENOUS | Status: AC
  Administered 2015-11-21: 15:00:00 via INTRAVENOUS
  Filled 2015-11-21: qty 1000

## 2015-11-21 MED ORDER — SODIUM CHLORIDE 0.9 % IV SOLN
240.0000 mg | Freq: Once | INTRAVENOUS | Status: AC
  Administered 2015-11-21: 240 mg via INTRAVENOUS
  Filled 2015-11-21: qty 20

## 2015-11-21 NOTE — Progress Notes (Signed)
States continues to have pain in both lungs. Taking pain medication that helps relieve pain for short period of time. Continues to have nausea, vomiting, and decreased appetite. Would like to discuss with MD regarding pain medication and if can switch to something stronger to help control pain. BP remains low and is followed by Dr. Rockey Situ.

## 2015-11-21 NOTE — Progress Notes (Signed)
Patient for new start on opdivo.  Need baseline TSH ordered.  Sent message to physician RN.

## 2015-11-22 LAB — THYROID PANEL WITH TSH
FREE THYROXINE INDEX: 4.5 (ref 1.2–4.9)
T3 UPTAKE RATIO: 43 % — AB (ref 24–39)
T4 TOTAL: 10.4 ug/dL (ref 4.5–12.0)
TSH: 0.795 u[IU]/mL (ref 0.450–4.500)

## 2015-11-22 NOTE — Progress Notes (Signed)
[place]  [phone] [phone]  ID: Evelyn Willis OB: 11/13/1955  MR#: [id number]  KAJ#:[id number]  Patient Care Team: Derinda Late, MD as PCP - General (Family Medicine) Minna Merritts, MD as Consulting Physician (Cardiology)  CHIEF COMPLAINT:  Chief Complaint  Patient presents with  . Lung Cancer    INTERVAL HISTORY: Patient returns to clinic for further evaluation and initiation of second line therapy using nivolumab.  She continues to have pain and is requesting a change in her narcotic regimen. She has weakness and fatigue. She has a poor appetite with weight loss. She continues to have both dyspnea on exertion and shortness of breath. She has no neurologic complaints. She denies any fevers. She has increased nausea, but denies vomiting, constipation, or diarrhea. She has no urinary complaints. Patient otherwise feels well and offers no further specific complaints.   REVIEW OF SYSTEMS:   Review of Systems  Constitutional: Positive for weight loss and malaise/fatigue. Negative for fever.  Respiratory: Positive for cough and shortness of breath.   Cardiovascular: Positive for chest pain.  Gastrointestinal: Positive for nausea. Negative for abdominal pain, blood in stool and melena.  Musculoskeletal: Positive for back pain and joint pain.  Neurological: Negative.   Psychiatric/Behavioral: The patient is nervous/anxious.     As per HPI. Otherwise, a complete review of systems is negatve.  PAST MEDICAL HISTORY: Past Medical History  Diagnosis Date  . Depression   . Multiple sclerosis ([place])   . Coronary artery disease     said- stent was placed 10 yrs ago from now( 2017) in [place]  . Hematuria   . Atrial fibrillation ([place])   . COPD (chronic obstructive pulmonary disease) ([place])   . Emphysema lung ([place])   . Renal calculi   . Cancer [facility]) 2016    Left lung cancer, radiation, chemo    PAST SURGICAL HISTORY: Past  Surgical History  Procedure Laterality Date  . Stomach ulcer removal  2011  . Total hip arthroplasty  2011  . Extracorporeal shock wave lithotripsy Right 11/03/2015    Procedure: EXTRACORPOREAL SHOCK WAVE LITHOTRIPSY (ESWL);  Surgeon: Hollice Espy, MD;  Location: [facility] ORS;  Service: Urology;  Laterality: Right;    FAMILY HISTORY Family History  Problem Relation Age of Onset  . CAD Mother   . Atrial fibrillation Mother   . Heart attack Neg Hx   . Hypertension Neg Hx   . Stroke Neg Hx   . Kidney disease Neg Hx        ADVANCED DIRECTIVES:    HEALTH MAINTENANCE: Social History  Substance Use Topics  . Smoking status: Former Smoker -- 1.50 packs/day for 45 years    Quit date: 07/26/2015  . Smokeless tobacco: Not on file     Comment: x 2 weeks  . Alcohol Use: 0.0 oz/week    0 Standard drinks or equivalent per week     Colonoscopy:  PAP:  Bone density:  Lipid panel:  Allergies  Allergen Reactions  . Aspirin Other (See Comments)    Pt gets bleeding ulcers when she takes this medication Bleeding ulcers  . Nsaids Other (See Comments)    Other reaction(s): Other (See Comments) Cannot take due to significant peptic ulcer disease. Cannot take due to significant peptic ulcer disease.    Current Outpatient Prescriptions  Medication Sig Dispense Refill  . albuterol (VENTOLIN HFA) 108 (90 Base) MCG/ACT inhaler Inhale 2 puffs into the lungs every 4 (four) hours as needed.    Marland Kitchen  ALPRAZolam (XANAX) 1 MG tablet Take 1 mg by mouth at bedtime as needed for anxiety.    Marland Kitchen amiodarone (PACERONE) 200 MG tablet Take 1 tablet (200 mg total) by mouth 2 (two) times daily. 60 tablet 3  . amitriptyline (ELAVIL) 75 MG tablet Take 75 mg by mouth at bedtime.    Marland Kitchen amphetamine-dextroamphetamine (ADDERALL) 20 MG tablet Take 20 mg by mouth daily. Reported on 09/23/2015    . buPROPion (WELLBUTRIN XL) 150 MG 24 hr tablet Take 1 tablet by mouth daily.    Marland Kitchen docusate sodium (COLACE) 100 MG capsule Take 1  capsule (100 mg total) by mouth 2 (two) times daily. 60 capsule 0  . fludrocortisone (FLORINEF) 0.1 MG tablet Take 1 tablet (0.1 mg total) by mouth daily. 30 tablet 6  . HYDROcodone-acetaminophen (NORCO) 10-325 MG tablet Take 1 tablet by mouth every 4 (four) hours as needed. 50 tablet 0  . lidocaine-prilocaine (EMLA) cream Apply to affected area once 30 g 3  . mirtazapine (REMERON) 15 MG tablet Take 15 mg by mouth at bedtime.    Marland Kitchen morphine (MS CONTIN) 15 MG 12 hr tablet Take 1 tablet by mouth 2 (two) times daily.    . nicotine (NICODERM CQ - DOSED IN MG/24 HOURS) 21 mg/24hr patch Place 21 mg onto the skin daily.    Marland Kitchen nystatin (MYCOSTATIN) 100000 UNIT/ML suspension Take 5 mLs (500,000 Units total) by mouth 4 (four) times daily. 200 mL 0  . SPIRIVA HANDIHALER 18 MCG inhalation capsule Place 1 capsule into inhaler and inhale daily.     No current facility-administered medications for this visit.    OBJECTIVE: Filed Vitals:   11/21/15 1350  BP: 81/55  Pulse: 70  Temp: 96.8 F (36 C)  Resp: 18     Body mass index is 17.05 kg/(m^2).    ECOG FS:1 - Symptomatic but completely ambulatory  General: Well-developed, well-nourished, no acute distress. Eyes: Pink conjunctiva, anicteric sclera. Lungs: Diminished left breast sounds. Scattered wheezing throughout. Heart: Regular rate and rhythm. No rubs, murmurs, or gallops. Abdomen: Soft, nontender, nondistended. No organomegaly noted, normoactive bowel sounds. Musculoskeletal: No edema, cyanosis, or clubbing. Neuro: Alert, answering all questions appropriately. Cranial nerves grossly intact. Skin: No rashes or petechiae noted. Psych: Normal affect.   LAB RESULTS:  Lab Results  Component Value Date   NA 137 11/21/2015   K 4.9 11/21/2015   CL 103 11/21/2015   CO2 26 11/21/2015   GLUCOSE 115* 11/21/2015   BUN 17 11/21/2015   CREATININE 0.79 11/21/2015   CALCIUM 9.0 11/21/2015   PROT 7.1 11/21/2015   ALBUMIN 2.9* 11/21/2015   AST 19  11/21/2015   ALT 17 11/21/2015   ALKPHOS 102 11/21/2015   BILITOT 0.4 11/21/2015   GFRNONAA >60 11/21/2015   GFRAA >60 11/21/2015    Lab Results  Component Value Date   WBC 14.2* 11/21/2015   NEUTROABS 12.4* 11/21/2015   HGB 11.6* 11/21/2015   HCT 34.7* 11/21/2015   MCV 88.8 11/21/2015   PLT 479* 11/21/2015     STUDIES: Dg Abd 1 View  11/07/2015  CLINICAL DATA:  Kidney stones on right side, lithotripsy on Friday. EXAM: ABDOMEN - 1 VIEW COMPARISON:  10/31/2015 and CT abdomen 10/12/2015. FINDINGS: There are likely tiny calcified stones projecting over the lower poles of both kidneys. A 5 mm calcification is again seen in the right anatomic pelvis and may represent a distal right ureteral stone. Stool is seen throughout the colon. IMPRESSION: 1. Possible residual distal right  ureteral stone. 2. Bilateral renal stones. Electronically Signed   By: Lorin Picket M.D.   On: 11/07/2015 11:36   Dg Abd 1 View  10/31/2015  CLINICAL DATA:  Right ureteral calculus. EXAM: ABDOMEN - 1 VIEW COMPARISON:  10/26/2015 and 10/17/2015. CT of the abdomen and pelvis on 10/12/2015. FINDINGS: Similar calcification in the right lateral pelvis measuring 5 mm which appears to most likely represent the distal ureteral calculus seen previously. This is in a similar position to the prior radiograph on 10/26/2015. No additional abnormal calcifications are seen with stable phleboliths in the pelvis. Moderate fecal material is present throughout the colon without evidence of overt bowel obstruction. Clips present related to prior cholecystectomy. IMPRESSION: No significant change in appearance of a focal calcification in the right lateral pelvis measuring approximately 5 mm and most likely representing the previously identified distal right ureteral calculus. Electronically Signed   By: Aletta Edouard M.D.   On: 10/31/2015 15:25   Abdomen 1 View (kub)  10/27/2015  CLINICAL DATA:  Kidney stone. EXAM: ABDOMEN - 1 VIEW  COMPARISON:  10/17/2015.  CT 10/12/2015. FINDINGS: Previously identified 6 mm calculus projected over the right lower has slightly progressed more distally. Vascular calcifications again noted throughout the pelvis. Large amount stool again noted throughout the colon. Surgical clips right upper quadrant. Left hip replacement. IMPRESSION: 1. 6 mm distal right ureteral stone has progressed slightly more distally. 2. Large amount of stool in colon.  Constipation cannot be excluded. Electronically Signed   By: Marcello Moores  Register   On: 10/27/2015 08:36   Nm Pet Image Initial (pi) Skull Base To Thigh  11/07/2015  CLINICAL DATA:  Subsequent treatment strategy for recurrent squamous cell lung cancer. History COPD. EXAM: NUCLEAR MEDICINE PET SKULL BASE TO THIGH TECHNIQUE: 12.3 mCi F-18 FDG was injected intravenously. Full-ring PET imaging was performed from the skull base to thigh after the radiotracer. CT data was obtained and used for attenuation correction and anatomic localization. FASTING BLOOD GLUCOSE:  Value: 109 mg/dl COMPARISON:  Chest CT of 10/22/2015. Abdominal pelvic CT of 10/12/2015. FINDINGS: NECK No areas of abnormal hypermetabolism. CHEST High left mediastinal node (called supraclavicular on the prior exam) is not significantly hypermetabolic. Measures 13 mm and a S.U.V. max of 2.4 on image 77/series 3. Similar to the surrounding mediastinal pool. Multi focal hypermetabolism within the compressed left lung. An example area anteriorly measures a S.U.V. max of 5.9 including on image 89/series 3. There is circumferential hypermetabolism with central photopenia about the left infrahilar region and corresponding to the left mainstem bronchus obstruction and surrounding mass. This measures on the order of 4.4 x 5.5 cm and a S.U.V. max of 11.3, including on image 104/series 3. Small left pleural effusion with low-level posterior pleural hypermetabolism, measuring a S.U.V. max of 3.2. ABDOMEN/PELVIS No areas of  abnormal hypermetabolism. SKELETON No abnormal marrow activity. CT IMAGES PERFORMED FOR ATTENUATION CORRECTION No cervical adenopathy. Chest findings deferred to recent diagnostic CT. Multivessel coronary artery atherosclerosis. Centrilobular emphysema. Normal adrenal glands. Bilateral renal collecting system calculi. Cholecystectomy. Left hip arthroplasty with resultant beam hardening artifact. Pelvic floor laxity. Hysterectomy. Radiation changes involving thoracic vertebral bodies. IMPRESSION: 1. Multi focal hypermetabolism within the collapsed left lung, including a dominant central left infrahilar peripherally hypermetabolic and likely centrally necrotic mass. 2. No hypermetabolic thoracic nodes, including the enlarged node detailed on the prior diagnostic CT. 3. No extrathoracic metastatic disease. 4. Left pleural effusion with nonspecific low-level hypermetabolism. 5. Age advanced atherosclerosis, including within the coronary arteries. Electronically  Signed   By: Abigail Miyamoto M.D.   On: 11/07/2015 12:01   Dg Chest Port 1 View  10/24/2015  CLINICAL DATA:  Left lung consolidation EXAM: PORTABLE CHEST 1 VIEW COMPARISON:  10/22/2015 FINDINGS: Right lung is hyperinflated. Some mediastinal shift to the left is noted with significant volume loss on the left. The minimally aerated the left lung now demonstrates complete consolidation. Pleural effusion is present as well based on recent CT. No bony abnormality is noted. IMPRESSION: Continued consolidation of the left lung with mediastinal shift from right to left. Abrupt cut off of the left mainstem bronchus is noted likely related to obstructing mass. Electronically Signed   By: Inez Catalina M.D.   On: 10/24/2015 07:47    ONCOLOGIC HISTORY: Patient was initially diagnosed with squamous cell carcinoma the lung in June 2016. PET scan at that time revealed a 2.9 cm left hilar mass with no evidence of lymphadenopathy or distant metastasis. She was stage IIa disease  based on one level one lymph node that returned positive for squamous cell on EBUS. She proceeded with concurrent chemotherapy and XRT at [facility]. She initially was started on carboplatinum and Taxol. Her XRT started on December 09, 2014 and she completed 6600 cGy in 33 fractions. She had a Taxol infusion reaction on cycle 4 and was switched to Abraxane '25mg'$ /m2 for cycle 5. Patient completed 7 weekly cycles of chemotherapy in approximately November 2016. By report, post-treatment scan revealed "stable disease".    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  PLAN:    1. Lung cancer: PET scan results reviewed independently and reported as above. Biopsy confirmed recurrent/persistent squamous cell carcinoma. Proceed with cycle 1 of 6 of salvage nivolumab . Return to clinic in 2 weeks for consideration of cycle 2. Will reimage at the conclusion of cycle 6.  2. Multiple sclerosis: Managed at [facility]. By report patient no longer sees her neurologist but continues at pain clinic at [facility]. 3. Postoperative pneumonia: Continue current antibiotics.  4. Anemia: Mild, monitor. 5. Pain: Patient has been instructed that any adjustments her new prescriptions for her pain should be obtained by her pain clinic at [facility]. This was ongoing prior to diagnosis cancer and therefore likely unrelated.  Patient expressed understanding and was in agreement with this plan. She also understands that She can call clinic at any time with any questions, concerns, or complaints.   Lloyd Huger, MD   11/22/2015 11:25 PM

## 2015-11-24 ENCOUNTER — Telehealth: Payer: Self-pay | Admitting: *Deleted

## 2015-11-24 NOTE — Telephone Encounter (Signed)
Pt requests refill of hydrocodone-acetaminophen 10-'325mg'$  and morphine '15mg'$ . Per Dr. Grayland Ormond, pt is followed by [facility] and will need to get refills from their clinic unless there is a written order from an MD at the [facility] stating that Dr. Grayland Ormond may prescribe pain medication for patient while undergoing treatments. Pt was instructed to contact [facility] and get order faxed to [place] in [place] or [place]. Pt verbalized understanding.

## 2015-11-25 ENCOUNTER — Other Ambulatory Visit: Payer: Self-pay | Admitting: *Deleted

## 2015-11-25 ENCOUNTER — Telehealth: Payer: Self-pay | Admitting: *Deleted

## 2015-11-25 MED ORDER — HYDROCODONE-ACETAMINOPHEN 10-325 MG PO TABS: 1.0000 | ORAL_TABLET | Freq: Four times a day (QID) | ORAL | Status: DC | PRN

## 2015-11-25 NOTE — Telephone Encounter (Signed)
Patient called requesting refill for pain medication. Patient was under pain contract with [facility] so she was instructed to contact [facility] for permission for Dr. Grayland Ormond to refill medication early. Patient contacted [facility] and was informed she had breached her contract by taking more than the prescribed amount of pain medication. Dr. Grayland Ormond agreed to have patient sign pain contract and prescribe pain medications during treatment. Patient notified that Dr. Grayland Ormond will fill 10 day supply of Hydrocodone to get her through until her next follow up appointment on 7/10. Patient verbalized understanding and is in agreement with plan.

## 2015-11-26 NOTE — Progress Notes (Signed)
[place]  [phone] [phone]) [phone]  ID: NURY NEBERGALL OB: Sep 09, 1955  MR#: [id number]  QIO#:[id number]  Patient Care Team: Derinda Late, MD as PCP - General (Family Medicine) Minna Merritts, MD as Consulting Physician (Cardiology)  CHIEF COMPLAINT:  Chief Complaint  Patient presents with  . Lung Cancer    INTERVAL HISTORY: Patient returns to clinic for further evaluation and treatment planning. She complains of bilateral "lung pain", but otherwise feels well. She has weakness and fatigue. She has a poor appetite with continued weight loss. She continues to have both dyspnea on exertion and shortness of breath. She has no neurologic complaints. She denies any fevers. She has no nausea, vomiting, constipation, or diarrhea. She has no urinary complaints. Patient otherwise feels well and offers no further specific complaints.   REVIEW OF SYSTEMS:   Review of Systems  Constitutional: Positive for weight loss and malaise/fatigue. Negative for fever.  Respiratory: Positive for cough and shortness of breath.   Cardiovascular: Positive for chest pain.  Gastrointestinal: Negative.  Negative for abdominal pain, blood in stool and melena.  Musculoskeletal: Negative.   Neurological: Positive for weakness.  Psychiatric/Behavioral: The patient is nervous/anxious.     As per HPI. Otherwise, a complete review of systems is negatve.  PAST MEDICAL HISTORY: Past Medical History  Diagnosis Date  . Depression   . Multiple sclerosis ([place])   . Coronary artery disease     said- stent was placed 10 yrs ago from now( 2017) in [place], [place]  . Hematuria   . Atrial fibrillation ([place])   . COPD (chronic obstructive pulmonary disease) ([place])   . Emphysema lung ([place])   . Renal calculi   . Cancer [facility]) 2016    Left lung cancer, radiation, chemo    PAST SURGICAL HISTORY: Past Surgical History  Procedure Laterality Date  . Stomach ulcer removal  2011  .  Total hip arthroplasty  2011  . Extracorporeal shock wave lithotripsy Right 11/03/2015    Procedure: EXTRACORPOREAL SHOCK WAVE LITHOTRIPSY (ESWL);  Surgeon: Hollice Espy, MD;  Location: [facility] ORS;  Service: Urology;  Laterality: Right;    FAMILY HISTORY Family History  Problem Relation Age of Onset  . CAD Mother   . Atrial fibrillation Mother   . Heart attack Neg Hx   . Hypertension Neg Hx   . Stroke Neg Hx   . Kidney disease Neg Hx        ADVANCED DIRECTIVES:    HEALTH MAINTENANCE: Social History  Substance Use Topics  . Smoking status: Former Smoker -- 1.50 packs/day for 45 years    Quit date: 07/26/2015  . Smokeless tobacco: Not on file     Comment: x 2 weeks  . Alcohol Use: 0.0 oz/week    0 Standard drinks or equivalent per week     Colonoscopy:  PAP:  Bone density:  Lipid panel:  Allergies  Allergen Reactions  . Aspirin Other (See Comments)    Pt gets bleeding ulcers when she takes this medication Bleeding ulcers  . Nsaids Other (See Comments)    Other reaction(s): Other (See Comments) Cannot take due to significant peptic ulcer disease. Cannot take due to significant peptic ulcer disease.    Current Outpatient Prescriptions  Medication Sig Dispense Refill  . albuterol (VENTOLIN HFA) 108 (90 Base) MCG/ACT inhaler Inhale 2 puffs into the lungs every 4 (four) hours as needed.    . ALPRAZolam (XANAX) 1 MG tablet Take 1 mg by mouth at bedtime as  needed for anxiety.    Marland Kitchen amiodarone (PACERONE) 200 MG tablet Take 1 tablet (200 mg total) by mouth 2 (two) times daily. 60 tablet 3  . amitriptyline (ELAVIL) 75 MG tablet Take 75 mg by mouth at bedtime.    Marland Kitchen amphetamine-dextroamphetamine (ADDERALL) 20 MG tablet Take 20 mg by mouth daily. Reported on 09/23/2015    . buPROPion (WELLBUTRIN XL) 150 MG 24 hr tablet Take 1 tablet by mouth daily.    Marland Kitchen docusate sodium (COLACE) 100 MG capsule Take 1 capsule (100 mg total) by mouth 2 (two) times daily. 60 capsule 0  . mirtazapine  (REMERON) 15 MG tablet Take 15 mg by mouth at bedtime.    Marland Kitchen morphine (MS CONTIN) 15 MG 12 hr tablet Take 1 tablet by mouth 2 (two) times daily.    . nicotine (NICODERM CQ - DOSED IN MG/24 HOURS) 21 mg/24hr patch Place 21 mg onto the skin daily.    Marland Kitchen SPIRIVA HANDIHALER 18 MCG inhalation capsule Place 1 capsule into inhaler and inhale daily.    . fludrocortisone (FLORINEF) 0.1 MG tablet Take 1 tablet (0.1 mg total) by mouth daily. 30 tablet 6  . HYDROcodone-acetaminophen (NORCO) 10-325 MG tablet Take 1 tablet by mouth every 6 (six) hours as needed. 40 tablet 0  . lidocaine-prilocaine (EMLA) cream Apply to affected area once 30 g 3  . nystatin (MYCOSTATIN) 100000 UNIT/ML suspension Take 5 mLs (500,000 Units total) by mouth 4 (four) times daily. 200 mL 0   No current facility-administered medications for this visit.    OBJECTIVE: Filed Vitals:   11/10/15 1618  BP: 102/67  Pulse: 82  Temp: 95.5 F (35.3 C)  Resp: 18     Body mass index is 17.78 kg/(m^2).    ECOG FS:1 - Symptomatic but completely ambulatory  General: Well-developed, well-nourished, no acute distress. Eyes: Pink conjunctiva, anicteric sclera. Lungs: Diminished left breast sounds. Scattered wheezing throughout. Heart: Regular rate and rhythm. No rubs, murmurs, or gallops. Abdomen: Soft, nontender, nondistended. No organomegaly noted, normoactive bowel sounds. Musculoskeletal: No edema, cyanosis, or clubbing. Neuro: Alert, answering all questions appropriately. Cranial nerves grossly intact. Skin: No rashes or petechiae noted. Psych: Normal affect.   LAB RESULTS:  Lab Results  Component Value Date   NA 137 11/21/2015   K 4.9 11/21/2015   CL 103 11/21/2015   CO2 26 11/21/2015   GLUCOSE 115* 11/21/2015   BUN 17 11/21/2015   CREATININE 0.79 11/21/2015   CALCIUM 9.0 11/21/2015   PROT 7.1 11/21/2015   ALBUMIN 2.9* 11/21/2015   AST 19 11/21/2015   ALT 17 11/21/2015   ALKPHOS 102 11/21/2015   BILITOT 0.4 11/21/2015     GFRNONAA >60 11/21/2015   GFRAA >60 11/21/2015    Lab Results  Component Value Date   WBC 14.2* 11/21/2015   NEUTROABS 12.4* 11/21/2015   HGB 11.6* 11/21/2015   HCT 34.7* 11/21/2015   MCV 88.8 11/21/2015   PLT 479* 11/21/2015     STUDIES: Dg Abd 1 View  11/07/2015  CLINICAL DATA:  Kidney stones on right side, lithotripsy on Friday. EXAM: ABDOMEN - 1 VIEW COMPARISON:  10/31/2015 and CT abdomen 10/12/2015. FINDINGS: There are likely tiny calcified stones projecting over the lower poles of both kidneys. A 5 mm calcification is again seen in the right anatomic pelvis and may represent a distal right ureteral stone. Stool is seen throughout the colon. IMPRESSION: 1. Possible residual distal right ureteral stone. 2. Bilateral renal stones. Electronically Signed   By: Rip Harbour  Blietz M.D.   On: 11/07/2015 11:36   Dg Abd 1 View  10/31/2015  CLINICAL DATA:  Right ureteral calculus. EXAM: ABDOMEN - 1 VIEW COMPARISON:  10/26/2015 and 10/17/2015. CT of the abdomen and pelvis on 10/12/2015. FINDINGS: Similar calcification in the right lateral pelvis measuring 5 mm which appears to most likely represent the distal ureteral calculus seen previously. This is in a similar position to the prior radiograph on 10/26/2015. No additional abnormal calcifications are seen with stable phleboliths in the pelvis. Moderate fecal material is present throughout the colon without evidence of overt bowel obstruction. Clips present related to prior cholecystectomy. IMPRESSION: No significant change in appearance of a focal calcification in the right lateral pelvis measuring approximately 5 mm and most likely representing the previously identified distal right ureteral calculus. Electronically Signed   By: Aletta Edouard M.D.   On: 10/31/2015 15:25   Nm Pet Image Initial (pi) Skull Base To Thigh  11/07/2015  CLINICAL DATA:  Subsequent treatment strategy for recurrent squamous cell lung cancer. History COPD. EXAM: NUCLEAR  MEDICINE PET SKULL BASE TO THIGH TECHNIQUE: 12.3 mCi F-18 FDG was injected intravenously. Full-ring PET imaging was performed from the skull base to thigh after the radiotracer. CT data was obtained and used for attenuation correction and anatomic localization. FASTING BLOOD GLUCOSE:  Value: 109 mg/dl COMPARISON:  Chest CT of 10/22/2015. Abdominal pelvic CT of 10/12/2015. FINDINGS: NECK No areas of abnormal hypermetabolism. CHEST High left mediastinal node (called supraclavicular on the prior exam) is not significantly hypermetabolic. Measures 13 mm and a S.U.V. max of 2.4 on image 77/series 3. Similar to the surrounding mediastinal pool. Multi focal hypermetabolism within the compressed left lung. An example area anteriorly measures a S.U.V. max of 5.9 including on image 89/series 3. There is circumferential hypermetabolism with central photopenia about the left infrahilar region and corresponding to the left mainstem bronchus obstruction and surrounding mass. This measures on the order of 4.4 x 5.5 cm and a S.U.V. max of 11.3, including on image 104/series 3. Small left pleural effusion with low-level posterior pleural hypermetabolism, measuring a S.U.V. max of 3.2. ABDOMEN/PELVIS No areas of abnormal hypermetabolism. SKELETON No abnormal marrow activity. CT IMAGES PERFORMED FOR ATTENUATION CORRECTION No cervical adenopathy. Chest findings deferred to recent diagnostic CT. Multivessel coronary artery atherosclerosis. Centrilobular emphysema. Normal adrenal glands. Bilateral renal collecting system calculi. Cholecystectomy. Left hip arthroplasty with resultant beam hardening artifact. Pelvic floor laxity. Hysterectomy. Radiation changes involving thoracic vertebral bodies. IMPRESSION: 1. Multi focal hypermetabolism within the collapsed left lung, including a dominant central left infrahilar peripherally hypermetabolic and likely centrally necrotic mass. 2. No hypermetabolic thoracic nodes, including the enlarged  node detailed on the prior diagnostic CT. 3. No extrathoracic metastatic disease. 4. Left pleural effusion with nonspecific low-level hypermetabolism. 5. Age advanced atherosclerosis, including within the coronary arteries. Electronically Signed   By: Abigail Miyamoto M.D.   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  PLAN:    1. Lung cancer: Patient was initially diagnosed with squamous cell carcinoma the lung in June 2016. PET scan at that time revealed a 2.9 cm left hilar mass with no evidence of lymphadenopathy or distant metastasis. She was stage IIa disease based on one level one lymph node that returned positive for squamous cell on EBUS. She proceeded with concurrent chemotherapy and XRT at [facility]. She initially was started on carboplatinum and Taxol.  Her XRT started on December 09, 2014 and she completed 6600 cGy in 33 fractions. She had a Taxol infusion reaction on cycle 4 and was switched to Abraxane '25mg'$ /m2 for cycle 5. Patient completed 7 weekly cycles of chemotherapy in approximately November 2016. By report, post-treatment scan revealed "stable disease". CT scan results reviewed independently and reported as above. Biopsy confirmed recurrent/persistent squamous cell carcinoma. PET scan results reviewed independently and reported as above. Patient will return to clinic in approximately one week to initiate cycle 1 of 6 of immunotherapy using nivolumab. 2. Multiple sclerosis: Managed at [facility]. By report patient no longer sees her neurologist but continues at pain clinic at [facility]. 3. Post obstructive pneumonia: Patient has completed antibiotics. Monitor closely with patient's near complete left lung collapse. 4. Anemia: Mild, monitor.  Approximately 30 minutes was spent in discussion of which greater than 50% was consultation.  Patient expressed understanding and was in  agreement with this plan. She also understands that She can call clinic at any time with any questions, concerns, or complaints.   Lloyd Huger, MD   11/26/2015 7:53 AM

## 2015-12-01 ENCOUNTER — Inpatient Hospital Stay: Payer: PPO | Admitting: Oncology

## 2015-12-01 ENCOUNTER — Inpatient Hospital Stay: Payer: PPO | Attending: Oncology | Admitting: Oncology

## 2015-12-01 ENCOUNTER — Telehealth: Payer: Self-pay | Admitting: *Deleted

## 2015-12-01 VITALS — BP 89/59 | HR 89 | Temp 96.8°F | Wt 109.6 lb

## 2015-12-01 DIAGNOSIS — R1310 Dysphagia, unspecified: Secondary | ICD-10-CM | POA: Insufficient documentation

## 2015-12-01 DIAGNOSIS — G35 Multiple sclerosis: Secondary | ICD-10-CM | POA: Diagnosis not present

## 2015-12-01 DIAGNOSIS — Z79899 Other long term (current) drug therapy: Secondary | ICD-10-CM | POA: Diagnosis not present

## 2015-12-01 DIAGNOSIS — J189 Pneumonia, unspecified organism: Secondary | ICD-10-CM

## 2015-12-01 DIAGNOSIS — I4891 Unspecified atrial fibrillation: Secondary | ICD-10-CM

## 2015-12-01 DIAGNOSIS — R634 Abnormal weight loss: Secondary | ICD-10-CM | POA: Diagnosis not present

## 2015-12-01 DIAGNOSIS — Z5111 Encounter for antineoplastic chemotherapy: Secondary | ICD-10-CM | POA: Diagnosis not present

## 2015-12-01 DIAGNOSIS — J449 Chronic obstructive pulmonary disease, unspecified: Secondary | ICD-10-CM | POA: Diagnosis not present

## 2015-12-01 DIAGNOSIS — D649 Anemia, unspecified: Secondary | ICD-10-CM | POA: Diagnosis not present

## 2015-12-01 DIAGNOSIS — F419 Anxiety disorder, unspecified: Secondary | ICD-10-CM

## 2015-12-01 DIAGNOSIS — C3490 Malignant neoplasm of unspecified part of unspecified bronchus or lung: Secondary | ICD-10-CM

## 2015-12-01 DIAGNOSIS — F329 Major depressive disorder, single episode, unspecified: Secondary | ICD-10-CM

## 2015-12-01 DIAGNOSIS — C3402 Malignant neoplasm of left main bronchus: Secondary | ICD-10-CM

## 2015-12-01 DIAGNOSIS — Z87442 Personal history of urinary calculi: Secondary | ICD-10-CM

## 2015-12-01 DIAGNOSIS — J439 Emphysema, unspecified: Secondary | ICD-10-CM | POA: Insufficient documentation

## 2015-12-01 DIAGNOSIS — Z87891 Personal history of nicotine dependence: Secondary | ICD-10-CM | POA: Diagnosis not present

## 2015-12-01 DIAGNOSIS — I2510 Atherosclerotic heart disease of native coronary artery without angina pectoris: Secondary | ICD-10-CM | POA: Diagnosis not present

## 2015-12-01 DIAGNOSIS — R630 Anorexia: Secondary | ICD-10-CM | POA: Diagnosis not present

## 2015-12-01 DIAGNOSIS — J90 Pleural effusion, not elsewhere classified: Secondary | ICD-10-CM | POA: Diagnosis not present

## 2015-12-01 MED ORDER — MEGESTROL ACETATE 40 MG PO TABS
40.0000 mg | ORAL_TABLET | Freq: Every day | ORAL | Status: DC
Start: 2015-12-01 — End: 2016-01-03

## 2015-12-01 NOTE — Telephone Encounter (Signed)
Attempted to schedule pt to see [facility] on 12/01/15 in the afternoon. Pt informed scheduling that did not need to see the doctor today and declined appt.

## 2015-12-01 NOTE — Progress Notes (Signed)
States has difficulty swallowing and feels like food is getting stuck. Pain continues in both lungs. Did not get prescription for pain medication since pharmacy would not fill it due to it being too early to fill. Pt states has appt with [facility] pain clinic tomorrow and will discuss pain management with them at that time. Decreased appetite. Daughter present with patient and has questions for doctor today regarding pt's treatment plan for lung cancer.

## 2015-12-01 NOTE — Telephone Encounter (Signed)
-----   Message from Shawnee Knapp, RN sent at 11/30/2015  4:33 PM EDT ----- Regarding: CONCERN Daughter called to state she is very concerned about her mother's condition.  Very worried about her weight loss and plan of care.  Would like to speak with someone or have her mother be seen this week by Dr. Grayland Ormond.

## 2015-12-01 NOTE — Progress Notes (Signed)
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   INTERVAL HISTORY: Patient returns to clinic as an add-on with complaints of difficulty swallowing, poor appetite, and weight loss. Her daughter from [place] is accompanying her visit today. She has weakness and fatigue. She continues to have both dyspnea on exertion and shortness of breath. She has no neurologic complaints. She denies any fevers. She has increased nausea, but denies vomiting, constipation, or diarrhea. She has no urinary complaints. Patient otherwise feels well and offers no further specific complaints.   REVIEW OF SYSTEMS:   Review of Systems  Constitutional: Positive for weight loss and malaise/fatigue. Negative for fever.  Respiratory: Positive for cough and shortness of breath.   Cardiovascular: Positive for chest pain.  Gastrointestinal: Positive for nausea. Negative for abdominal pain, blood in stool and melena.  Musculoskeletal: Positive for back pain and joint pain.  Neurological: Negative.   Psychiatric/Behavioral: The patient is nervous/anxious.     As per HPI. Otherwise, a complete review of systems is negatve.  PAST MEDICAL HISTORY: Past Medical History  Diagnosis Date  . Depression   . Multiple sclerosis ([place])   . Coronary artery disease     said- stent was placed 10 yrs ago from now( 2017) in [place], [place]  . Hematuria   . Atrial fibrillation ([place])   . COPD (chronic obstructive pulmonary disease) ([place])   . Emphysema lung ([place])   . Renal calculi   . Cancer [facility]) 2016    Left lung cancer, radiation, chemo    PAST SURGICAL  HISTORY: Past Surgical History  Procedure Laterality Date  . Stomach ulcer removal  2011  . Total hip arthroplasty  2011  . Extracorporeal shock wave lithotripsy Right 11/03/2015    Procedure: EXTRACORPOREAL SHOCK WAVE LITHOTRIPSY (ESWL);  Surgeon: Hollice Espy, MD;  Location: [facility] ORS;  Service: Urology;  Laterality: Right;    FAMILY HISTORY Family History  Problem Relation Age of Onset  . CAD Mother   . Atrial fibrillation Mother   . Heart attack Neg Hx   . Hypertension Neg Hx   . Stroke Neg Hx   . Kidney disease Neg Hx        ADVANCED DIRECTIVES:    HEALTH MAINTENANCE: Social History  Substance Use Topics  . Smoking status: Former Smoker -- 1.50 packs/day for 45 years    Quit date: 07/26/2015  . Smokeless tobacco: Not on file     Comment: x 2 weeks  . Alcohol Use: 0.0 oz/week    0 Standard drinks or equivalent per week     Colonoscopy:  PAP:  Bone density:  Lipid panel:  Allergies  Allergen Reactions  . Aspirin Other (See Comments)    Pt gets bleeding ulcers when she takes this medication Bleeding ulcers  . Nsaids Other (See Comments)    Other reaction(s): Other (See Comments) Cannot take due to significant peptic ulcer disease. Cannot take due to significant peptic ulcer disease.    Current Outpatient Prescriptions  Medication Sig Dispense Refill  . albuterol (VENTOLIN HFA) 108 (90 Base) MCG/ACT inhaler Inhale 2 puffs into the lungs every 4 (four) hours as needed.    . ALPRAZolam Duanne Moron)  1 MG tablet Take 1 mg by mouth at bedtime as needed for anxiety.    Marland Kitchen amiodarone (PACERONE) 200 MG tablet Take 1 tablet (200 mg total) by mouth 2 (two) times daily. 60 tablet 3  . amitriptyline (ELAVIL) 75 MG tablet Take 75 mg by mouth at bedtime.    Marland Kitchen amphetamine-dextroamphetamine (ADDERALL) 20 MG tablet Take 20 mg by mouth daily. Reported on 09/23/2015    . buPROPion (WELLBUTRIN XL) 150 MG 24 hr tablet Take 1 tablet by mouth daily.    Marland Kitchen docusate sodium (COLACE) 100 MG  capsule Take 1 capsule (100 mg total) by mouth 2 (two) times daily. 60 capsule 0  . fludrocortisone (FLORINEF) 0.1 MG tablet Take 1 tablet (0.1 mg total) by mouth daily. 30 tablet 6  . HYDROcodone-acetaminophen (NORCO) 10-325 MG tablet Take 1 tablet by mouth every 6 (six) hours as needed. 40 tablet 0  . lidocaine-prilocaine (EMLA) cream Apply to affected area once 30 g 3  . metoprolol succinate (TOPROL-XL) 25 MG 24 hr tablet Take 1 tablet by mouth daily.    . mirtazapine (REMERON) 15 MG tablet Take 15 mg by mouth at bedtime.    Marland Kitchen morphine (MS CONTIN) 15 MG 12 hr tablet Take 1 tablet by mouth 2 (two) times daily.    . nicotine (NICODERM CQ - DOSED IN MG/24 HOURS) 21 mg/24hr patch Place 21 mg onto the skin daily.    Marland Kitchen nystatin (MYCOSTATIN) 100000 UNIT/ML suspension Take 5 mLs (500,000 Units total) by mouth 4 (four) times daily. 200 mL 0  . SPIRIVA HANDIHALER 18 MCG inhalation capsule Place 1 capsule into inhaler and inhale daily.    . megestrol (MEGACE) 40 MG tablet Take 1 tablet (40 mg total) by mouth daily. 30 tablet 2   No current facility-administered medications for this visit.    OBJECTIVE: Filed Vitals:   12/01/15 1513  BP: 89/59  Pulse: 89  Temp: 96.8 F (36 C)     Body mass index is 17.16 kg/(m^2).    ECOG FS:1 - Symptomatic but completely ambulatory  General: Well-developed, well-nourished, no acute distress. Eyes: Pink conjunctiva, anicteric sclera. Lungs: Diminished left breast sounds. Scattered wheezing throughout. Heart: Regular rate and rhythm. No rubs, murmurs, or gallops. Abdomen: Soft, nontender, nondistended. No organomegaly noted, normoactive bowel sounds. Musculoskeletal: No edema, cyanosis, or clubbing. Neuro: Alert, answering all questions appropriately. Cranial nerves grossly intact. Skin: No rashes or petechiae noted. Psych: Normal affect.   LAB RESULTS:  Lab Results  Component Value Date   NA 137 11/21/2015   K 4.9 11/21/2015   CL 103 11/21/2015   CO2  26 11/21/2015   GLUCOSE 115* 11/21/2015   BUN 17 11/21/2015   CREATININE 0.79 11/21/2015   CALCIUM 9.0 11/21/2015   PROT 7.1 11/21/2015   ALBUMIN 2.9* 11/21/2015   AST 19 11/21/2015   ALT 17 11/21/2015   ALKPHOS 102 11/21/2015   BILITOT 0.4 11/21/2015   GFRNONAA >60 11/21/2015   GFRAA >60 11/21/2015    Lab Results  Component Value Date   WBC 14.2* 11/21/2015   NEUTROABS 12.4* 11/21/2015   HGB 11.6* 11/21/2015   HCT 34.7* 11/21/2015   MCV 88.8 11/21/2015   PLT 479* 11/21/2015     STUDIES: Dg Abd 1 View  11/07/2015  CLINICAL DATA:  Kidney stones on right side, lithotripsy on Friday. EXAM: ABDOMEN - 1 VIEW COMPARISON:  10/31/2015 and CT abdomen 10/12/2015. FINDINGS: There are likely tiny calcified stones projecting over the lower poles of both kidneys.  A 5 mm calcification is again seen in the right anatomic pelvis and may represent a distal right ureteral stone. Stool is seen throughout the colon. IMPRESSION: 1. Possible residual distal right ureteral stone. 2. Bilateral renal stones. Electronically Signed   By: Lorin Picket M.D.   On: 11/07/2015 11:36   Nm Pet Image Initial (pi) Skull Base To Thigh  11/07/2015  CLINICAL DATA:  Subsequent treatment strategy for recurrent squamous cell lung cancer. History COPD. EXAM: NUCLEAR MEDICINE PET SKULL BASE TO THIGH TECHNIQUE: 12.3 mCi F-18 FDG was injected intravenously. Full-ring PET imaging was performed from the skull base to thigh after the radiotracer. CT data was obtained and used for attenuation correction and anatomic localization. FASTING BLOOD GLUCOSE:  Value: 109 mg/dl COMPARISON:  Chest CT of 10/22/2015. Abdominal pelvic CT of 10/12/2015. FINDINGS: NECK No areas of abnormal hypermetabolism. CHEST High left mediastinal node (called supraclavicular on the prior exam) is not significantly hypermetabolic. Measures 13 mm and a S.U.V. max of 2.4 on image 77/series 3. Similar to the surrounding mediastinal pool. Multi focal  hypermetabolism within the compressed left lung. An example area anteriorly measures a S.U.V. max of 5.9 including on image 89/series 3. There is circumferential hypermetabolism with central photopenia about the left infrahilar region and corresponding to the left mainstem bronchus obstruction and surrounding mass. This measures on the order of 4.4 x 5.5 cm and a S.U.V. max of 11.3, including on image 104/series 3. Small left pleural effusion with low-level posterior pleural hypermetabolism, measuring a S.U.V. max of 3.2. ABDOMEN/PELVIS No areas of abnormal hypermetabolism. SKELETON No abnormal marrow activity. CT IMAGES PERFORMED FOR ATTENUATION CORRECTION No cervical adenopathy. Chest findings deferred to recent diagnostic CT. Multivessel coronary artery atherosclerosis. Centrilobular emphysema. Normal adrenal glands. Bilateral renal collecting system calculi. Cholecystectomy. Left hip arthroplasty with resultant beam hardening artifact. Pelvic floor laxity. Hysterectomy. Radiation changes involving thoracic vertebral bodies. IMPRESSION: 1. Multi focal hypermetabolism within the collapsed left lung, including a dominant central left infrahilar peripherally hypermetabolic and likely centrally necrotic mass. 2. No hypermetabolic thoracic nodes, including the enlarged node detailed on the prior diagnostic CT. 3. No extrathoracic metastatic disease. 4. Left pleural effusion with nonspecific low-level hypermetabolism. 5. Age advanced atherosclerosis, including within the coronary arteries. Electronically Signed   By: Abigail Miyamoto M.D.   On: 11/07/2015 12:01    ONCOLOGIC HISTORY: Patient was initially diagnosed with squamous cell carcinoma the lung in June 2016. PET scan at that time revealed a 2.9 cm left hilar mass with no evidence of lymphadenopathy or distant metastasis. She was stage IIa disease based on one level one lymph node that returned positive for squamous cell on EBUS. She proceeded with concurrent  chemotherapy and XRT at [facility]. She initially was started on carboplatinum and Taxol. Her XRT started on December 09, 2014 and she completed 6600 cGy in 33 fractions. She had a Taxol infusion reaction on cycle 4 and was switched to Abraxane '25mg'$ /m2 for cycle 5. Patient completed 7 weekly cycles of chemotherapy in approximately November 2016. By report, post-treatment scan revealed "stable disease".    ASSESSMENT: Squamous cell carcinoma of the lung (stage IIa, T1b,N1,M0), now with either recurrent or persistent disease.  PLAN:    1. Squamous cell carcinoma of the hilus of left lung:  PET scan results reviewed independently and reported as above. Biopsy confirmed recurrent/persistent squamous cell carcinoma. Patient received cycle 1 of 6 of salvage nivolumab last week and tolerated her treatment well. Return to clinic in 1 week  for consideration of cycle 2. Will reimage at the conclusion of cycle 6.  2. Multiple sclerosis: Managed at [facility]. By report patient no longer sees her neurologist but continues at pain clinic at [facility].  3. Postoperative pneumonia: Continue current antibiotics.  4. Anemia: Mild, monitor. 5. Pain: Patient has been instructed that any adjustments her new prescriptions for her pain should be obtained by her pain clinic at [facility]. This was ongoing prior to diagnosis cancer and therefore likely unrelated. Patient states she has an appointment tomorrow. 6. Poor appetite: Patient was given a prescription for Megace today.  Approximately 30 minutes was spent in discussion of which greater than 50% was consultation.  Patient expressed understanding and was in agreement with this plan. She also understands that She can call clinic at any time with any questions, concerns, or complaints.   Lloyd Huger, MD   12/01/2015 4:37 PM

## 2015-12-05 ENCOUNTER — Inpatient Hospital Stay: Payer: PPO

## 2015-12-05 ENCOUNTER — Inpatient Hospital Stay (HOSPITAL_BASED_OUTPATIENT_CLINIC_OR_DEPARTMENT_OTHER): Payer: PPO | Admitting: Oncology

## 2015-12-05 VITALS — BP 94/59 | Temp 97.1°F | Wt 109.5 lb

## 2015-12-05 DIAGNOSIS — G35 Multiple sclerosis: Secondary | ICD-10-CM | POA: Diagnosis not present

## 2015-12-05 DIAGNOSIS — D649 Anemia, unspecified: Secondary | ICD-10-CM | POA: Diagnosis not present

## 2015-12-05 DIAGNOSIS — C3402 Malignant neoplasm of left main bronchus: Secondary | ICD-10-CM

## 2015-12-05 DIAGNOSIS — Z5111 Encounter for antineoplastic chemotherapy: Secondary | ICD-10-CM | POA: Diagnosis not present

## 2015-12-05 DIAGNOSIS — F419 Anxiety disorder, unspecified: Secondary | ICD-10-CM

## 2015-12-05 DIAGNOSIS — J449 Chronic obstructive pulmonary disease, unspecified: Secondary | ICD-10-CM

## 2015-12-05 DIAGNOSIS — Z87442 Personal history of urinary calculi: Secondary | ICD-10-CM

## 2015-12-05 DIAGNOSIS — J189 Pneumonia, unspecified organism: Secondary | ICD-10-CM

## 2015-12-05 DIAGNOSIS — I4891 Unspecified atrial fibrillation: Secondary | ICD-10-CM

## 2015-12-05 DIAGNOSIS — R630 Anorexia: Secondary | ICD-10-CM

## 2015-12-05 DIAGNOSIS — F329 Major depressive disorder, single episode, unspecified: Secondary | ICD-10-CM

## 2015-12-05 DIAGNOSIS — I2510 Atherosclerotic heart disease of native coronary artery without angina pectoris: Secondary | ICD-10-CM

## 2015-12-05 DIAGNOSIS — J439 Emphysema, unspecified: Secondary | ICD-10-CM

## 2015-12-05 DIAGNOSIS — R634 Abnormal weight loss: Secondary | ICD-10-CM

## 2015-12-05 DIAGNOSIS — J90 Pleural effusion, not elsewhere classified: Secondary | ICD-10-CM

## 2015-12-05 DIAGNOSIS — R1310 Dysphagia, unspecified: Secondary | ICD-10-CM

## 2015-12-05 LAB — COMPREHENSIVE METABOLIC PANEL
ALK PHOS: 103 U/L (ref 38–126)
ALT: 20 U/L (ref 14–54)
ANION GAP: 7 (ref 5–15)
AST: 22 U/L (ref 15–41)
Albumin: 3 g/dL — ABNORMAL LOW (ref 3.5–5.0)
BILIRUBIN TOTAL: 0.6 mg/dL (ref 0.3–1.2)
BUN: 12 mg/dL (ref 6–20)
CALCIUM: 8.8 mg/dL — AB (ref 8.9–10.3)
CO2: 28 mmol/L (ref 22–32)
CREATININE: 0.59 mg/dL (ref 0.44–1.00)
Chloride: 101 mmol/L (ref 101–111)
Glucose, Bld: 118 mg/dL — ABNORMAL HIGH (ref 65–99)
Potassium: 3.7 mmol/L (ref 3.5–5.1)
Sodium: 136 mmol/L (ref 135–145)
TOTAL PROTEIN: 7.2 g/dL (ref 6.5–8.1)

## 2015-12-05 LAB — CBC WITH DIFFERENTIAL/PLATELET
Basophils Absolute: 0.1 10*3/uL (ref 0–0.1)
Basophils Relative: 1 %
EOS ABS: 0.2 10*3/uL (ref 0–0.7)
Eosinophils Relative: 1 %
HEMATOCRIT: 33 % — AB (ref 35.0–47.0)
HEMOGLOBIN: 10.8 g/dL — AB (ref 12.0–16.0)
Lymphocytes Relative: 5 %
Lymphs Abs: 0.6 10*3/uL — ABNORMAL LOW (ref 1.0–3.6)
MCH: 28.5 pg (ref 26.0–34.0)
MCHC: 32.8 g/dL (ref 32.0–36.0)
MCV: 86.9 fL (ref 80.0–100.0)
Monocytes Absolute: 0.8 10*3/uL (ref 0.2–0.9)
Monocytes Relative: 7 %
Neutro Abs: 9.7 10*3/uL — ABNORMAL HIGH (ref 1.4–6.5)
Neutrophils Relative %: 86 %
Platelets: 518 10*3/uL — ABNORMAL HIGH (ref 150–440)
RBC: 3.79 MIL/uL — ABNORMAL LOW (ref 3.80–5.20)
RDW: 15.8 % — ABNORMAL HIGH (ref 11.5–14.5)
WBC: 11.4 10*3/uL — AB (ref 3.6–11.0)

## 2015-12-05 MED ORDER — SODIUM CHLORIDE 0.9 % IV SOLN
240.0000 mg | Freq: Once | INTRAVENOUS | Status: AC
  Administered 2015-12-05: 240 mg via INTRAVENOUS
  Filled 2015-12-05: qty 20

## 2015-12-05 MED ORDER — SODIUM CHLORIDE 0.9 % IV SOLN
Freq: Once | INTRAVENOUS | Status: AC
  Administered 2015-12-05: 14:00:00 via INTRAVENOUS
  Filled 2015-12-05: qty 1000

## 2015-12-05 NOTE — Progress Notes (Signed)
Patient ambulates without assistance, brought to exam room 8, accompanied by her sister.  Patient denies pain or discomfort. BP 94/59, vitals documented.  Medication record updated information provided by patient

## 2015-12-05 NOTE — Progress Notes (Signed)
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  INTERVAL HISTORY: Patient returns to clinic for consideration of cycle 2 of nivolumab. She continues to have a poor appetite, but her weight is stable. She has weakness and fatigue. She continues to have both dyspnea on exertion and shortness of breath. She has no neurologic complaints. She denies any fevers. She has increased nausea, but denies vomiting, constipation, or diarrhea. She has no urinary complaints. Patient otherwise feels well and offers no further specific complaints.   REVIEW OF SYSTEMS:   Review of Systems  Constitutional: Positive for malaise/fatigue. Negative for fever and weight loss.  Respiratory: Positive for cough and shortness of breath.   Cardiovascular: Positive for chest pain.  Gastrointestinal: Positive for nausea. Negative for abdominal pain, blood in stool and melena.  Musculoskeletal: Positive for back pain and joint pain.  Neurological: Positive for weakness.  Psychiatric/Behavioral: The patient is nervous/anxious.     As per HPI. Otherwise, a complete review of systems is negatve.  PAST MEDICAL HISTORY: Past Medical History  Diagnosis Date  . Depression   . Multiple sclerosis ([place])   . Coronary artery disease     said- stent was placed 10 yrs ago from now( 2017) in [place], [place]  . Hematuria   . Atrial fibrillation ([place])   . COPD (chronic obstructive pulmonary disease) ([place])   . Emphysema lung ([place])   . Renal calculi   . Cancer [facility]) 2016    Left lung cancer, radiation, chemo    PAST SURGICAL HISTORY: Past Surgical History    Procedure Laterality Date  . Stomach ulcer removal  2011  . Total hip arthroplasty  2011  . Extracorporeal shock wave lithotripsy Right 11/03/2015    Procedure: EXTRACORPOREAL SHOCK WAVE LITHOTRIPSY (ESWL);  Surgeon: Hollice Espy, MD;  Location: [facility] ORS;  Service: Urology;  Laterality: Right;    FAMILY HISTORY Family History  Problem Relation Age of Onset  . CAD Mother   . Atrial fibrillation Mother   . Heart attack Neg Hx   . Hypertension Neg Hx   . Stroke Neg Hx   . Kidney disease Neg Hx        ADVANCED DIRECTIVES:    HEALTH MAINTENANCE: Social History  Substance Use Topics  . Smoking status: Former Smoker -- 1.50 packs/day for 45 years    Quit date: 07/26/2015  . Smokeless tobacco: Not on file     Comment: x 2 weeks  . Alcohol Use: 0.0 oz/week    0 Standard drinks or equivalent per week     Colonoscopy:  PAP:  Bone density:  Lipid panel:  Allergies  Allergen Reactions  . Aspirin Other (See Comments)    Pt gets bleeding ulcers when she takes this medication Bleeding ulcers  . Nsaids Other (See Comments)    Other reaction(s): Other (See Comments) Cannot take due to significant peptic ulcer disease. Cannot take due to significant peptic ulcer disease.    Current Outpatient Prescriptions  Medication Sig Dispense Refill  . albuterol (VENTOLIN HFA) 108 (90 Base) MCG/ACT inhaler Inhale 2 puffs into the lungs every 4 (four) hours as needed.    Marland Kitchen amiodarone (PACERONE) 200 MG  tablet Take 1 tablet (200 mg total) by mouth 2 (two) times daily. 60 tablet 3  . amitriptyline (ELAVIL) 75 MG tablet Take 75 mg by mouth at bedtime.    Marland Kitchen amphetamine-dextroamphetamine (ADDERALL) 20 MG tablet Take 20 mg by mouth daily. Reported on 09/23/2015    . buPROPion (WELLBUTRIN XL) 150 MG 24 hr tablet Take 1 tablet by mouth daily.    Marland Kitchen docusate sodium (COLACE) 100 MG capsule Take 1 capsule (100 mg total) by mouth 2 (two) times daily. 60 capsule 0  . fludrocortisone (FLORINEF) 0.1 MG  tablet Take 1 tablet (0.1 mg total) by mouth daily. 30 tablet 6  . HYDROcodone-acetaminophen (NORCO) 10-325 MG tablet Take 1 tablet by mouth every 6 (six) hours as needed. 40 tablet 0  . megestrol (MEGACE) 40 MG tablet Take 1 tablet (40 mg total) by mouth daily. 30 tablet 2  . metoprolol succinate (TOPROL-XL) 25 MG 24 hr tablet Take 1 tablet by mouth daily.    . mirtazapine (REMERON) 15 MG tablet Take 15 mg by mouth at bedtime.    Marland Kitchen morphine (MS CONTIN) 15 MG 12 hr tablet Take 1 tablet by mouth 2 (two) times daily.    . nicotine (NICODERM CQ - DOSED IN MG/24 HOURS) 21 mg/24hr patch Place 21 mg onto the skin daily.    Marland Kitchen SPIRIVA HANDIHALER 18 MCG inhalation capsule Place 1 capsule into inhaler and inhale daily.    Marland Kitchen ALPRAZolam (XANAX) 1 MG tablet Take 1 mg by mouth at bedtime as needed for anxiety. Reported on 12/05/2015    . lidocaine-prilocaine (EMLA) cream Apply to affected area once (Patient not taking: Reported on 12/05/2015) 30 g 3   No current facility-administered medications for this visit.   Facility-Administered Medications Ordered in Other Visits  Medication Dose Route Frequency Provider Last Rate Last Dose  . 0.9 %  sodium chloride infusion   Intravenous Once Lloyd Huger, MD      . nivolumab (OPDIVO) 240 mg in sodium chloride 0.9 % 100 mL chemo infusion  240 mg Intravenous Once Lloyd Huger, MD        OBJECTIVE: Filed Vitals:   12/05/15 1349  BP: 94/59  Temp: 97.1 F (36.2 C)     Body mass index is 17.14 kg/(m^2).    ECOG FS:1 - Symptomatic but completely ambulatory  General: Well-developed, well-nourished, no acute distress. Eyes: Pink conjunctiva, anicteric sclera. Lungs: Diminished left breast sounds. Scattered wheezing throughout. Heart: Regular rate and rhythm. No rubs, murmurs, or gallops. Abdomen: Soft, nontender, nondistended. No organomegaly noted, normoactive bowel sounds. Musculoskeletal: No edema, cyanosis, or clubbing. Neuro: Alert, answering all  questions appropriately. Cranial nerves grossly intact. Skin: No rashes or petechiae noted. Psych: Normal affect.   LAB RESULTS:  Lab Results  Component Value Date   NA 136 12/05/2015   K 3.7 12/05/2015   CL 101 12/05/2015   CO2 28 12/05/2015   GLUCOSE 118* 12/05/2015   BUN 12 12/05/2015   CREATININE 0.59 12/05/2015   CALCIUM 8.8* 12/05/2015   PROT 7.2 12/05/2015   ALBUMIN 3.0* 12/05/2015   AST 22 12/05/2015   ALT 20 12/05/2015   ALKPHOS 103 12/05/2015   BILITOT 0.6 12/05/2015   GFRNONAA >60 12/05/2015   GFRAA >60 12/05/2015    Lab Results  Component Value Date   WBC 11.4* 12/05/2015   NEUTROABS 9.7* 12/05/2015   HGB 10.8* 12/05/2015   HCT 33.0* 12/05/2015   MCV 86.9 12/05/2015   PLT 518* 12/05/2015  STUDIES: Dg Abd 1 View  11/07/2015  CLINICAL DATA:  Kidney stones on right side, lithotripsy on Friday. EXAM: ABDOMEN - 1 VIEW COMPARISON:  10/31/2015 and CT abdomen 10/12/2015. FINDINGS: There are likely tiny calcified stones projecting over the lower poles of both kidneys. A 5 mm calcification is again seen in the right anatomic pelvis and may represent a distal right ureteral stone. Stool is seen throughout the colon. IMPRESSION: 1. Possible residual distal right ureteral stone. 2. Bilateral renal stones. Electronically Signed   By: Lorin Picket M.D.   On: 11/07/2015 11:36   Nm Pet Image Initial (pi) Skull Base To Thigh  11/07/2015  CLINICAL DATA:  Subsequent treatment strategy for recurrent squamous cell lung cancer. History COPD. EXAM: NUCLEAR MEDICINE PET SKULL BASE TO THIGH TECHNIQUE: 12.3 mCi F-18 FDG was injected intravenously. Full-ring PET imaging was performed from the skull base to thigh after the radiotracer. CT data was obtained and used for attenuation correction and anatomic localization. FASTING BLOOD GLUCOSE:  Value: 109 mg/dl COMPARISON:  Chest CT of 10/22/2015. Abdominal pelvic CT of 10/12/2015. FINDINGS: NECK No areas of abnormal hypermetabolism.  CHEST High left mediastinal node (called supraclavicular on the prior exam) is not significantly hypermetabolic. Measures 13 mm and a S.U.V. max of 2.4 on image 77/series 3. Similar to the surrounding mediastinal pool. Multi focal hypermetabolism within the compressed left lung. An example area anteriorly measures a S.U.V. max of 5.9 including on image 89/series 3. There is circumferential hypermetabolism with central photopenia about the left infrahilar region and corresponding to the left mainstem bronchus obstruction and surrounding mass. This measures on the order of 4.4 x 5.5 cm and a S.U.V. max of 11.3, including on image 104/series 3. Small left pleural effusion with low-level posterior pleural hypermetabolism, measuring a S.U.V. max of 3.2. ABDOMEN/PELVIS No areas of abnormal hypermetabolism. SKELETON No abnormal marrow activity. CT IMAGES PERFORMED FOR ATTENUATION CORRECTION No cervical adenopathy. Chest findings deferred to recent diagnostic CT. Multivessel coronary artery atherosclerosis. Centrilobular emphysema. Normal adrenal glands. Bilateral renal collecting system calculi. Cholecystectomy. Left hip arthroplasty with resultant beam hardening artifact. Pelvic floor laxity. Hysterectomy. Radiation changes involving thoracic vertebral bodies. IMPRESSION: 1. Multi focal hypermetabolism within the collapsed left lung, including a dominant central left infrahilar peripherally hypermetabolic and likely centrally necrotic mass. 2. No hypermetabolic thoracic nodes, including the enlarged node detailed on the prior diagnostic CT. 3. No extrathoracic metastatic disease. 4. Left pleural effusion with nonspecific low-level hypermetabolism. 5. Age advanced atherosclerosis, including within the coronary arteries. Electronically Signed   By: Abigail Miyamoto M.D.   On: 11/07/2015 12:01    ONCOLOGIC HISTORY: Patient was initially diagnosed with squamous cell carcinoma the lung in June 2016. PET scan at that time  revealed a 2.9 cm left hilar mass with no evidence of lymphadenopathy or distant metastasis. She was stage IIa disease based on one level one lymph node that returned positive for squamous cell on EBUS. She proceeded with concurrent chemotherapy and XRT at [facility]. She initially was started on carboplatinum and Taxol. Her XRT started on December 09, 2014 and she completed 6600 cGy in 33 fractions. She had a Taxol infusion reaction on cycle 4 and was switched to Abraxane '25mg'$ /m2 for cycle 5. Patient completed 7 weekly cycles of chemotherapy in approximately November 2016. By report, post-treatment scan revealed "stable disease".    ASSESSMENT: Squamous cell carcinoma of the lung (stage IIa, T1b,N1,M0), now with either recurrent or persistent disease.  PLAN:    1. Squamous  cell carcinoma of the hilus of left lung:  PET scan results reviewed independently and reported as above. Biopsy confirmed recurrent/persistent squamous cell carcinoma. Proceed with cycle 2 of 6 of salvage nivolumab today. Return to clinic in 2 weeks for consideration of cycle 3. Will reimage at the conclusion of cycle 6.  2. Multiple sclerosis: Managed at [facility]. By report patient no longer sees her neurologist but continues at pain clinic at [facility].  3. Anemia: Mild, monitor. 4. Pain: Patient had an appointment at [facility] pain clinic last week and has agreed to continue receiving her narcotics to them. Patient should not be given prescriptions from this clinic.  5. Poor appetite: Patient not yet picked up her prescription for Megace.  Patient expressed understanding and was in agreement with this plan. She also understands that She can call clinic at any time with any questions, concerns, or complaints.   Lloyd Huger, MD   12/05/2015 2:41 PM

## 2015-12-14 ENCOUNTER — Telehealth: Payer: Self-pay | Admitting: Internal Medicine

## 2015-12-14 DIAGNOSIS — C3402 Malignant neoplasm of left main bronchus: Secondary | ICD-10-CM

## 2015-12-14 DIAGNOSIS — R1310 Dysphagia, unspecified: Secondary | ICD-10-CM

## 2015-12-14 NOTE — Telephone Encounter (Signed)
[place] to daughter that pt have difficulty swallowing; needs DG esophagus/ eval by NP-Trish / possible IVF- tomorrow/july 20th. please set this up. Thx

## 2015-12-15 ENCOUNTER — Inpatient Hospital Stay: Payer: PPO

## 2015-12-15 ENCOUNTER — Inpatient Hospital Stay (HOSPITAL_BASED_OUTPATIENT_CLINIC_OR_DEPARTMENT_OTHER): Payer: PPO | Admitting: Oncology

## 2015-12-15 ENCOUNTER — Other Ambulatory Visit: Payer: Self-pay | Admitting: Oncology

## 2015-12-15 DIAGNOSIS — G35 Multiple sclerosis: Secondary | ICD-10-CM

## 2015-12-15 DIAGNOSIS — Z87891 Personal history of nicotine dependence: Secondary | ICD-10-CM

## 2015-12-15 DIAGNOSIS — J189 Pneumonia, unspecified organism: Secondary | ICD-10-CM

## 2015-12-15 DIAGNOSIS — R1310 Dysphagia, unspecified: Secondary | ICD-10-CM | POA: Diagnosis not present

## 2015-12-15 DIAGNOSIS — Z5111 Encounter for antineoplastic chemotherapy: Secondary | ICD-10-CM | POA: Diagnosis not present

## 2015-12-15 DIAGNOSIS — C3402 Malignant neoplasm of left main bronchus: Secondary | ICD-10-CM

## 2015-12-15 DIAGNOSIS — Z79899 Other long term (current) drug therapy: Secondary | ICD-10-CM

## 2015-12-15 DIAGNOSIS — F329 Major depressive disorder, single episode, unspecified: Secondary | ICD-10-CM

## 2015-12-15 DIAGNOSIS — Z87442 Personal history of urinary calculi: Secondary | ICD-10-CM

## 2015-12-15 DIAGNOSIS — J449 Chronic obstructive pulmonary disease, unspecified: Secondary | ICD-10-CM

## 2015-12-15 DIAGNOSIS — J439 Emphysema, unspecified: Secondary | ICD-10-CM

## 2015-12-15 DIAGNOSIS — I4891 Unspecified atrial fibrillation: Secondary | ICD-10-CM

## 2015-12-15 DIAGNOSIS — J90 Pleural effusion, not elsewhere classified: Secondary | ICD-10-CM

## 2015-12-15 DIAGNOSIS — F419 Anxiety disorder, unspecified: Secondary | ICD-10-CM

## 2015-12-15 DIAGNOSIS — R634 Abnormal weight loss: Secondary | ICD-10-CM

## 2015-12-15 DIAGNOSIS — D649 Anemia, unspecified: Secondary | ICD-10-CM

## 2015-12-15 DIAGNOSIS — R630 Anorexia: Secondary | ICD-10-CM

## 2015-12-15 DIAGNOSIS — I2510 Atherosclerotic heart disease of native coronary artery without angina pectoris: Secondary | ICD-10-CM

## 2015-12-15 LAB — COMPREHENSIVE METABOLIC PANEL
ALK PHOS: 93 U/L (ref 38–126)
ALT: 36 U/L (ref 14–54)
ANION GAP: 8 (ref 5–15)
AST: 38 U/L (ref 15–41)
Albumin: 3.1 g/dL — ABNORMAL LOW (ref 3.5–5.0)
BILIRUBIN TOTAL: 0.6 mg/dL (ref 0.3–1.2)
BUN: 13 mg/dL (ref 6–20)
CALCIUM: 9.1 mg/dL (ref 8.9–10.3)
CO2: 26 mmol/L (ref 22–32)
Chloride: 104 mmol/L (ref 101–111)
Creatinine, Ser: 0.66 mg/dL (ref 0.44–1.00)
GLUCOSE: 101 mg/dL — AB (ref 65–99)
POTASSIUM: 4.7 mmol/L (ref 3.5–5.1)
Sodium: 138 mmol/L (ref 135–145)
TOTAL PROTEIN: 7.3 g/dL (ref 6.5–8.1)

## 2015-12-15 LAB — CBC WITH DIFFERENTIAL/PLATELET
BASOS PCT: 1 %
Basophils Absolute: 0.1 10*3/uL (ref 0–0.1)
Eosinophils Absolute: 0.2 10*3/uL (ref 0–0.7)
Eosinophils Relative: 2 %
HEMATOCRIT: 31.3 % — AB (ref 35.0–47.0)
HEMOGLOBIN: 10.1 g/dL — AB (ref 12.0–16.0)
LYMPHS ABS: 0.7 10*3/uL — AB (ref 1.0–3.6)
LYMPHS PCT: 6 %
MCH: 27.6 pg (ref 26.0–34.0)
MCHC: 32.4 g/dL (ref 32.0–36.0)
MCV: 85.3 fL (ref 80.0–100.0)
MONO ABS: 0.9 10*3/uL (ref 0.2–0.9)
MONOS PCT: 8 %
NEUTROS ABS: 9.6 10*3/uL — AB (ref 1.4–6.5)
NEUTROS PCT: 83 %
Platelets: 495 10*3/uL — ABNORMAL HIGH (ref 150–440)
RBC: 3.67 MIL/uL — ABNORMAL LOW (ref 3.80–5.20)
RDW: 17 % — AB (ref 11.5–14.5)
WBC: 11.5 10*3/uL — ABNORMAL HIGH (ref 3.6–11.0)

## 2015-12-15 NOTE — Telephone Encounter (Signed)
Pt on sch to see Trish today and have possible IV fluids. msg sent to cancer center sch. To arrange DG esophagus.

## 2015-12-15 NOTE — Addendum Note (Signed)
Addended by: Renita Papa R on: 12/15/2015 11:04 AM   Modules accepted: Orders

## 2015-12-15 NOTE — Progress Notes (Signed)
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  INTERVAL HISTORY: Patient returns to clinic as an add on for difficulty swallowing. In the past week she it has become more difficult for her to get and keep food down. She get a lot of air and the food seems like it gets stuck about halfway down and then has to come back up. She has learned to adjust her posture while swallowing which seems to help. She is concerned about weight loss. She has weakness and fatigue. She continues to have both dyspnea on exertion and shortness of breath. She has no neurologic complaints. She denies any fevers. She denies constipation or diarrhea. She has no urinary complaints. Patient otherwise feels well and offers no further specific complaints.   REVIEW OF SYSTEMS:   Review of Systems  Constitutional: Positive for malaise/fatigue. Negative for fever and weight loss.  HENT:       Difficulty swallowing  Respiratory: Positive for shortness of breath. Negative for cough.   Cardiovascular: Negative for chest pain.  Gastrointestinal: Positive for nausea. Negative for abdominal pain, blood in stool and melena.  Musculoskeletal: Positive for back pain and joint pain.  Neurological: Positive for weakness.  Psychiatric/Behavioral: The patient is nervous/anxious.     As per HPI. Otherwise, a complete review of systems is negatve.  PAST MEDICAL HISTORY: Past Medical History  Diagnosis Date  . Depression   . Multiple sclerosis ([place])   . Coronary artery disease     said- stent was placed 10 yrs ago from now( 2017) in [place]  . Hematuria   .  Atrial fibrillation ([place])   . COPD (chronic obstructive pulmonary disease) ([place])   . Emphysema lung ([place])   . Renal calculi   . Cancer [facility]) 2016    Left lung cancer, radiation, chemo    PAST SURGICAL HISTORY: Past Surgical History  Procedure Laterality Date  . Stomach ulcer removal  2011  . Total hip arthroplasty  2011  . Extracorporeal shock wave lithotripsy Right 11/03/2015    Procedure: EXTRACORPOREAL SHOCK WAVE LITHOTRIPSY (ESWL);  Surgeon: Hollice Espy, MD;  Location: [facility] ORS;  Service: Urology;  Laterality: Right;    FAMILY HISTORY Family History  Problem Relation Age of Onset  . CAD Mother   . Atrial fibrillation Mother   . Heart attack Neg Hx   . Hypertension Neg Hx   . Stroke Neg Hx   . Kidney disease Neg Hx        ADVANCED DIRECTIVES:    HEALTH MAINTENANCE: Social History  Substance Use Topics  . Smoking status: Former Smoker -- 1.50 packs/day for 45 years    Quit date: 07/26/2015  . Smokeless tobacco: Not on file     Comment: x 2 weeks  . Alcohol Use: 0.0 oz/week    0 Standard drinks or equivalent per week     Allergies  Allergen Reactions  . Aspirin Other (See Comments)    Pt gets bleeding ulcers when she takes this medication Bleeding ulcers  . Nsaids Other (See Comments)    Other reaction(s): Other (See Comments) Cannot take due to significant peptic ulcer disease. Cannot take due to significant  peptic ulcer disease.    Current Outpatient Prescriptions  Medication Sig Dispense Refill  . albuterol (VENTOLIN HFA) 108 (90 Base) MCG/ACT inhaler Inhale 2 puffs into the lungs every 4 (four) hours as needed.    . ALPRAZolam (XANAX) 1 MG tablet Take 1 mg by mouth at bedtime as needed for anxiety. Reported on 12/05/2015    . amiodarone (PACERONE) 200 MG tablet Take 1 tablet (200 mg total) by mouth 2 (two) times daily. 60 tablet 3  . amitriptyline (ELAVIL) 75 MG tablet Take 75 mg by mouth at bedtime.    Marland Kitchen amphetamine-dextroamphetamine (ADDERALL) 20 MG  tablet Take 20 mg by mouth daily. Reported on 09/23/2015    . buPROPion (WELLBUTRIN XL) 150 MG 24 hr tablet Take 1 tablet by mouth daily.    Marland Kitchen docusate sodium (COLACE) 100 MG capsule Take 1 capsule (100 mg total) by mouth 2 (two) times daily. 60 capsule 0  . fludrocortisone (FLORINEF) 0.1 MG tablet Take 1 tablet (0.1 mg total) by mouth daily. 30 tablet 6  . HYDROcodone-acetaminophen (NORCO) 10-325 MG tablet Take 1 tablet by mouth every 6 (six) hours as needed. 40 tablet 0  . lidocaine-prilocaine (EMLA) cream Apply to affected area once 30 g 3  . megestrol (MEGACE) 40 MG tablet Take 1 tablet (40 mg total) by mouth daily. 30 tablet 2  . metoprolol succinate (TOPROL-XL) 25 MG 24 hr tablet Take 1 tablet by mouth daily.    . mirtazapine (REMERON) 15 MG tablet Take 15 mg by mouth at bedtime.    Marland Kitchen morphine (MS CONTIN) 15 MG 12 hr tablet Take 1 tablet by mouth 2 (two) times daily.    . nicotine (NICODERM CQ - DOSED IN MG/24 HOURS) 21 mg/24hr patch Place 21 mg onto the skin daily.    Marland Kitchen SPIRIVA HANDIHALER 18 MCG inhalation capsule Place 1 capsule into inhaler and inhale daily.     No current facility-administered medications for this visit.    OBJECTIVE: Filed Vitals:   12/15/15 1420  BP: 111/70  Pulse: 83  Temp: 97 F (36.1 C)     Body mass index is 16.86 kg/(m^2).    ECOG FS:1 - Symptomatic but completely ambulatory  General: Well-developed, well-nourished, no acute distress. Eyes: Pink conjunctiva, anicteric sclera. Lungs: Diminished left breast sounds. Scattered wheezing throughout. Heart: Regular rate and rhythm. No rubs, murmurs, or gallops. Abdomen: Soft, nontender, nondistended. No organomegaly noted, normoactive bowel sounds. Musculoskeletal: No edema, cyanosis, or clubbing. Neuro: Alert, answering all questions appropriately. Cranial nerves grossly intact. Skin: No rashes or petechiae noted. Psych: Normal affect.   LAB RESULTS:  Lab Results  Component Value Date   NA 138  12/15/2015   K 4.7 12/15/2015   CL 104 12/15/2015   CO2 26 12/15/2015   GLUCOSE 101* 12/15/2015   BUN 13 12/15/2015   CREATININE 0.66 12/15/2015   CALCIUM 9.1 12/15/2015   PROT 7.3 12/15/2015   ALBUMIN 3.1* 12/15/2015   AST 38 12/15/2015   ALT 36 12/15/2015   ALKPHOS 93 12/15/2015   BILITOT 0.6 12/15/2015   GFRNONAA >60 12/15/2015   GFRAA >60 12/15/2015    Lab Results  Component Value Date   WBC 11.5* 12/15/2015   NEUTROABS 9.6* 12/15/2015   HGB 10.1* 12/15/2015   HCT 31.3* 12/15/2015   MCV 85.3 12/15/2015   PLT 495* 12/15/2015     STUDIES: No results found.  ONCOLOGIC HISTORY: Patient was initially diagnosed with squamous cell carcinoma the lung in June 2016. PET scan at  that time revealed a 2.9 cm left hilar mass with no evidence of lymphadenopathy or distant metastasis. She was stage IIa disease based on one level one lymph node that returned positive for squamous cell on EBUS. She proceeded with concurrent chemotherapy and XRT at [facility]. She initially was started on carboplatinum and Taxol. Her XRT started on December 09, 2014 and she completed 6600 cGy in 33 fractions. She had a Taxol infusion reaction on cycle 4 and was switched to Abraxane '25mg'$ /m2 for cycle 5. Patient completed 7 weekly cycles of chemotherapy in approximately November 2016. By report, post-treatment scan revealed "stable disease".    ASSESSMENT: Squamous cell carcinoma of the lung (stage IIa, T1b,N1,M0), now with either recurrent or persistent disease.  PLAN:    1. Squamous cell carcinoma of the hilus of left lung:   Biopsy confirmed recurrent/persistent squamous cell carcinoma. Proceed with cycle 2 of 6 of salvage nivolumab today. Keep appointment July 25th for treatment and scan results. Will reimage at the conclusion of cycle 6.  2. Multiple sclerosis: Managed at [facility]. By report patient no longer sees her neurologist but continues at pain clinic at [facility].  3. Anemia: Mild, monitor. 4.  Pain: Patient had an appointment at [facility] pain clinic last week and has agreed to continue receiving her narcotics to them. Patient should not be given prescriptions from this clinic.  5. Poor appetite: Megace is working well. 6. Difficult Swallowing: DG Esophagus scheduled for July 24th. Follow up with Dr Grayland Ormond on the 25th for results.  Patient expressed understanding and was in agreement with this plan. She also understands that She can call clinic at any time with any questions, concerns, or complaints.   Dr. Rogue Bussing was available for consultation and review of plan of care for this patient.  Mayra Reel, NP   12/15/2015 3:04 PM

## 2015-12-15 NOTE — Progress Notes (Signed)
Patient here for sick visit. Patient has been having  dificukty eating and keep food down. She states that she vomits up food at least once a day. She also has complaints of a sore mouth. She describes the feeling as spasmodic, she eats and then feels a burning sensation in her chest followed by vomiting. Despite and increase in calories and a new perscription for megace she has lost 2 pounds. Her voice is very raspy with she contributes to the constant vomiting.

## 2015-12-16 ENCOUNTER — Other Ambulatory Visit: Payer: Self-pay

## 2015-12-19 ENCOUNTER — Ambulatory Visit
Admission: RE | Admit: 2015-12-19 | Discharge: 2015-12-19 | Disposition: A | Discharge status: Home / Self Care | Payer: PPO | Source: Ambulatory Visit | Attending: Internal Medicine | Admitting: Internal Medicine

## 2015-12-19 DIAGNOSIS — R1310 Dysphagia, unspecified: Secondary | ICD-10-CM | POA: Diagnosis present

## 2015-12-19 DIAGNOSIS — K222 Esophageal obstruction: Secondary | ICD-10-CM | POA: Insufficient documentation

## 2015-12-19 DIAGNOSIS — J9819 Other pulmonary collapse: Secondary | ICD-10-CM | POA: Insufficient documentation

## 2015-12-19 DIAGNOSIS — R918 Other nonspecific abnormal finding of lung field: Secondary | ICD-10-CM | POA: Insufficient documentation

## 2015-12-19 DIAGNOSIS — C3402 Malignant neoplasm of left main bronchus: Secondary | ICD-10-CM

## 2015-12-20 ENCOUNTER — Inpatient Hospital Stay (HOSPITAL_BASED_OUTPATIENT_CLINIC_OR_DEPARTMENT_OTHER): Payer: PPO | Admitting: Oncology

## 2015-12-20 ENCOUNTER — Inpatient Hospital Stay: Payer: PPO

## 2015-12-20 VITALS — BP 104/69 | HR 90 | Temp 97.4°F | Wt 105.6 lb

## 2015-12-20 DIAGNOSIS — R630 Anorexia: Secondary | ICD-10-CM

## 2015-12-20 DIAGNOSIS — R1310 Dysphagia, unspecified: Secondary | ICD-10-CM

## 2015-12-20 DIAGNOSIS — Z87442 Personal history of urinary calculi: Secondary | ICD-10-CM

## 2015-12-20 DIAGNOSIS — F419 Anxiety disorder, unspecified: Secondary | ICD-10-CM

## 2015-12-20 DIAGNOSIS — D649 Anemia, unspecified: Secondary | ICD-10-CM

## 2015-12-20 DIAGNOSIS — F329 Major depressive disorder, single episode, unspecified: Secondary | ICD-10-CM

## 2015-12-20 DIAGNOSIS — Z87891 Personal history of nicotine dependence: Secondary | ICD-10-CM

## 2015-12-20 DIAGNOSIS — J189 Pneumonia, unspecified organism: Secondary | ICD-10-CM | POA: Diagnosis not present

## 2015-12-20 DIAGNOSIS — R634 Abnormal weight loss: Secondary | ICD-10-CM

## 2015-12-20 DIAGNOSIS — C3402 Malignant neoplasm of left main bronchus: Secondary | ICD-10-CM

## 2015-12-20 DIAGNOSIS — J449 Chronic obstructive pulmonary disease, unspecified: Secondary | ICD-10-CM

## 2015-12-20 DIAGNOSIS — I2510 Atherosclerotic heart disease of native coronary artery without angina pectoris: Secondary | ICD-10-CM

## 2015-12-20 DIAGNOSIS — G35 Multiple sclerosis: Secondary | ICD-10-CM | POA: Diagnosis not present

## 2015-12-20 DIAGNOSIS — J439 Emphysema, unspecified: Secondary | ICD-10-CM

## 2015-12-20 DIAGNOSIS — Z5111 Encounter for antineoplastic chemotherapy: Secondary | ICD-10-CM | POA: Diagnosis not present

## 2015-12-20 DIAGNOSIS — J90 Pleural effusion, not elsewhere classified: Secondary | ICD-10-CM

## 2015-12-20 DIAGNOSIS — Z79899 Other long term (current) drug therapy: Secondary | ICD-10-CM

## 2015-12-20 DIAGNOSIS — Z720 Tobacco use: Secondary | ICD-10-CM | POA: Insufficient documentation

## 2015-12-20 DIAGNOSIS — I4891 Unspecified atrial fibrillation: Secondary | ICD-10-CM

## 2015-12-20 DIAGNOSIS — K279 Peptic ulcer, site unspecified, unspecified as acute or chronic, without hemorrhage or perforation: Secondary | ICD-10-CM | POA: Insufficient documentation

## 2015-12-20 LAB — CBC WITH DIFFERENTIAL/PLATELET
BASOS PCT: 1 %
Basophils Absolute: 0.1 10*3/uL (ref 0–0.1)
EOS ABS: 0.3 10*3/uL (ref 0–0.7)
Eosinophils Relative: 2 %
HEMATOCRIT: 31.2 % — AB (ref 35.0–47.0)
HEMOGLOBIN: 10.2 g/dL — AB (ref 12.0–16.0)
LYMPHS ABS: 0.9 10*3/uL — AB (ref 1.0–3.6)
Lymphocytes Relative: 7 %
MCH: 27.5 pg (ref 26.0–34.0)
MCHC: 32.6 g/dL (ref 32.0–36.0)
MCV: 84.6 fL (ref 80.0–100.0)
MONOS PCT: 8 %
Monocytes Absolute: 1 10*3/uL — ABNORMAL HIGH (ref 0.2–0.9)
NEUTROS ABS: 10.9 10*3/uL — AB (ref 1.4–6.5)
NEUTROS PCT: 82 %
Platelets: 588 10*3/uL — ABNORMAL HIGH (ref 150–440)
RBC: 3.69 MIL/uL — AB (ref 3.80–5.20)
RDW: 17.5 % — ABNORMAL HIGH (ref 11.5–14.5)
WBC: 13.2 10*3/uL — AB (ref 3.6–11.0)

## 2015-12-20 LAB — COMPREHENSIVE METABOLIC PANEL
ALBUMIN: 3 g/dL — AB (ref 3.5–5.0)
ALK PHOS: 92 U/L (ref 38–126)
ALT: 41 U/L (ref 14–54)
ANION GAP: 10 (ref 5–15)
AST: 36 U/L (ref 15–41)
BILIRUBIN TOTAL: 0.5 mg/dL (ref 0.3–1.2)
BUN: 19 mg/dL (ref 6–20)
CALCIUM: 9.2 mg/dL (ref 8.9–10.3)
CO2: 24 mmol/L (ref 22–32)
CREATININE: 0.7 mg/dL (ref 0.44–1.00)
Chloride: 104 mmol/L (ref 101–111)
GFR calc Af Amer: >60 mL/min (ref >60)
GFR calc non Af Amer: >60 mL/min (ref >60)
GLUCOSE: 104 mg/dL — AB (ref 65–99)
Potassium: 4.2 mmol/L (ref 3.5–5.1)
SODIUM: 138 mmol/L (ref 135–145)
Total Protein: 7.7 g/dL (ref 6.5–8.1)

## 2015-12-20 MED ORDER — NIVOLUMAB CHEMO INJECTION 100 MG/10ML
240.0000 mg | Freq: Once | INTRAVENOUS | Status: AC
  Administered 2015-12-20: 240 mg via INTRAVENOUS
  Filled 2015-12-20: qty 4

## 2015-12-20 MED ORDER — SODIUM CHLORIDE 0.9 % IV SOLN
Freq: Once | INTRAVENOUS | Status: AC
  Administered 2015-12-20: 16:00:00 via INTRAVENOUS
  Filled 2015-12-20: qty 1000

## 2015-12-20 MED ORDER — SODIUM CHLORIDE 0.9% FLUSH
3.0000 mL | INTRAVENOUS | Status: DC | PRN
  Filled 2015-12-20: qty 3

## 2015-12-21 ENCOUNTER — Telehealth: Payer: Self-pay | Admitting: *Deleted

## 2015-12-21 NOTE — Telephone Encounter (Signed)
Pt called to inform Dr. Grayland Ormond that the Pain Clinic doctor at [facility] is Dr. Lonny Prude and he can be reached at [phone]. Also pt was wondering if Dr. Grayland Ormond was able to get in contact with the "thoracic doctor."

## 2015-12-21 NOTE — Telephone Encounter (Signed)
Per Dr Grayland Ormond, he discussed this with patient yesterday and needs to speak with her pain clinic doctor. Informed Evelyn Willis of this.

## 2015-12-21 NOTE — Telephone Encounter (Signed)
Daughter called to report that her mother is not able to keep her pain med down and she is also losing a lot of weight. Please advise

## 2015-12-23 ENCOUNTER — Telehealth: Payer: Self-pay | Admitting: Oncology

## 2015-12-23 NOTE — Telephone Encounter (Signed)
Patient does not have enough hydrocodone and morphine to get her through the weekend. She said the prescription has to be "different" and the doctor knows about it already. She can come to [place] to pick up Rx when it's ready. Her #: [phone]

## 2015-12-23 NOTE — Telephone Encounter (Signed)
I've called her pain clinic physician at [facility] and have left messages with him and his nurse.  I am awaiting a call back.  Thanks!

## 2015-12-23 NOTE — Telephone Encounter (Signed)
Call returned to patient to inform her Dr. Grayland Ormond is trying to reach her physician at [facility] pain clinic and that he cannot prescribe pain medications until he is able to speak with them. Patient verbalized understanding.

## 2015-12-24 NOTE — Progress Notes (Signed)
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  INTERVAL HISTORY: Patient returns to clinic for further evaluation and consideration of cycle 3 of nivolumab. She continues to have difficulty swallowing. She has a poor appetite and has weight loss. She has weakness and fatigue. She has significant pain. She continues to have both dyspnea on exertion and shortness of breath. She has no neurologic complaints. She denies any fevers. She denies constipation or diarrhea. She has no urinary complaints. Patient otherwise feels well and offers no further specific complaints.   REVIEW OF SYSTEMS:   Review of Systems  Constitutional: Positive for malaise/fatigue. Negative for fever and weight loss.  HENT:       Difficulty swallowing  Respiratory: Positive for shortness of breath. Negative for cough.   Cardiovascular: Negative for chest pain.  Gastrointestinal: Positive for nausea. Negative for abdominal pain, blood in stool and melena.  Musculoskeletal: Positive for back pain and joint pain.  Neurological: Positive for weakness.  Psychiatric/Behavioral: The patient is nervous/anxious.     As per HPI. Otherwise, a complete review of systems is negatve.  PAST MEDICAL HISTORY: Past Medical History:  Diagnosis Date  . Atrial fibrillation ([place])   . Cancer [facility]) 2016   Left lung cancer, radiation, chemo  . COPD (chronic obstructive pulmonary disease) ([place])   . Coronary artery disease    said- stent was placed 10 yrs ago from now( 2017) in [place], [place]  . Depression   . Emphysema lung ([place])   . Hematuria   . Multiple sclerosis  ([place])   . Renal calculi     PAST SURGICAL HISTORY: Past Surgical History:  Procedure Laterality Date  . EXTRACORPOREAL SHOCK WAVE LITHOTRIPSY Right 11/03/2015   Procedure: EXTRACORPOREAL SHOCK WAVE LITHOTRIPSY (ESWL);  Surgeon: Hollice Espy, MD;  Location: [facility] ORS;  Service: Urology;  Laterality: Right;  . stomach ulcer removal  2011  . TOTAL HIP ARTHROPLASTY  2011    FAMILY HISTORY Family History  Problem Relation Age of Onset  . CAD Mother   . Atrial fibrillation Mother   . Heart attack Neg Hx   . Hypertension Neg Hx   . Stroke Neg Hx   . Kidney disease Neg Hx        ADVANCED DIRECTIVES:    HEALTH MAINTENANCE: Social History  Substance Use Topics  . Smoking status: Former Smoker    Packs/day: 1.50    Years: 45.00    Quit date: 07/26/2015  . Smokeless tobacco: Not on file     Comment: x 2 weeks  . Alcohol use 0.0 oz/week     Allergies  Allergen Reactions  . Aspirin Other (See Comments)    Pt gets bleeding ulcers when she takes this medication Bleeding ulcers  . Nsaids Other (See Comments)    Other reaction(s): Other (See Comments) Cannot take due to significant peptic ulcer disease. Cannot take due to significant peptic ulcer disease.    Current Outpatient Prescriptions  Medication Sig Dispense Refill  . albuterol (PROVENTIL HFA;VENTOLIN HFA) 108 (90 Base) MCG/ACT inhaler Inhale into the lungs.    Marland Kitchen amiodarone (PACERONE) 200 MG tablet Take 200 mg by mouth.    Marland Kitchen amitriptyline (  ELAVIL) 75 MG tablet     . amphetamine-dextroamphetamine (ADDERALL) 20 MG tablet Take by mouth.    Marland Kitchen buPROPion (WELLBUTRIN XL) 150 MG 24 hr tablet Take by mouth.    . docusate sodium (COLACE) 100 MG capsule Take 1 capsule (100 mg total) by mouth 2 (two) times daily. 60 capsule 0  . fludrocortisone (FLORINEF) 0.1 MG tablet Take 1 tablet (0.1 mg total) by mouth daily. 30 tablet 6  . HYDROcodone-acetaminophen (NORCO) 10-325 MG tablet     . lidocaine-prilocaine (EMLA) cream Apply to  affected area once 30 g 3  . megestrol (MEGACE) 40 MG tablet Take 1 tablet (40 mg total) by mouth daily. 30 tablet 2  . metoprolol succinate (TOPROL-XL) 25 MG 24 hr tablet Take 25 mg by mouth.    . mirtazapine (REMERON) 15 MG tablet TAKE 1 TABLET BY MOUTH AT BEDTIME    . morphine (MS CONTIN) 15 MG 12 hr tablet Take by mouth.    . Naloxone HCl (NARCAN NA) Place into the nose.    . nicotine (NICODERM CQ - DOSED IN MG/24 HOURS) 21 mg/24hr patch Place 21 mg onto the skin daily.    . ondansetron (ZOFRAN) 4 MG tablet     . SPIRIVA HANDIHALER 18 MCG inhalation capsule Place 1 capsule into inhaler and inhale daily.    Marland Kitchen tiotropium (SPIRIVA) 18 MCG inhalation capsule Place into inhaler and inhale.     No current facility-administered medications for this visit.     OBJECTIVE: Vitals:   12/20/15 1455  BP: 104/69  Pulse: 90  Temp: 97.4 F (36.3 C)     Body mass index is 16.54 kg/m.    ECOG FS:1 - Symptomatic but completely ambulatory  General: Well-developed, well-nourished, no acute distress. Eyes: Pink conjunctiva, anicteric sclera. Lungs: Diminished left breast sounds. Scattered wheezing throughout. Heart: Regular rate and rhythm. No rubs, murmurs, or gallops. Abdomen: Soft, nontender, nondistended. No organomegaly noted, normoactive bowel sounds. Musculoskeletal: No edema, cyanosis, or clubbing. Neuro: Alert, answering all questions appropriately. Cranial nerves grossly intact. Skin: No rashes or petechiae noted. Psych: Normal affect.   LAB RESULTS:  Lab Results  Component Value Date   NA 138 12/20/2015   K 4.2 12/20/2015   CL 104 12/20/2015   CO2 24 12/20/2015   GLUCOSE 104 (H) 12/20/2015   BUN 19 12/20/2015   CREATININE 0.70 12/20/2015   CALCIUM 9.2 12/20/2015   PROT 7.7 12/20/2015   ALBUMIN 3.0 (L) 12/20/2015   AST 36 12/20/2015   ALT 41 12/20/2015   ALKPHOS 92 12/20/2015   BILITOT 0.5 12/20/2015   GFRNONAA >60 12/20/2015   GFRAA >60 12/20/2015    Lab Results    Component Value Date   WBC 13.2 (H) 12/20/2015   NEUTROABS 10.9 (H) 12/20/2015   HGB 10.2 (L) 12/20/2015   HCT 31.2 (L) 12/20/2015   MCV 84.6 12/20/2015   PLT 588 (H) 12/20/2015     STUDIES: Dg Esophagus  Result Date: 12/19/2015 CLINICAL DATA:  Pt states feels like swallowing air, trouble swallowing solids AND liquids x 4 weeks, pt currently being treated for lung cancer, smoker x 45 years. EXAM: ESOPHOGRAM / BARIUM SWALLOW TECHNIQUE: Combined double contrast and single contrast examination performed using effervescent crystals, thick barium liquid. FLUOROSCOPY TIME:  Radiation Exposure Index (as provided by the fluoroscopic device): 2.7 mGy COMPARISON:  None. FINDINGS: There was normal pharyngeal anatomy and motility. 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 There is delayed transit of  liquid barium through the stricture without complete obstruction. Left lung collapse with near complete opacification of the left lung. IMPRESSION: 1. 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 Left lung collapse with near complete opacification of the left lung. Electronically Signed   By: Kathreen Devoid   On: 12/19/2015 11:28   ONCOLOGIC HISTORY: Patient was initially diagnosed with squamous cell carcinoma the lung in June 2016. PET scan at that time revealed a 2.9 cm left hilar mass with no evidence of lymphadenopathy or distant metastasis. She was stage IIa disease based on one level one lymph node that returned positive for squamous cell on EBUS. She proceeded with concurrent chemotherapy and XRT at [facility]. She initially was started on carboplatinum and Taxol. Her XRT started on December 09, 2014 and she completed 6600 cGy in 33 fractions. She had a Taxol infusion reaction on cycle 4 and was switched to Abraxane '25mg'$ /m2 for cycle 5. Patient completed 7 weekly cycles of chemotherapy in approximately November 2016. By  report, post-treatment scan revealed "stable disease".    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  PLAN:    1. Squamous cell carcinoma of the hilus of left lung: Biopsy confirmed recurrent/persistent squamous cell carcinoma. Proceed with cycle 3 of 6 of salvage nivolumab today. Case has been discussed with thoracic surgery and no surgical intervention is available to help reexpand her left lung. Return to clinic in 2 weeks for consideration of cycle 4.  2. Multiple sclerosis: Managed at [facility]. By report patient no longer sees her neurologist but continues at pain clinic at [facility].  3. Anemia: Mild, monitor. 4. Pain: Patient receives her narcotics from the pain clinic at [facility]. A call was placed to Dr. Lonny Prude at [phone] for further discussion of her pain. Patient has a narcotics contract with [facility] and should continue to receive all her pain medication. She should not be given prescriptions from this clinic.  5. Poor appetite: Continue Megace. 6. Difficult Swallowing: Swallow study as above with severe high-grade narrowing at the midthoracic esophagus likely secondary to external compression of her known left lung mass. Continue treatment as above.  Patient expressed understanding and was in agreement with this plan. She also understands that She can call clinic at any time with any questions, concerns, or complaints.    Lloyd Huger, MD   12/24/2015 8:02 AM

## 2015-12-26 ENCOUNTER — Telehealth: Payer: Self-pay

## 2015-12-26 ENCOUNTER — Other Ambulatory Visit: Payer: Self-pay

## 2015-12-26 MED ORDER — FIRST-DUKES MOUTHWASH MT SUSP: 5.0000 mL | Freq: Four times a day (QID) | OROMUCOSAL | 2 refills | Status: DC

## 2015-12-26 NOTE — Telephone Encounter (Signed)
Patient called stating she had a lost a Rx that Trish had per scribed for her to help with mouth sores.  Patient did not know what mediation she had been per scribed.  Contacted her pharmacy and patient had been per scribed Florinef at time of last visit.  Would be unable to refill that due to insurance. E-scribed Dukes mouth was and called patient

## 2015-12-26 NOTE — Telephone Encounter (Signed)
Daughter called, she is going to get her mother to go in to ED in order to be evaluated and Dr. Grayland Ormond will be contacted for possible PEG tube placement

## 2016-01-03 ENCOUNTER — Inpatient Hospital Stay: Payer: PPO | Attending: Hematology and Oncology | Admitting: Hematology and Oncology

## 2016-01-03 ENCOUNTER — Inpatient Hospital Stay: Payer: PPO

## 2016-01-03 ENCOUNTER — Inpatient Hospital Stay (HOSPITAL_BASED_OUTPATIENT_CLINIC_OR_DEPARTMENT_OTHER): Payer: PPO

## 2016-01-03 ENCOUNTER — Encounter: Payer: Self-pay | Admitting: Hematology and Oncology

## 2016-01-03 ENCOUNTER — Other Ambulatory Visit: Payer: Self-pay | Admitting: *Deleted

## 2016-01-03 VITALS — BP 97/66 | HR 94 | Temp 97.7°F | Resp 18 | Wt 103.5 lb

## 2016-01-03 DIAGNOSIS — M2550 Pain in unspecified joint: Secondary | ICD-10-CM | POA: Insufficient documentation

## 2016-01-03 DIAGNOSIS — J439 Emphysema, unspecified: Secondary | ICD-10-CM | POA: Insufficient documentation

## 2016-01-03 DIAGNOSIS — R630 Anorexia: Secondary | ICD-10-CM

## 2016-01-03 DIAGNOSIS — R531 Weakness: Secondary | ICD-10-CM | POA: Diagnosis not present

## 2016-01-03 DIAGNOSIS — Z7951 Long term (current) use of inhaled steroids: Secondary | ICD-10-CM | POA: Diagnosis not present

## 2016-01-03 DIAGNOSIS — K1370 Unspecified lesions of oral mucosa: Secondary | ICD-10-CM | POA: Diagnosis not present

## 2016-01-03 DIAGNOSIS — I2510 Atherosclerotic heart disease of native coronary artery without angina pectoris: Secondary | ICD-10-CM

## 2016-01-03 DIAGNOSIS — C3402 Malignant neoplasm of left main bronchus: Secondary | ICD-10-CM

## 2016-01-03 DIAGNOSIS — F419 Anxiety disorder, unspecified: Secondary | ICD-10-CM | POA: Insufficient documentation

## 2016-01-03 DIAGNOSIS — Z9221 Personal history of antineoplastic chemotherapy: Secondary | ICD-10-CM | POA: Insufficient documentation

## 2016-01-03 DIAGNOSIS — J449 Chronic obstructive pulmonary disease, unspecified: Secondary | ICD-10-CM | POA: Insufficient documentation

## 2016-01-03 DIAGNOSIS — R1310 Dysphagia, unspecified: Secondary | ICD-10-CM | POA: Diagnosis not present

## 2016-01-03 DIAGNOSIS — R5383 Other fatigue: Secondary | ICD-10-CM | POA: Diagnosis not present

## 2016-01-03 DIAGNOSIS — Z87891 Personal history of nicotine dependence: Secondary | ICD-10-CM | POA: Diagnosis not present

## 2016-01-03 DIAGNOSIS — R490 Dysphonia: Secondary | ICD-10-CM | POA: Diagnosis not present

## 2016-01-03 DIAGNOSIS — Z79899 Other long term (current) drug therapy: Secondary | ICD-10-CM | POA: Insufficient documentation

## 2016-01-03 DIAGNOSIS — D649 Anemia, unspecified: Secondary | ICD-10-CM

## 2016-01-03 DIAGNOSIS — F329 Major depressive disorder, single episode, unspecified: Secondary | ICD-10-CM

## 2016-01-03 DIAGNOSIS — R634 Abnormal weight loss: Secondary | ICD-10-CM | POA: Diagnosis not present

## 2016-01-03 DIAGNOSIS — R0602 Shortness of breath: Secondary | ICD-10-CM | POA: Diagnosis not present

## 2016-01-03 DIAGNOSIS — Z87442 Personal history of urinary calculi: Secondary | ICD-10-CM | POA: Diagnosis not present

## 2016-01-03 DIAGNOSIS — Z5112 Encounter for antineoplastic immunotherapy: Secondary | ICD-10-CM | POA: Diagnosis not present

## 2016-01-03 DIAGNOSIS — G35 Multiple sclerosis: Secondary | ICD-10-CM | POA: Insufficient documentation

## 2016-01-03 DIAGNOSIS — R319 Hematuria, unspecified: Secondary | ICD-10-CM | POA: Insufficient documentation

## 2016-01-03 DIAGNOSIS — M549 Dorsalgia, unspecified: Secondary | ICD-10-CM | POA: Diagnosis not present

## 2016-01-03 DIAGNOSIS — I4891 Unspecified atrial fibrillation: Secondary | ICD-10-CM | POA: Diagnosis not present

## 2016-01-03 LAB — CBC WITH DIFFERENTIAL/PLATELET
BASOS ABS: 0.1 10*3/uL (ref 0–0.1)
BASOS PCT: 0 %
EOS ABS: 0.3 10*3/uL (ref 0–0.7)
Eosinophils Relative: 2 %
HEMATOCRIT: 28.2 % — AB (ref 35.0–47.0)
Hemoglobin: 9 g/dL — ABNORMAL LOW (ref 12.0–16.0)
Lymphocytes Relative: 5 %
Lymphs Abs: 0.7 10*3/uL — ABNORMAL LOW (ref 1.0–3.6)
MCH: 26 pg (ref 26.0–34.0)
MCHC: 32 g/dL (ref 32.0–36.0)
MCV: 81.4 fL (ref 80.0–100.0)
MONO ABS: 0.8 10*3/uL (ref 0.2–0.9)
MONOS PCT: 7 %
NEUTROS ABS: 10.7 10*3/uL — AB (ref 1.4–6.5)
Neutrophils Relative %: 86 %
PLATELETS: 607 10*3/uL — AB (ref 150–440)
RBC: 3.46 MIL/uL — ABNORMAL LOW (ref 3.80–5.20)
RDW: 17.8 % — AB (ref 11.5–14.5)
WBC: 12.6 10*3/uL — ABNORMAL HIGH (ref 3.6–11.0)

## 2016-01-03 LAB — COMPREHENSIVE METABOLIC PANEL
ALBUMIN: 3.1 g/dL — AB (ref 3.5–5.0)
ALT: 39 U/L (ref 14–54)
ANION GAP: 9 (ref 5–15)
AST: 25 U/L (ref 15–41)
Alkaline Phosphatase: 109 U/L (ref 38–126)
BUN: 17 mg/dL (ref 6–20)
CHLORIDE: 103 mmol/L (ref 101–111)
CO2: 23 mmol/L (ref 22–32)
Calcium: 8.8 mg/dL — ABNORMAL LOW (ref 8.9–10.3)
Creatinine, Ser: 0.77 mg/dL (ref 0.44–1.00)
GFR calc Af Amer: >60 mL/min (ref >60)
GFR calc non Af Amer: >60 mL/min (ref >60)
GLUCOSE: 111 mg/dL — AB (ref 65–99)
POTASSIUM: 3.9 mmol/L (ref 3.5–5.1)
SODIUM: 135 mmol/L (ref 135–145)
TOTAL PROTEIN: 7.5 g/dL (ref 6.5–8.1)
Total Bilirubin: 0.6 mg/dL (ref 0.3–1.2)

## 2016-01-03 LAB — IRON AND TIBC
Iron: 13 ug/dL — ABNORMAL LOW (ref 28–170)
Saturation Ratios: 5 % — ABNORMAL LOW (ref 10.4–31.8)
TIBC: 247 ug/dL — ABNORMAL LOW (ref 250–450)
UIBC: 234 ug/dL

## 2016-01-03 LAB — FERRITIN: Ferritin: 459 ng/mL — ABNORMAL HIGH (ref 11–307)

## 2016-01-03 LAB — FOLATE: Folate: 27 ng/mL (ref >5.9)

## 2016-01-03 LAB — VITAMIN B12: Vitamin B-12: 460 pg/mL (ref 180–914)

## 2016-01-03 MED ORDER — MEGESTROL ACETATE 400 MG/10ML PO SUSP: 200.0000 mg | Freq: Every day | ORAL | 0 refills | Status: DC

## 2016-01-03 MED ORDER — SODIUM CHLORIDE 0.9 % IV SOLN
240.0000 mg | Freq: Once | INTRAVENOUS | Status: AC
  Administered 2016-01-03: 240 mg via INTRAVENOUS
  Filled 2016-01-03: qty 20

## 2016-01-03 MED ORDER — SODIUM CHLORIDE 0.9 % IV SOLN
Freq: Once | INTRAVENOUS | Status: AC
  Administered 2016-01-03: 12:00:00 via INTRAVENOUS
  Filled 2016-01-03: qty 1000

## 2016-01-03 NOTE — Progress Notes (Signed)
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  INTERVAL HISTORY: Patient returns to clinic for further evaluation and consideration of cycle #4 of nivolumab.   She was last seen in the medical oncology clinic by Dr. Grayland Ormond on 12/20/2015.  At that time, she had difficulty swallowing, poor appetite and weight loss. She had weakness and fatigue. She had significant pain. She had both dyspnea on exertion and shortness of breath. She had no neurologic complaints. She denied any fevers.  Symptomatically, she is feeling better today.  She describes her "esophagus being closed up, throwing up and getting hoarse".  She denies any nausea today.  She has had sores in her mouth x 1 month.  Magic mouthwash is not helping.  She is "losing weight like crazy".  She eats as much as she can.  She is taking Megace 40 mg a day.  REVIEW OF SYSTEMS:   Review of Systems  Constitutional: Positive for malaise/fatigue and weight loss. Negative for fever.  HENT: Positive for sore throat (mouth sores). Negative for congestion and nosebleeds.        Difficulty swallowing  Eyes: Negative for blurred vision.  Respiratory: Positive for shortness of breath and wheezing. Negative for cough.   Cardiovascular: Negative for chest pain, orthopnea and leg swelling.  Gastrointestinal: Negative for abdominal pain, blood in stool, melena, nausea and vomiting.  Genitourinary: Negative for dysuria, frequency, hematuria and urgency.  Musculoskeletal: Positive for back pain and joint pain.  Neurological: Positive for speech change (hoarse) and weakness. Negative for  dizziness.  Psychiatric/Behavioral: The patient is nervous/anxious.     As per HPI. Otherwise, a complete review of systems is negatve.  PAST MEDICAL HISTORY: Past Medical History:  Diagnosis Date  . Atrial fibrillation ([place])   . Cancer [facility]) 2016   Left lung cancer, radiation, chemo  . COPD (chronic obstructive pulmonary disease) ([place])   . Coronary artery disease    said- stent was placed 10 yrs ago from now( 2017) in [place], [place]  . Depression   . Emphysema lung ([place])   . Hematuria   . Multiple sclerosis ([place])   . Renal calculi     PAST SURGICAL HISTORY: Past Surgical History:  Procedure Laterality Date  . EXTRACORPOREAL SHOCK WAVE LITHOTRIPSY Right 11/03/2015   Procedure: EXTRACORPOREAL SHOCK WAVE LITHOTRIPSY (ESWL);  Surgeon: Hollice Espy, MD;  Location: [facility] ORS;  Service: Urology;  Laterality: Right;  . stomach ulcer removal  2011  . TOTAL HIP ARTHROPLASTY  2011    FAMILY HISTORY Family History  Problem Relation Age of Onset  . CAD Mother   . Atrial fibrillation Mother   . Heart attack Neg Hx   . Hypertension Neg Hx   . Stroke Neg Hx   . Kidney disease Neg Hx        ADVANCED DIRECTIVES:    HEALTH MAINTENANCE: Social History  Substance Use Topics  . Smoking status: Former Smoker    Packs/day: 1.50    Years: 45.00    Quit date: 07/26/2015  . Smokeless tobacco: Not on file     Comment: x 2 weeks  . Alcohol use 0.0 oz/week     Allergies  Allergen Reactions  . Aspirin Other (See Comments)    Pt gets bleeding ulcers when she takes this medication Bleeding ulcers  . Nsaids Other (See Comments)    Other reaction(s): Other (See Comments) Cannot take due to significant peptic ulcer disease. Cannot take due to significant peptic ulcer disease.    Current Outpatient Prescriptions  Medication Sig Dispense Refill  . albuterol (PROVENTIL HFA;VENTOLIN HFA) 108 (90 Base) MCG/ACT inhaler Inhale into the lungs.    Marland Kitchen amiodarone (PACERONE) 200 MG tablet Take  200 mg by mouth.    Marland Kitchen amitriptyline (ELAVIL) 75 MG tablet     . buPROPion (WELLBUTRIN XL) 150 MG 24 hr tablet Take by mouth.    . Diphenhyd-Hydrocort-Nystatin (FIRST-DUKES MOUTHWASH) SUSP Use as directed 5 mLs in the mouth or throat 4 (four) times daily. 240 mL 2  . docusate sodium (COLACE) 100 MG capsule Take 1 capsule (100 mg total) by mouth 2 (two) times daily. 60 capsule 0  . fludrocortisone (FLORINEF) 0.1 MG tablet Take 1 tablet (0.1 mg total) by mouth daily. 30 tablet 6  . HYDROcodone-acetaminophen (NORCO) 10-325 MG tablet     . lidocaine-prilocaine (EMLA) cream Apply to affected area once 30 g 3  . metoprolol succinate (TOPROL-XL) 25 MG 24 hr tablet Take 25 mg by mouth.    . mirtazapine (REMERON) 15 MG tablet TAKE 1 TABLET BY MOUTH AT BEDTIME    . morphine (MS CONTIN) 15 MG 12 hr tablet Take by mouth.    . Naloxone HCl (NARCAN NA) Place into the nose.    . nicotine (NICODERM CQ - DOSED IN MG/24 HOURS) 21 mg/24hr patch Place 21 mg onto the skin daily.    . ondansetron (ZOFRAN) 4 MG tablet     . tiotropium (SPIRIVA) 18 MCG inhalation capsule Place into inhaler and inhale.    . megestrol (MEGACE) 400 MG/10ML suspension Take 5 mLs (200 mg total) by mouth daily. 150 mL 0   No current facility-administered medications for this visit.     OBJECTIVE: Vitals:   01/03/16 1045  BP: 97/66  Pulse: 94  Resp: 18  Temp: 97.7 F (36.5 C)     Body mass index is 16.21 kg/m.    ECOG FS:1 - Symptomatic but completely ambulatory  GENERAL:  Thin chronically ill appearing woman sitting comfortably in the exam room in no acute distress. MENTAL STATUS:  Alert and oriented to person, place and time. HEAD:  Styled brown hair with slight graying.  Normocephalic, atraumatic, face symmetric, no Cushingoid features. EYES:  Brown eyes.  Pupils equal round and reactive to light and accomodation.  No conjunctivitis or scleral icterus. ENT:  Thrush, mild.  Tongue normal. Mucous membranes moist.  RESPIRATORY:   Decreased respiratory excursion.  Soft wheezes. No rales or rhonchi. CARDIOVASCULAR:  Regular rate and rhythm without murmur, rub or gallop. ABDOMEN:  Soft, non-tender, with active bowel sounds, and no hepatosplenomegaly.  No masses. SKIN:  Upper extremity ecchymosis.  No rashes, ulcers or lesions. EXTREMITIES: Thin extremities.  No edema, no skin discoloration or tenderness.  No palpable cords. LYMPH NODES: No palpable cervical, supraclavicular, axillary or inguinal adenopathy  NEUROLOGICAL: Unremarkable. PSYCH:  Appropriate.    LAB RESULTS:  Lab Results  Component Value Date   NA 135 01/03/2016   K 3.9 01/03/2016   CL 103 01/03/2016   CO2 23 01/03/2016   GLUCOSE 111 (H) 01/03/2016   BUN 17 01/03/2016   CREATININE 0.77 01/03/2016   CALCIUM 8.8 (L) 01/03/2016  PROT 7.5 01/03/2016   ALBUMIN 3.1 (L) 01/03/2016   AST 25 01/03/2016   ALT 39 01/03/2016   ALKPHOS 109 01/03/2016   BILITOT 0.6 01/03/2016   GFRNONAA >60 01/03/2016   GFRAA >60 01/03/2016    Lab Results  Component Value Date   WBC 12.6 (H) 01/03/2016   NEUTROABS 10.7 (H) 01/03/2016   HGB 9.0 (L) 01/03/2016   HCT 28.2 (L) 01/03/2016   MCV 81.4 01/03/2016   PLT 607 (H) 01/03/2016     STUDIES: Dg Esophagus  Result Date: 12/19/2015 CLINICAL DATA:  Pt states feels like swallowing air, trouble swallowing solids AND liquids x 4 weeks, pt currently being treated for lung cancer, smoker x 45 years. EXAM: ESOPHOGRAM / BARIUM SWALLOW TECHNIQUE: Combined double contrast and single contrast examination performed using effervescent crystals, thick barium liquid. FLUOROSCOPY TIME:  Radiation Exposure Index (as provided by the fluoroscopic device): 2.7 mGy COMPARISON:  None. FINDINGS: There was normal pharyngeal anatomy and motility. 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 There is delayed transit of liquid barium through the stricture without complete obstruction. Left lung collapse  with near complete opacification of the left lung. IMPRESSION: 1. 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 Left lung collapse with near complete opacification of the left lung. Electronically Signed   By: Kathreen Devoid   On: 12/19/2015 11:28   ONCOLOGIC HISTORY: Patient was initially diagnosed with squamous cell carcinoma the lung in June 2016. PET scan at that time revealed a 2.9 cm left hilar mass with no evidence of lymphadenopathy or distant metastasis. She was stage IIa disease based on one level one lymph node that returned positive for squamous cell on EBUS. She proceeded with concurrent chemotherapy and XRT at [facility]. She initially was started on carboplatinum and Taxol. Her XRT started on December 09, 2014 and she completed 6600 cGy in 33 fractions. She had a Taxol infusion reaction on cycle 4 and was switched to Abraxane '25mg'$ /m2 for cycle 5. Patient completed 7 weekly cycles of chemotherapy in approximately November 2016. By report, post-treatment scan revealed "stable disease".    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  PLAN:    1. Squamous cell carcinoma of the hilus of left lung: Biopsy confirmed recurrent/persistent squamous cell carcinoma. Proceed with cycle 4 of 6 of salvage nivolumab today.  Patient in agreement with ongoing treatment.  No surgical intervention is available to help reexpand her left lung. Return to clinic in 2 weeks for consideration of cycle #5.   2. Multiple sclerosis: Managed at [facility]. By report patient no longer sees her neurologist but continues at pain clinic at [facility].   3. Anemia:  Perisitent.  Poor oral intake.  Chesk ferritin, iron studies, B12, folate today.  Add labs (TSH and free T4) to next clicic visit.  4. Pain: Patient receives her narcotics from the pain clinic at [facility]. A call was placed to Dr. Lonny Prude at  [phone] for further discussion of her pain. Patient has a narcotics contract with [facility] and should continue to receive all her pain medication. She should not be given prescriptions from this clinic.   5. Poor appetite: Continue Megace.  Increase dose to 200 mg a day (liquid preparation; 5 cc a day).  6. Difficult Swallowing: Swallow study as above with severe high-grade narrowing at the midthoracic esophagus likely secondary to external  compression of her known left lung mass. Continue treatment.  Mild buccal mucosa thrush.  Discuss effective use of Magic Mouthwash.  Patient expressed understanding and was in agreement with this plan. She also understands that She can call clinic at any time with any questions, concerns, or complaints.    Lequita Asal, MD   01/03/2016 11:36 AM

## 2016-01-03 NOTE — Progress Notes (Signed)
Has decreased appetite with difficulty chewing/swallowing due to mouth sores and esophageal stricture. Has had 2lb weight loss since last visit. Woke this morning feeling short of breath with dry cough. States has occasional vomiting due to esophageal stricture. Pain is well controlled.

## 2016-01-06 ENCOUNTER — Telehealth: Payer: Self-pay | Admitting: *Deleted

## 2016-01-06 NOTE — Telephone Encounter (Signed)
Spoke with pt's daughter and gave daughter an update on patient's current condition. Informed pt's daughter to call back if has any further questions.

## 2016-01-06 NOTE — Telephone Encounter (Signed)
Daughter requests Dr Mike Gip call her regarding information discussed at office visit that has not previously been said Her number is [phone]

## 2016-01-06 NOTE — Telephone Encounter (Signed)
  Please call patient.  This is Evelyn Willis's patient.  I did not tell her anything new (justr Evelyn Willis's notes).  M

## 2016-01-07 ENCOUNTER — Other Ambulatory Visit: Payer: Self-pay

## 2016-01-07 ENCOUNTER — Emergency Department: Payer: PPO

## 2016-01-07 ENCOUNTER — Encounter: Payer: Self-pay | Admitting: Radiology

## 2016-01-07 ENCOUNTER — Inpatient Hospital Stay
Admission: EM | Admit: 2016-01-07 | Discharge: 2016-01-11 | DRG: 871 | Disposition: A | Discharge status: Hospice | Payer: PPO | Attending: Internal Medicine | Admitting: Internal Medicine

## 2016-01-07 DIAGNOSIS — Z9221 Personal history of antineoplastic chemotherapy: Secondary | ICD-10-CM

## 2016-01-07 DIAGNOSIS — J441 Chronic obstructive pulmonary disease with (acute) exacerbation: Secondary | ICD-10-CM | POA: Diagnosis present

## 2016-01-07 DIAGNOSIS — Z66 Do not resuscitate: Secondary | ICD-10-CM | POA: Diagnosis present

## 2016-01-07 DIAGNOSIS — Z87891 Personal history of nicotine dependence: Secondary | ICD-10-CM | POA: Diagnosis not present

## 2016-01-07 DIAGNOSIS — Z923 Personal history of irradiation: Secondary | ICD-10-CM | POA: Diagnosis not present

## 2016-01-07 DIAGNOSIS — J690 Pneumonitis due to inhalation of food and vomit: Secondary | ICD-10-CM | POA: Diagnosis present

## 2016-01-07 DIAGNOSIS — I4891 Unspecified atrial fibrillation: Secondary | ICD-10-CM | POA: Diagnosis present

## 2016-01-07 DIAGNOSIS — D638 Anemia in other chronic diseases classified elsewhere: Secondary | ICD-10-CM | POA: Diagnosis present

## 2016-01-07 DIAGNOSIS — J9811 Atelectasis: Secondary | ICD-10-CM | POA: Diagnosis present

## 2016-01-07 DIAGNOSIS — G35 Multiple sclerosis: Secondary | ICD-10-CM | POA: Diagnosis present

## 2016-01-07 DIAGNOSIS — C3402 Malignant neoplasm of left main bronchus: Secondary | ICD-10-CM | POA: Diagnosis present

## 2016-01-07 DIAGNOSIS — Z79891 Long term (current) use of opiate analgesic: Secondary | ICD-10-CM | POA: Diagnosis not present

## 2016-01-07 DIAGNOSIS — F329 Major depressive disorder, single episode, unspecified: Secondary | ICD-10-CM | POA: Diagnosis present

## 2016-01-07 DIAGNOSIS — J9621 Acute and chronic respiratory failure with hypoxia: Secondary | ICD-10-CM | POA: Diagnosis present

## 2016-01-07 DIAGNOSIS — E876 Hypokalemia: Secondary | ICD-10-CM | POA: Diagnosis not present

## 2016-01-07 DIAGNOSIS — J189 Pneumonia, unspecified organism: Secondary | ICD-10-CM

## 2016-01-07 DIAGNOSIS — Z515 Encounter for palliative care: Secondary | ICD-10-CM | POA: Diagnosis present

## 2016-01-07 DIAGNOSIS — R6520 Severe sepsis without septic shock: Secondary | ICD-10-CM | POA: Diagnosis present

## 2016-01-07 DIAGNOSIS — Z886 Allergy status to analgesic agent status: Secondary | ICD-10-CM

## 2016-01-07 DIAGNOSIS — C3492 Malignant neoplasm of unspecified part of left bronchus or lung: Secondary | ICD-10-CM

## 2016-01-07 DIAGNOSIS — Z7189 Other specified counseling: Secondary | ICD-10-CM | POA: Diagnosis not present

## 2016-01-07 DIAGNOSIS — C342 Malignant neoplasm of middle lobe, bronchus or lung: Secondary | ICD-10-CM | POA: Diagnosis not present

## 2016-01-07 DIAGNOSIS — I2510 Atherosclerotic heart disease of native coronary artery without angina pectoris: Secondary | ICD-10-CM | POA: Diagnosis present

## 2016-01-07 DIAGNOSIS — A419 Sepsis, unspecified organism: Secondary | ICD-10-CM | POA: Diagnosis present

## 2016-01-07 DIAGNOSIS — R630 Anorexia: Secondary | ICD-10-CM | POA: Diagnosis not present

## 2016-01-07 DIAGNOSIS — R0600 Dyspnea, unspecified: Secondary | ICD-10-CM | POA: Diagnosis not present

## 2016-01-07 DIAGNOSIS — Z96649 Presence of unspecified artificial hip joint: Secondary | ICD-10-CM | POA: Diagnosis present

## 2016-01-07 DIAGNOSIS — Z79899 Other long term (current) drug therapy: Secondary | ICD-10-CM

## 2016-01-07 DIAGNOSIS — R5383 Other fatigue: Secondary | ICD-10-CM | POA: Diagnosis not present

## 2016-01-07 DIAGNOSIS — J9601 Acute respiratory failure with hypoxia: Secondary | ICD-10-CM | POA: Diagnosis not present

## 2016-01-07 DIAGNOSIS — B379 Candidiasis, unspecified: Secondary | ICD-10-CM | POA: Diagnosis present

## 2016-01-07 LAB — COMPREHENSIVE METABOLIC PANEL
ALK PHOS: 103 U/L (ref 38–126)
ALT: 27 U/L (ref 14–54)
ANION GAP: 11 (ref 5–15)
AST: 24 U/L (ref 15–41)
Albumin: 3.1 g/dL — ABNORMAL LOW (ref 3.5–5.0)
BILIRUBIN TOTAL: 0.6 mg/dL (ref 0.3–1.2)
BUN: 17 mg/dL (ref 6–20)
CALCIUM: 9.1 mg/dL (ref 8.9–10.3)
CHLORIDE: 104 mmol/L (ref 101–111)
CO2: 24 mmol/L (ref 22–32)
CREATININE: 0.59 mg/dL (ref 0.44–1.00)
GLUCOSE: 105 mg/dL — AB (ref 65–99)
Potassium: 4 mmol/L (ref 3.5–5.1)
SODIUM: 139 mmol/L (ref 135–145)
Total Protein: 8.1 g/dL (ref 6.5–8.1)

## 2016-01-07 LAB — CBC WITH DIFFERENTIAL/PLATELET
Basophils Absolute: 0.1 10*3/uL (ref 0–0.1)
Basophils Relative: 0 %
EOS ABS: 0.2 10*3/uL (ref 0–0.7)
Eosinophils Relative: 1 %
HCT: 31.6 % — ABNORMAL LOW (ref 35.0–47.0)
HEMOGLOBIN: 10.1 g/dL — AB (ref 12.0–16.0)
LYMPHS ABS: 0.7 10*3/uL — AB (ref 1.0–3.6)
LYMPHS PCT: 3 %
MCH: 26.2 pg (ref 26.0–34.0)
MCHC: 31.8 g/dL — AB (ref 32.0–36.0)
MCV: 82.4 fL (ref 80.0–100.0)
MONOS PCT: 4 %
Monocytes Absolute: 0.8 10*3/uL (ref 0.2–0.9)
NEUTROS PCT: 92 %
Neutro Abs: 20.1 10*3/uL — ABNORMAL HIGH (ref 1.4–6.5)
Platelets: 607 10*3/uL — ABNORMAL HIGH (ref 150–440)
RBC: 3.84 MIL/uL (ref 3.80–5.20)
RDW: 18.2 % — ABNORMAL HIGH (ref 11.5–14.5)
WBC: 21.7 10*3/uL — AB (ref 3.6–11.0)

## 2016-01-07 LAB — LACTIC ACID, PLASMA
LACTIC ACID, VENOUS: 1.3 mmol/L (ref 0.5–1.9)
LACTIC ACID, VENOUS: 2.1 mmol/L — AB (ref 0.5–1.9)

## 2016-01-07 LAB — MAGNESIUM: Magnesium: 1.9 mg/dL (ref 1.7–2.4)

## 2016-01-07 LAB — TROPONIN I: Troponin I: <0.03 ng/mL (ref <0.03)

## 2016-01-07 MED ORDER — IPRATROPIUM-ALBUTEROL 0.5-2.5 (3) MG/3ML IN SOLN
3.0000 mL | RESPIRATORY_TRACT | Status: DC
  Administered 2016-01-08 – 2016-01-11 (×20): 3 mL via RESPIRATORY_TRACT
  Filled 2016-01-07 (×21): qty 3

## 2016-01-07 MED ORDER — ACETAMINOPHEN 325 MG PO TABS: 650.0000 mg | ORAL_TABLET | Freq: Four times a day (QID) | ORAL | Status: DC | PRN

## 2016-01-07 MED ORDER — MIRTAZAPINE 15 MG PO TABS
15.0000 mg | ORAL_TABLET | Freq: Every day | ORAL | Status: DC
  Administered 2016-01-07 – 2016-01-08 (×2): 15 mg via ORAL
  Filled 2016-01-07 (×2): qty 1

## 2016-01-07 MED ORDER — METOPROLOL SUCCINATE ER 25 MG PO TB24
25.0000 mg | ORAL_TABLET | Freq: Every day | ORAL | Status: DC
  Administered 2016-01-08: 25 mg via ORAL
  Filled 2016-01-07: qty 1

## 2016-01-07 MED ORDER — ONDANSETRON HCL 4 MG PO TABS: 4.0000 mg | ORAL_TABLET | Freq: Four times a day (QID) | ORAL | Status: DC | PRN

## 2016-01-07 MED ORDER — AMIODARONE HCL 200 MG PO TABS
200.0000 mg | ORAL_TABLET | Freq: Every day | ORAL | Status: DC
  Administered 2016-01-08: 200 mg via ORAL
  Filled 2016-01-07: qty 1

## 2016-01-07 MED ORDER — SODIUM CHLORIDE 0.9 % IV SOLN
INTRAVENOUS | Status: DC
  Administered 2016-01-07: 21:00:00 via INTRAVENOUS

## 2016-01-07 MED ORDER — MAGIC MOUTHWASH
5.0000 mL | Freq: Four times a day (QID) | ORAL | Status: DC
  Administered 2016-01-08 – 2016-01-10 (×7): 5 mL via ORAL
  Filled 2016-01-07 (×4): qty 10
  Filled 2016-01-07: qty 5
  Filled 2016-01-07 (×3): qty 10

## 2016-01-07 MED ORDER — BUPROPION HCL ER (XL) 150 MG PO TB24
150.0000 mg | ORAL_TABLET | Freq: Every day | ORAL | Status: DC
  Administered 2016-01-08: 150 mg via ORAL
  Filled 2016-01-07 (×2): qty 1

## 2016-01-07 MED ORDER — ENOXAPARIN SODIUM 40 MG/0.4ML ~~LOC~~ SOLN
40.0000 mg | SUBCUTANEOUS | Status: DC
  Administered 2016-01-07 – 2016-01-09 (×3): 40 mg via SUBCUTANEOUS
  Filled 2016-01-07 (×4): qty 0.4

## 2016-01-07 MED ORDER — TIOTROPIUM BROMIDE MONOHYDRATE 18 MCG IN CAPS
18.0000 ug | ORAL_CAPSULE | Freq: Every day | RESPIRATORY_TRACT | Status: DC
  Administered 2016-01-08 – 2016-01-11 (×3): 18 ug via RESPIRATORY_TRACT
  Filled 2016-01-07: qty 5

## 2016-01-07 MED ORDER — DOCUSATE SODIUM 100 MG PO CAPS
100.0000 mg | ORAL_CAPSULE | Freq: Two times a day (BID) | ORAL | Status: DC
  Administered 2016-01-07 – 2016-01-08 (×2): 100 mg via ORAL
  Filled 2016-01-07 (×3): qty 1

## 2016-01-07 MED ORDER — ALBUTEROL (5 MG/ML) CONTINUOUS INHALATION SOLN: 10.0000 mg/h | INHALATION_SOLUTION | RESPIRATORY_TRACT | Status: DC

## 2016-01-07 MED ORDER — MEGESTROL ACETATE 400 MG/10ML PO SUSP
200.0000 mg | Freq: Every day | ORAL | Status: DC
  Administered 2016-01-08: 09:00:00 200 mg via ORAL
  Filled 2016-01-07 (×4): qty 5

## 2016-01-07 MED ORDER — GUAIFENESIN-CODEINE 100-10 MG/5ML PO SOLN
10.0000 mL | ORAL | Status: DC | PRN
  Administered 2016-01-07 – 2016-01-08 (×4): 10 mL via ORAL
  Filled 2016-01-07 (×4): qty 10

## 2016-01-07 MED ORDER — ACETAMINOPHEN 650 MG RE SUPP: 650.0000 mg | Freq: Four times a day (QID) | RECTAL | Status: DC | PRN

## 2016-01-07 MED ORDER — HYDROCODONE-ACETAMINOPHEN 10-325 MG PO TABS
1.0000 | ORAL_TABLET | ORAL | Status: DC | PRN
  Administered 2016-01-07: 22:00:00 1 via ORAL
  Filled 2016-01-07: qty 1

## 2016-01-07 MED ORDER — ONDANSETRON HCL 4 MG/2ML IJ SOLN
4.0000 mg | Freq: Four times a day (QID) | INTRAMUSCULAR | Status: DC | PRN
  Administered 2016-01-08: 4 mg via INTRAVENOUS
  Filled 2016-01-07: qty 2

## 2016-01-07 MED ORDER — SODIUM CHLORIDE 0.9 % IV BOLUS (SEPSIS)
1000.0000 mL | Freq: Once | INTRAVENOUS | Status: AC
  Administered 2016-01-07: 1000 mL via INTRAVENOUS

## 2016-01-07 MED ORDER — ALBUTEROL SULFATE (2.5 MG/3ML) 0.083% IN NEBU
2.5000 mg | INHALATION_SOLUTION | RESPIRATORY_TRACT | Status: AC
  Administered 2016-01-07 (×3): 2.5 mg via RESPIRATORY_TRACT
  Filled 2016-01-07 (×4): qty 3

## 2016-01-07 MED ORDER — SODIUM CHLORIDE 0.9 % IV SOLN: INTRAVENOUS | Status: AC

## 2016-01-07 MED ORDER — CEFEPIME HCL 2 G IJ SOLR
2.0000 g | Freq: Once | INTRAMUSCULAR | Status: AC
  Administered 2016-01-07: 2 g via INTRAVENOUS
  Filled 2016-01-07: qty 2

## 2016-01-07 MED ORDER — FLUDROCORTISONE ACETATE 0.1 MG PO TABS
0.1000 mg | ORAL_TABLET | Freq: Every day | ORAL | Status: DC
  Administered 2016-01-08: 09:00:00 0.1 mg via ORAL
  Filled 2016-01-07: qty 1

## 2016-01-07 MED ORDER — MORPHINE SULFATE ER 15 MG PO TBCR
15.0000 mg | EXTENDED_RELEASE_TABLET | Freq: Two times a day (BID) | ORAL | Status: DC
  Administered 2016-01-07 – 2016-01-08 (×3): 15 mg via ORAL
  Filled 2016-01-07 (×3): qty 1

## 2016-01-07 MED ORDER — IOPAMIDOL (ISOVUE-370) INJECTION 76%
50.0000 mL | Freq: Once | INTRAVENOUS | Status: AC | PRN
  Administered 2016-01-07: 50 mL via INTRAVENOUS

## 2016-01-07 MED ORDER — VANCOMYCIN HCL IN DEXTROSE 1-5 GM/200ML-% IV SOLN
1000.0000 mg | INTRAVENOUS | Status: DC
  Administered 2016-01-08 – 2016-01-10 (×3): 1000 mg via INTRAVENOUS
  Filled 2016-01-07 (×4): qty 200

## 2016-01-07 MED ORDER — OXYCODONE HCL 5 MG PO TABS
5.0000 mg | ORAL_TABLET | ORAL | Status: DC | PRN
Start: 2016-01-07 — End: 2016-01-09

## 2016-01-07 MED ORDER — SODIUM CHLORIDE 0.9 % IV SOLN
1000.0000 mL | INTRAVENOUS | Status: DC
  Administered 2016-01-07: 1000 mL via INTRAVENOUS

## 2016-01-07 MED ORDER — NICOTINE 21 MG/24HR TD PT24
21.0000 mg | MEDICATED_PATCH | Freq: Every day | TRANSDERMAL | Status: DC
  Administered 2016-01-10: 13:00:00 21 mg via TRANSDERMAL
  Filled 2016-01-07 (×2): qty 1

## 2016-01-07 MED ORDER — DEXTROSE 5 % IV SOLN
2.0000 g | Freq: Two times a day (BID) | INTRAVENOUS | Status: DC
  Administered 2016-01-08 (×2): 2 g via INTRAVENOUS
  Filled 2016-01-07 (×5): qty 2

## 2016-01-07 MED ORDER — AMITRIPTYLINE HCL 25 MG PO TABS
75.0000 mg | ORAL_TABLET | Freq: Every day | ORAL | Status: DC
  Administered 2016-01-07 – 2016-01-08 (×2): 75 mg via ORAL
  Filled 2016-01-07 (×2): qty 3

## 2016-01-07 MED ORDER — VANCOMYCIN HCL IN DEXTROSE 1-5 GM/200ML-% IV SOLN
1000.0000 mg | Freq: Once | INTRAVENOUS | Status: AC
  Administered 2016-01-07: 1000 mg via INTRAVENOUS
  Filled 2016-01-07: qty 200

## 2016-01-07 MED ORDER — FIRST-DUKES MOUTHWASH MT SUSP: 5.0000 mL | Freq: Four times a day (QID) | OROMUCOSAL | Status: DC

## 2016-01-07 NOTE — ED Notes (Signed)
Patient transported to X-ray 

## 2016-01-07 NOTE — ED Triage Notes (Signed)
Pt reports SOB Since Monday. Cough productive clear and at times blood tinged history of lung CA last treated Monday. 02 sat 84% RA place on 2L [place] increased to 94%

## 2016-01-07 NOTE — Progress Notes (Signed)
Pharmacy Antibiotic Note  Evelyn Willis is a 60 y.o. female admitted on 01/07/2016 with sepsis.  Pharmacy has been consulted for vancomycin and cefepime dosing.  Plan: Cefepime 2 g IV q12h  Vancomycin 1000 mg once followed by 1000 mg IV q24h (stacked dose given 12 hours after initial dose) Goal vancomycin trough 15-20 mcg/mL Vancomycin trough 8/15 @ 0530  Kinetics: Using actual body weight of 48 kg Ke: 0.054 Half-life: 13 hrs   Height: '5\' 6"'$  (167.6 cm) Weight: 106 lb 12.8 oz (48.4 kg) IBW/kg (Calculated) : 59.3  Temp (24hrs), Avg:99.4 F (37.4 C), Min:98.4 F (36.9 C), Max:100.3 F (37.9 C)   Recent Labs Lab 01/03/16 1027 01/07/16 1743 01/07/16 1847  WBC 12.6* 21.7*  --   CREATININE 0.77 0.59  --   LATICACIDVEN  --   --  1.3    Estimated Creatinine Clearance: 56.4 mL/min (by C-G formula based on SCr of 0.8 mg/dL).    Allergies  Allergen Reactions  . Aspirin Other (See Comments)    Pt gets bleeding ulcers when she takes this medication Bleeding ulcers  . Nsaids Other (See Comments)    Other reaction(s): Other (See Comments) Cannot take due to significant peptic ulcer disease. Cannot take due to significant peptic ulcer disease.    Antimicrobials this admission: vancomycin 8/12 >>  cefepime 8/12 >>   Dose adjustments this admission:  Microbiology results: 8/12 BCx: Sent   Thank you for allowing pharmacy to be a part of this patient's care.  Lenis Noon, PharmD 01/07/2016 9:11 PM

## 2016-01-07 NOTE — ED Provider Notes (Signed)
[facility] Emergency Department Provider Note    First MD Initiated Contact with Patient 01/07/16 1732     (approximate)  I have reviewed the triage vital signs and the nursing notes.   HISTORY  Chief Complaint Shortness of Breath    HPI Evelyn Willis is a 60 y.o. female with history of left lung cancer undergoing immunotherapy as well as a history of COPD and CADpresents with 1 week of gradually worsening cough and shortness of breath. Patient states that symptoms became acutely worse last night and had several episodes of blood tinged productive cough and sputum. She tablet does not wear home oxygen. States that she is having some chest pain largely secondary to the coughing. Also admits to intermittent chills. Patient arrives to the ED and was hypoxic to the mid 70s which improved to the mid 90s on 2 L nasal cannula. She is also noted to be tachycardic with markedly tachypnea. Denies any lower extremity swelling. She's not on any anticoagulation.   Past Medical History:  Diagnosis Date  . Atrial fibrillation ([place])   . Cancer [facility]) 2016   Left lung cancer, radiation, chemo  . COPD (chronic obstructive pulmonary disease) ([place])   . Coronary artery disease    said- stent was placed 10 yrs ago from now( 2017) in [place]  . Depression   . Emphysema lung ([place])   . Hematuria   . Multiple sclerosis ([place])   . Renal calculi     Patient Active Problem List   Diagnosis Date Noted  . Anemia 01/03/2016  . Peptic ulcer disease 12/20/2015  . Tobacco abuse 12/20/2015  . Trouble swallowing 12/15/2015  . Swallowing difficulty 12/15/2015  . Collapse of left lung 10/23/2015  . Postobstructive pneumonia 10/23/2015  . Pneumonia 10/23/2015  . Gross hematuria 09/23/2015  . CAD (coronary atherosclerotic disease) 08/15/2015  . Hematuria 08/15/2015  . Malignant neoplasm of hilus of left lung ([place])   . Depression   . Relapsing remitting multiple sclerosis  ([place])   . Tachycardia   . Shortness of breath   . Encounter for anticoagulation discussion and counseling   . Smoker   . Centrilobular emphysema ([place])   . Atrial fibrillation with rapid ventricular response ([place]) 08/06/2015  . COPD, mild ([place]) 07/19/2015  . Pain medication agreement signed 05/31/2015  . Chronic fatigue 04/27/2014  . Insomnia, unspecified 04/27/2014  . Arthralgia 04/29/2013  . Chronic pain syndrome 04/29/2013  . Encounter for long-term (current) use of other medications 04/29/2013  . Chronic pain 09/12/2012    Past Surgical History:  Procedure Laterality Date  . EXTRACORPOREAL SHOCK WAVE LITHOTRIPSY Right 11/03/2015   Procedure: EXTRACORPOREAL SHOCK WAVE LITHOTRIPSY (ESWL);  Surgeon: Hollice Espy, MD;  Location: [facility] ORS;  Service: Urology;  Laterality: Right;  . stomach ulcer removal  2011  . TOTAL HIP ARTHROPLASTY  2011    Prior to Admission medications   Medication Sig Start Date End Date Taking? Authorizing Provider  albuterol (PROVENTIL HFA;VENTOLIN HFA) 108 (90 Base) MCG/ACT inhaler Inhale into the lungs. 07/06/15   Historical Provider, MD  amiodarone (PACERONE) 200 MG tablet Take 200 mg by mouth. 08/13/15   Historical Provider, MD  amitriptyline (ELAVIL) 75 MG tablet  11/24/15   Historical Provider, MD  buPROPion (WELLBUTRIN XL) 150 MG 24 hr tablet Take by mouth. 07/19/15   Historical Provider, MD  Diphenhyd-Hydrocort-Nystatin (FIRST-DUKES MOUTHWASH) SUSP Use as directed 5 mLs in the mouth or throat 4 (four) times daily. 12/26/15   Christia Reading  Bridget Hartshorn, MD  docusate sodium (COLACE) 100 MG capsule Take 1 capsule (100 mg total) by mouth 2 (two) times daily. 11/03/15   Hollice Espy, MD  fludrocortisone (FLORINEF) 0.1 MG tablet Take 1 tablet (0.1 mg total) by mouth daily. 11/16/15   Minna Merritts, MD  HYDROcodone-acetaminophen [facility]) 10-325 MG tablet  12/02/15   Historical Provider, MD  lidocaine-prilocaine (EMLA) cream Apply to affected area once 11/17/15   Lloyd Huger, MD  megestrol (MEGACE) 400 MG/10ML suspension Take 5 mLs (200 mg total) by mouth daily. 01/03/16   Lequita Asal, MD  metoprolol succinate (TOPROL-XL) 25 MG 24 hr tablet Take 25 mg by mouth. 08/15/15   Historical Provider, MD  mirtazapine (REMERON) 15 MG tablet TAKE 1 TABLET BY MOUTH AT BEDTIME 11/24/15   Historical Provider, MD  morphine (MS CONTIN) 15 MG 12 hr tablet Take by mouth.    Historical Provider, MD  Naloxone HCl (NARCAN NA) Place into the nose. 12/02/15   Historical Provider, MD  nicotine (NICODERM CQ - DOSED IN MG/24 HOURS) 21 mg/24hr patch Place 21 mg onto the skin daily.    Historical Provider, MD  ondansetron (ZOFRAN) 4 MG tablet  11/08/15   Historical Provider, MD  tiotropium (SPIRIVA) 18 MCG inhalation capsule Place into inhaler and inhale. 11/05/14   Historical Provider, MD    Allergies Aspirin and Nsaids  Family History  Problem Relation Age of Onset  . CAD Mother   . Atrial fibrillation Mother   . Heart attack Neg Hx   . Hypertension Neg Hx   . Stroke Neg Hx   . Kidney disease Neg Hx     Social History Social History  Substance Use Topics  . Smoking status: Former Smoker    Packs/day: 1.50    Years: 45.00    Quit date: 07/26/2015  . Smokeless tobacco: Not on file     Comment: x 2 weeks  . Alcohol use 0.0 oz/week    Review of Systems Patient denies headaches, rhinorrhea, blurry vision, numbness, shortness of breath, chest pain, edema, cough, abdominal pain, nausea, vomiting, diarrhea, dysuria, fevers, rashes or hallucinations unless otherwise stated above in HPI. ____________________________________________   PHYSICAL EXAM:  VITAL SIGNS: Vitals:   01/07/16 1736  BP: 115/67  Pulse: (!) 112  Resp: (!) 24  Temp: 98.4 F (36.9 C)    Constitutional: Alert and oriented. Ill appearing, cachectic and in moderate respiratory distress Eyes: Conjunctivae are normal. PERRL. EOMI. Head: Atraumatic. Nose: No congestion/rhinnorhea. Mouth/Throat:  Mucous membranes are moist.  Oropharynx non-erythematous. Neck: No stridor. Painless ROM. No cervical spine tenderness to palpation Hematological/Lymphatic/Immunilogical: No cervical lymphadenopathy. Cardiovascular: Tachycardic, regular rhythm. Grossly normal heart sounds.  Good peripheral circulation. Respiratory: Coarse breath sounds in the right posterior lung fields, diminished breath sounds on left lung fields but expiratory wheezing is appreciated. Her eyes. Gastrointestinal: Soft and nontender. No distention. No abdominal bruits. No CVA tenderness. Genitourinary:  Musculoskeletal: No lower extremity tenderness nor edema.  No joint effusions. Neurologic:  Normal speech and language. No gross focal neurologic deficits are appreciated. No gait instability. Skin:  Skin is warm, dry and intact. No rash noted.  ____________________________________________   LABS (all labs ordered are listed, but only abnormal results are displayed)  Results for orders placed or performed during the hospital encounter of 01/07/16 (from the past 24 hour(s))  CBC with Differential/Platelet     Status: Abnormal   Collection Time: 01/07/16  5:43 PM  Result Value Ref Range  WBC 21.7 (H) 3.6 - 11.0 K/uL   RBC 3.84 3.80 - 5.20 MIL/uL   Hemoglobin 10.1 (L) 12.0 - 16.0 g/dL   HCT 31.6 (L) 35.0 - 47.0 %   MCV 82.4 80.0 - 100.0 fL   MCH 26.2 26.0 - 34.0 pg   MCHC 31.8 (L) 32.0 - 36.0 g/dL   RDW 18.2 (H) 11.5 - 14.5 %   Platelets 607 (H) 150 - 440 K/uL   Neutrophils Relative % 92 %   Neutro Abs 20.1 (H) 1.4 - 6.5 K/uL   Lymphocytes Relative 3 %   Lymphs Abs 0.7 (L) 1.0 - 3.6 K/uL   Monocytes Relative 4 %   Monocytes Absolute 0.8 0.2 - 0.9 K/uL   Eosinophils Relative 1 %   Eosinophils Absolute 0.2 0 - 0.7 K/uL   Basophils Relative 0 %   Basophils Absolute 0.1 0 - 0.1 K/uL   ____________________________________________  EKG  Time: 17:43  Indication: SOB  Rate: 110  Rhythm: sinus Axis: right Other:  RBBBm nonspecific ST changes ____________________________________________  RADIOLOGY  Chest x-ray with no pneumothorax but evidence of interval development of left lung opacity ____________________________________________   PROCEDURES  Procedure(s) performed: none    Critical Care performed: yes CRITICAL CARE Performed by: Merlyn Lot   Total critical care time: 42 minutes  Critical care time was exclusive of separately billable procedures and treating other patients.  Critical care was necessary to treat or prevent imminent or life-threatening deterioration.  Critical care was time spent personally by me on the following activities: development of treatment plan with patient and/or surrogate as well as nursing, discussions with consultants, evaluation of patient's response to treatment, examination of patient, obtaining history from patient or surrogate, ordering and performing treatments and interventions, ordering and review of laboratory studies, ordering and review of radiographic studies, pulse oximetry and re-evaluation of patient's condition.  ____________________________________________   INITIAL IMPRESSION / ASSESSMENT AND PLAN / ED COURSE  Pertinent labs & imaging results that were available during my care of the patient were reviewed by me and considered in my medical decision making (see chart for details).  DDX: Pneumothorax, pneumonia, congestive heart failure, pulmonary embolism, sepsis, dehydration, COPD  KIMBERLEIGH MEHAN is a 60 y.o. who presents to the ED with acute hypoxic respiratory failure complicated bypass medical history of lung cancer undergoing active immunotherapy as well as underlying CAD and COPD.  Patient arrives in acute respiratory distress with acute hypoxia and tachycardia. Symptoms improved with supplemental oxygen as well as emergent nebulizer treatment. Stat EKG does not show any evidence of acute ischemia. We'll order a chest x-ray to  evaluate for underlying infectious process. Patient has equal chest rise therefore a lower suspicion for pneumothorax. The remainder physical exam is less consistent with congestive heart failure.  Clinical Course  Comment By Time  Reviewed x-ray imaging and given acute opacity in the left lung fields associated with marked leukocytosis and acute hypoxic respiratory failure.  I will call a code sepsis on this patient and have ordered breasts which on antibiotics as the patient is immunocompromised. Hypoxia still improve the patient still to Neck and short of breath. She does not have a markedly acidosis and her troponin is negative. I have ordered a CTA of the chest to evaluate for a pulmonary embolism.Given the patient's acute presentation with acute respiratory failure with hypoxia requiring supplemental oxygen and broad-spectrum antibiotics for severe sepsis I feel that the patient will require admission to hospital for further evaluation and  management.  Have discussed with the patient and available family all diagnostics and treatments performed thus far and all questions were answered to the best of my ability. The patient demonstrates understanding and agreement with plan.  Merlyn Lot, MD 08/12 1827     ____________________________________________   FINAL CLINICAL IMPRESSION(S) / ED DIAGNOSES  Final diagnoses:  Acute respiratory failure with hypoxia ([place])  Severe sepsis ([place])  CAP (community acquired pneumonia)  Malignant neoplasm of left lung, unspecified part of lung ([place])      NEW MEDICATIONS STARTED DURING THIS VISIT:  New Prescriptions   No medications on file     Note:  This document was prepared using Dragon voice recognition software and may include unintentional dictation errors.    Merlyn Lot, MD 01/07/16 [phone]

## 2016-01-07 NOTE — H&P (Signed)
[place] [facility] [place] NAME: Evelyn Willis    MR#:  [id number]  DATE OF BIRTH:  12-27-1955   DATE OF ADMISSION:  01/07/2016  PRIMARY CARE PHYSICIAN: BABAOFF, Caryl Bis, MD   REQUESTING/REFERRING PHYSICIAN: Quentin Cornwall  CHIEF COMPLAINT:   Chief Complaint  Patient presents with  . Shortness of Breath    HISTORY OF PRESENT ILLNESS:  Evelyn Willis  is a 60 y.o. female with a known history of Lung cancer on active treatment presenting with shortness of breath and cough. She states 2 day duration of continuing cough with associated posttussive emesis. Cough productive green sputum denies fevers chills positive fatigue and weakness. Associated shortness of breath with activity. Meeting septic criteria on admission code sepsis called.  PAST MEDICAL HISTORY:   Past Medical History:  Diagnosis Date  . Atrial fibrillation ([place])   . Cancer [facility]) 2016   Left lung cancer, radiation, chemo  . COPD (chronic obstructive pulmonary disease) ([place])   . Coronary artery disease    said- stent was placed 10 yrs ago from now( 2017) in [place], [place]  . Depression   . Emphysema lung ([place])   . Hematuria   . Multiple sclerosis ([place])   . Renal calculi     PAST SURGICAL HISTORY:   Past Surgical History:  Procedure Laterality Date  . EXTRACORPOREAL SHOCK WAVE LITHOTRIPSY Right 11/03/2015   Procedure: EXTRACORPOREAL SHOCK WAVE LITHOTRIPSY (ESWL);  Surgeon: Hollice Espy, MD;  Location: [facility] ORS;  Service: Urology;  Laterality: Right;  . stomach ulcer removal  2011  . TOTAL HIP ARTHROPLASTY  2011    SOCIAL HISTORY:   Social History  Substance Use Topics  . Smoking status: Former Smoker    Packs/day: 1.50    Years: 45.00    Quit date: 07/26/2015  . Smokeless tobacco: Not on file     Comment: x 2 weeks  . Alcohol use 0.0 oz/week    FAMILY HISTORY:   Family History  Problem Relation Age of Onset  . CAD Mother   . Atrial fibrillation Mother   . Heart  attack Neg Hx   . Hypertension Neg Hx   . Stroke Neg Hx   . Kidney disease Neg Hx     DRUG ALLERGIES:   Allergies  Allergen Reactions  . Aspirin Other (See Comments)    Pt gets bleeding ulcers when she takes this medication Bleeding ulcers  . Nsaids Other (See Comments)    Other reaction(s): Other (See Comments) Cannot take due to significant peptic ulcer disease. Cannot take due to significant peptic ulcer disease.    REVIEW OF SYSTEMS:  REVIEW OF SYSTEMS:  CONSTITUTIONAL: Denies fevers, chills, Positive fatigue, weakness. Positive weight loss EYES: Denies blurred vision, double vision, or eye pain.  EARS, NOSE, THROAT: Denies tinnitus, ear pain, hearing loss.  RESPIRATORY: Positive cough, shortness of breath, denies wheezing  CARDIOVASCULAR: Denies chest pain, palpitations, edema.  GASTROINTESTINAL: Denies nausea, vomiting, diarrhea, abdominal pain.  GENITOURINARY: Denies dysuria, hematuria.  ENDOCRINE: Denies nocturia or thyroid problems. HEMATOLOGIC AND LYMPHATIC: Denies easy bruising or bleeding.  SKIN: Denies rash or lesions.  MUSCULOSKELETAL: Denies pain in neck, back, shoulder, knees, hips, or further arthritic symptoms.  NEUROLOGIC: Denies paralysis, paresthesias.  PSYCHIATRIC: Denies anxiety or depressive symptoms. Otherwise full review of systems performed by me is negative.   MEDICATIONS AT HOME:   Prior to Admission medications   Medication Sig Start Date End Date Taking? Authorizing Provider  albuterol (PROVENTIL HFA;VENTOLIN  HFA) 108 (90 Base) MCG/ACT inhaler Inhale into the lungs. 07/06/15   Historical Provider, MD  amiodarone (PACERONE) 200 MG tablet Take 200 mg by mouth. 08/13/15   Historical Provider, MD  amitriptyline (ELAVIL) 75 MG tablet  11/24/15   Historical Provider, MD  buPROPion (WELLBUTRIN XL) 150 MG 24 hr tablet Take by mouth. 07/19/15   Historical Provider, MD  Diphenhyd-Hydrocort-Nystatin (FIRST-DUKES MOUTHWASH) SUSP Use as directed 5 mLs in the  mouth or throat 4 (four) times daily. 12/26/15   Lloyd Huger, MD  docusate sodium (COLACE) 100 MG capsule Take 1 capsule (100 mg total) by mouth 2 (two) times daily. 11/03/15   Hollice Espy, MD  fludrocortisone (FLORINEF) 0.1 MG tablet Take 1 tablet (0.1 mg total) by mouth daily. 11/16/15   Minna Merritts, MD  HYDROcodone-acetaminophen [facility]) 10-325 MG tablet  12/02/15   Historical Provider, MD  lidocaine-prilocaine (EMLA) cream Apply to affected area once 11/17/15   Lloyd Huger, MD  megestrol (MEGACE) 400 MG/10ML suspension Take 5 mLs (200 mg total) by mouth daily. 01/03/16   Lequita Asal, MD  metoprolol succinate (TOPROL-XL) 25 MG 24 hr tablet Take 25 mg by mouth. 08/15/15   Historical Provider, MD  mirtazapine (REMERON) 15 MG tablet TAKE 1 TABLET BY MOUTH AT BEDTIME 11/24/15   Historical Provider, MD  morphine (MS CONTIN) 15 MG 12 hr tablet Take by mouth.    Historical Provider, MD  Naloxone HCl (NARCAN NA) Place into the nose. 12/02/15   Historical Provider, MD  nicotine (NICODERM CQ - DOSED IN MG/24 HOURS) 21 mg/24hr patch Place 21 mg onto the skin daily.    Historical Provider, MD  ondansetron (ZOFRAN) 4 MG tablet  11/08/15   Historical Provider, MD  tiotropium (SPIRIVA) 18 MCG inhalation capsule Place into inhaler and inhale. 11/05/14   Historical Provider, MD      VITAL SIGNS:  Blood pressure 115/67, pulse (!) 112, temperature 98.4 F (36.9 C), temperature source Oral, resp. rate (!) 24, height '5\' 6"'$  (1.676 m), weight 45.4 kg (100 lb), SpO2 (!) 82 %.  PHYSICAL EXAMINATION:  VITAL SIGNS: Vitals:   01/07/16 1736  BP: 115/67  Pulse: (!) 112  Resp: (!) 24  Temp: 98.4 F (36.9 C)   GENERAL:60 y.o.female currently in no acute distress.Frail appearing  HEAD: Normocephalic, atraumatic.  EYES: Pupils equal, round, reactive to light. Extraocular muscles intact. No scleral icterus.  MOUTH: Moist mucosal membrane. Dentition intact. No abscess noted.  EAR, NOSE, THROAT: Clear  without exudates. No external lesions.  NECK: Supple. No thyromegaly. No nodules. No JVD.  PULMONARY: Diffuse coarse rhonchi right sided without wheeze No use of accessory muscles, Good respiratory effort. good air entry bilaterally CHEST: Nontender to palpation.  CARDIOVASCULAR: S1 and S2. Regular rate and rhythm. No murmurs, rubs, or gallops. No edema. Pedal pulses 2+ bilaterally.  GASTROINTESTINAL: Soft, nontender, nondistended. No masses. Positive bowel sounds. No hepatosplenomegaly.  MUSCULOSKELETAL: No swelling, clubbing, or edema. Range of motion full in all extremities.  NEUROLOGIC: Cranial nerves II through XII are intact. No gross focal neurological deficits. Sensation intact. Reflexes intact.  SKIN: No ulceration, lesions, rashes, or cyanosis. Skin warm and dry. Turgor intact.  PSYCHIATRIC: Mood, affect within normal limits. The patient is awake, alert and oriented x 3. Insight, judgment intact.    LABORATORY PANEL:   CBC  Recent Labs Lab 01/07/16 1743  WBC 21.7*  HGB 10.1*  HCT 31.6*  PLT 607*   ------------------------------------------------------------------------------------------------------------------  Chemistries   Recent Labs  Lab 01/07/16 1743  NA 139  K 4.0  CL 104  CO2 24  GLUCOSE 105*  BUN 17  CREATININE 0.59  CALCIUM 9.1  MG 1.9  AST 24  ALT 27  ALKPHOS 103  BILITOT 0.6   ------------------------------------------------------------------------------------------------------------------  Cardiac Enzymes  Recent Labs Lab 01/07/16 1743  TROPONINI <0.03   ------------------------------------------------------------------------------------------------------------------  RADIOLOGY:  Dg Chest 2 View  Result Date: 01/07/2016 CLINICAL DATA:  Acute shortness of breath with cough. History of lung carcinoma. EXAM: CHEST  2 VIEW COMPARISON:  10/24/2015 FINDINGS: There is no longer complete opacification of left hemi thorax. There is left perihilar  and lower lung zone coarse reticular opacity consistent with treatment related scarring. Superimposed infection is possible. Right lung is hyperexpanded. There are irregularly thickened interstitial markings in a most notable the right lung base, similar to the prior study. There is some hazy airspace opacity in the right medial lung base projecting in the right middle lobe on the lateral view. Minimal left pleural effusion.  No right pleural effusion. No pneumothorax. Cardiac silhouette is normal in size.  No mediastinal masses. IMPRESSION: 1. Hazy opacity the right medial lung base suggests superimposed infection on chronic interstitial thickening. 2. Opacity in the left perihilar and lower lung zones likely treatment related scarring. Superimposed pneumonia is possible. 3. Minimal left pleural effusion. 4. No pulmonary edema. Electronically Signed   By: Lajean Manes M.D.   On: 01/07/2016 18:08    EKG:   Orders placed or performed during the hospital encounter of 01/07/16  . ED EKG  . ED EKG    IMPRESSION AND PLAN:   60 year old Caucasian female history of lung cancer not on home oxygen presenting with shortness of breath cough  1. Acute on chronic respiratory failure with hypoxia secondary to pneumonia. Received vancomycin/cefepime in the emergency department continue these follow culture data and adjust accordingly, breathing treatments, oxygen, consult pulmonary and she follows with them as outpatient 2.Sepsis, meeting septic criteria by leukocytosis, heart rate, respiratory rate present on arrival. Source community-acquired pneumonia Code sepsis initiated. Panculture. Broad-spectrum antibiotics as above and taper antibiotics when culture data returns.  Has received a 30 mL/kg IV fluid bolus. Continue IV fluid hydration to keep mean arterial pressure greater than 65. may require pressor therapy if blood pressure worsens. We will repeat lactic acid if the initial is greater than 2.2.  3. History  of lung cancer: Continue pain medication, consult oncology for routine follow through     All the records are reviewed and case discussed with ED provider. Management plans discussed with the patient, family and they are in agreement.  CODE STATUS: Full  TOTAL TIME TAKING CARE OF THIS PATIENT: 45 minutes.    Ishaaq Penna,  Karenann Cai.D on 01/07/2016 at 7:01 PM  Between 7am to 6pm - Pager - [phone]  After 6pm: House Pager: - [place]  Office  [phone]  CC: Primary care physician; BABAOFF, Caryl Bis, MD

## 2016-01-08 ENCOUNTER — Encounter: Payer: Self-pay | Admitting: Hematology and Oncology

## 2016-01-08 DIAGNOSIS — D649 Anemia, unspecified: Secondary | ICD-10-CM

## 2016-01-08 DIAGNOSIS — B370 Candidal stomatitis: Secondary | ICD-10-CM

## 2016-01-08 DIAGNOSIS — R5383 Other fatigue: Secondary | ICD-10-CM

## 2016-01-08 DIAGNOSIS — R6520 Severe sepsis without septic shock: Secondary | ICD-10-CM

## 2016-01-08 DIAGNOSIS — Z7951 Long term (current) use of inhaled steroids: Secondary | ICD-10-CM

## 2016-01-08 DIAGNOSIS — J90 Pleural effusion, not elsewhere classified: Secondary | ICD-10-CM

## 2016-01-08 DIAGNOSIS — J189 Pneumonia, unspecified organism: Secondary | ICD-10-CM

## 2016-01-08 DIAGNOSIS — G35 Multiple sclerosis: Secondary | ICD-10-CM

## 2016-01-08 DIAGNOSIS — Z9221 Personal history of antineoplastic chemotherapy: Secondary | ICD-10-CM

## 2016-01-08 DIAGNOSIS — Z87442 Personal history of urinary calculi: Secondary | ICD-10-CM

## 2016-01-08 DIAGNOSIS — Z79899 Other long term (current) drug therapy: Secondary | ICD-10-CM

## 2016-01-08 DIAGNOSIS — R630 Anorexia: Secondary | ICD-10-CM

## 2016-01-08 DIAGNOSIS — J441 Chronic obstructive pulmonary disease with (acute) exacerbation: Secondary | ICD-10-CM

## 2016-01-08 DIAGNOSIS — A419 Sepsis, unspecified organism: Principal | ICD-10-CM

## 2016-01-08 DIAGNOSIS — I2510 Atherosclerotic heart disease of native coronary artery without angina pectoris: Secondary | ICD-10-CM

## 2016-01-08 DIAGNOSIS — J449 Chronic obstructive pulmonary disease, unspecified: Secondary | ICD-10-CM

## 2016-01-08 DIAGNOSIS — D72829 Elevated white blood cell count, unspecified: Secondary | ICD-10-CM

## 2016-01-08 DIAGNOSIS — Z923 Personal history of irradiation: Secondary | ICD-10-CM

## 2016-01-08 DIAGNOSIS — I4891 Unspecified atrial fibrillation: Secondary | ICD-10-CM

## 2016-01-08 DIAGNOSIS — F329 Major depressive disorder, single episode, unspecified: Secondary | ICD-10-CM

## 2016-01-08 DIAGNOSIS — C342 Malignant neoplasm of middle lobe, bronchus or lung: Secondary | ICD-10-CM

## 2016-01-08 DIAGNOSIS — J9811 Atelectasis: Secondary | ICD-10-CM

## 2016-01-08 DIAGNOSIS — Z87891 Personal history of nicotine dependence: Secondary | ICD-10-CM

## 2016-01-08 LAB — CBC
HEMATOCRIT: 23 % — AB (ref 35.0–47.0)
HEMOGLOBIN: 8.1 g/dL — AB (ref 12.0–16.0)
MCH: 26.1 pg (ref 26.0–34.0)
MCHC: 31.3 g/dL — ABNORMAL LOW (ref 32.0–36.0)
MCV: 83.3 fL (ref 80.0–100.0)
Platelets: 447 10*3/uL — ABNORMAL HIGH (ref 150–440)
RBC: 2.76 MIL/uL — ABNORMAL LOW (ref 3.80–5.20)
RDW: 18.3 % — ABNORMAL HIGH (ref 11.5–14.5)
WBC: 16.4 10*3/uL — AB (ref 3.6–11.0)

## 2016-01-08 LAB — URINALYSIS COMPLETE WITH MICROSCOPIC (ARMC ONLY)
BACTERIA UA: NONE SEEN
BILIRUBIN URINE: NEGATIVE
GLUCOSE, UA: NEGATIVE mg/dL
Ketones, ur: NEGATIVE mg/dL
Nitrite: NEGATIVE
Protein, ur: NEGATIVE mg/dL
SPECIFIC GRAVITY, URINE: 1.035 — AB (ref 1.005–1.030)
pH: 5 (ref 5.0–8.0)

## 2016-01-08 LAB — BASIC METABOLIC PANEL
ANION GAP: 6 (ref 5–15)
BUN: 11 mg/dL (ref 6–20)
CHLORIDE: 110 mmol/L (ref 101–111)
CO2: 23 mmol/L (ref 22–32)
Calcium: 7.7 mg/dL — ABNORMAL LOW (ref 8.9–10.3)
Creatinine, Ser: 0.45 mg/dL (ref 0.44–1.00)
GFR calc Af Amer: >60 mL/min (ref >60)
Glucose, Bld: 109 mg/dL — ABNORMAL HIGH (ref 65–99)
POTASSIUM: 3.4 mmol/L — AB (ref 3.5–5.1)
SODIUM: 139 mmol/L (ref 135–145)

## 2016-01-08 LAB — LACTIC ACID, PLASMA: Lactic Acid, Venous: 1.1 mmol/L (ref 0.5–1.9)

## 2016-01-08 MED ORDER — BUDESONIDE 0.5 MG/2ML IN SUSP
0.5000 mg | Freq: Two times a day (BID) | RESPIRATORY_TRACT | Status: DC
  Administered 2016-01-08 – 2016-01-11 (×6): 0.5 mg via RESPIRATORY_TRACT
  Filled 2016-01-08 (×6): qty 2

## 2016-01-08 MED ORDER — LORAZEPAM 2 MG/ML IJ SOLN
0.5000 mg | INTRAMUSCULAR | Status: DC | PRN
  Administered 2016-01-08 – 2016-01-10 (×9): 0.5 mg via INTRAVENOUS
  Filled 2016-01-08 (×8): qty 1

## 2016-01-08 MED ORDER — METHYLPREDNISOLONE SODIUM SUCC 40 MG IJ SOLR
40.0000 mg | Freq: Two times a day (BID) | INTRAMUSCULAR | Status: DC
  Administered 2016-01-08 – 2016-01-11 (×6): 40 mg via INTRAVENOUS
  Filled 2016-01-08 (×5): qty 1

## 2016-01-08 MED ORDER — ALBUTEROL SULFATE (2.5 MG/3ML) 0.083% IN NEBU
2.5000 mg | INHALATION_SOLUTION | RESPIRATORY_TRACT | Status: DC | PRN
  Administered 2016-01-10 – 2016-01-11 (×2): 2.5 mg via RESPIRATORY_TRACT
  Filled 2016-01-08 (×2): qty 3

## 2016-01-08 MED ORDER — POTASSIUM CHLORIDE 10 MEQ/100ML IV SOLN
10.0000 meq | INTRAVENOUS | Status: AC
  Administered 2016-01-08: 10 meq via INTRAVENOUS
  Filled 2016-01-08: qty 100

## 2016-01-08 MED ORDER — SODIUM CHLORIDE 0.9 % IV BOLUS (SEPSIS)
250.0000 mL | Freq: Once | INTRAVENOUS | Status: AC
  Administered 2016-01-08: 250 mL via INTRAVENOUS

## 2016-01-08 MED ORDER — SODIUM CHLORIDE 0.9 % IV BOLUS (SEPSIS)
250.0000 mL | Freq: Once | INTRAVENOUS | Status: AC
  Administered 2016-01-08: 22:00:00 250 mL via INTRAVENOUS

## 2016-01-08 MED ORDER — LORAZEPAM 2 MG/ML IJ SOLN
INTRAMUSCULAR | Status: AC
  Administered 2016-01-08: 03:00:00 0.5 mg via INTRAVENOUS
  Filled 2016-01-08: qty 1

## 2016-01-08 NOTE — H&P (Signed)
[facility]   PATIENT NAME: Evelyn Willis    MR#:  [id number]  DATE OF BIRTH:  1955-11-11   DATE OF ADMISSION:  01/07/2016   REQUESTING/REFERRING PHYSICIAN: Mody CHIEF COMPLAINT:   Chief Complaint  Patient presents with  . Shortness of Breath  increased WOb and cough  HISTORY OF PRESENT ILLNESS:  Evelyn Willis  is a 60 y.o. female with a known history of Lung cancer Stage 2A,  - on active chemo treatment presenting with shortness of breath and cough.  -She states 2 day duration of continuing cough with associated posttussive emesis.  -Cough productive green sputum denies fevers chills positive fatigue and weakness. -code sepsis was started in ER -PFT from 11/04/15, FEV1 51%, air trapping, DLCO equals 51%, consistent with moderate COPD, on the cusp of becoming severe.  PAST MEDICAL HISTORY:   Past Medical History:  Diagnosis Date  . Atrial fibrillation ([place])   . Cancer [facility]) 2016   Left lung cancer, radiation, chemo  . COPD (chronic obstructive pulmonary disease) ([place])   . Coronary artery disease    said- stent was placed 10 yrs ago from now( 2017) in [place], [place]  . Depression   . Emphysema lung ([place])   . Hematuria   . Multiple sclerosis ([place])   . Renal calculi     PAST SURGICAL HISTORY:   Past Surgical History:  Procedure Laterality Date  . EXTRACORPOREAL SHOCK WAVE LITHOTRIPSY Right 11/03/2015   Procedure: EXTRACORPOREAL SHOCK WAVE LITHOTRIPSY (ESWL);  Surgeon: Hollice Espy, MD;  Location: [facility] ORS;  Service: Urology;  Laterality: Right;  . stomach ulcer removal  2011  . TOTAL HIP ARTHROPLASTY  2011    SOCIAL HISTORY:   Social History  Substance Use Topics  . Smoking status: Former Smoker    Packs/day: 1.50    Years: 45.00    Quit date: 07/26/2015  . Smokeless tobacco: Former Systems developer     Comment: x 2 weeks  . Alcohol use 0.0 oz/week    FAMILY HISTORY:   Family History  Problem Relation Age of Onset  . CAD Mother   . Atrial  fibrillation Mother   . Heart attack Neg Hx   . Hypertension Neg Hx   . Stroke Neg Hx   . Kidney disease Neg Hx     DRUG ALLERGIES:   Allergies  Allergen Reactions  . Aspirin Other (See Comments)    Pt gets bleeding ulcers when she takes this medication Bleeding ulcers  . Nsaids Other (See Comments)    Other reaction(s): Other (See Comments) Cannot take due to significant peptic ulcer disease. Cannot take due to significant peptic ulcer disease.    REVIEW OF SYSTEMS:  REVIEW OF SYSTEMS:  CONSTITUTIONAL: Denies fevers, chills, Positive fatigue, weakness. Positive weight loss EYES: Denies blurred vision, double vision, or eye pain.  EARS, NOSE, THROAT: Denies tinnitus, ear pain, hearing loss.  RESPIRATORY: Positive cough, +shortness of breath, +wheezing  CARDIOVASCULAR: Denies chest pain, palpitations, edema.  GASTROINTESTINAL: Denies nausea, vomiting, diarrhea, abdominal pain.  GENITOURINARY: Denies dysuria, hematuria.  ENDOCRINE: Denies nocturia or thyroid problems. HEMATOLOGIC AND LYMPHATIC: Denies easy bruising or bleeding.  SKIN: Denies rash or lesions.  MUSCULOSKELETAL: Denies pain in neck, back, shoulder, knees, hips, or further arthritic symptoms.  NEUROLOGIC: Denies paralysis, paresthesias.  PSYCHIATRIC: Denies anxiety or depressive symptoms. Otherwise full review of systems performed by me is negative.   MEDICATIONS AT HOME:   Prior to Admission medications   Medication Sig Start Date End Date Taking? Authorizing  Provider  albuterol (PROVENTIL HFA;VENTOLIN HFA) 108 (90 Base) MCG/ACT inhaler Inhale into the lungs. 07/06/15   Historical Provider, MD  amiodarone (PACERONE) 200 MG tablet Take 200 mg by mouth. 08/13/15   Historical Provider, MD  amitriptyline (ELAVIL) 75 MG tablet  11/24/15   Historical Provider, MD  buPROPion (WELLBUTRIN XL) 150 MG 24 hr tablet Take by mouth. 07/19/15   Historical Provider, MD  Diphenhyd-Hydrocort-Nystatin (FIRST-DUKES MOUTHWASH) SUSP Use  as directed 5 mLs in the mouth or throat 4 (four) times daily. 12/26/15   Lloyd Huger, MD  docusate sodium (COLACE) 100 MG capsule Take 1 capsule (100 mg total) by mouth 2 (two) times daily. 11/03/15   Hollice Espy, MD  fludrocortisone (FLORINEF) 0.1 MG tablet Take 1 tablet (0.1 mg total) by mouth daily. 11/16/15   Minna Merritts, MD  HYDROcodone-acetaminophen [facility]) 10-325 MG tablet  12/02/15   Historical Provider, MD  lidocaine-prilocaine (EMLA) cream Apply to affected area once 11/17/15   Lloyd Huger, MD  megestrol (MEGACE) 400 MG/10ML suspension Take 5 mLs (200 mg total) by mouth daily. 01/03/16   Lequita Asal, MD  metoprolol succinate (TOPROL-XL) 25 MG 24 hr tablet Take 25 mg by mouth. 08/15/15   Historical Provider, MD  mirtazapine (REMERON) 15 MG tablet TAKE 1 TABLET BY MOUTH AT BEDTIME 11/24/15   Historical Provider, MD  morphine (MS CONTIN) 15 MG 12 hr tablet Take by mouth.    Historical Provider, MD  Naloxone HCl (NARCAN NA) Place into the nose. 12/02/15   Historical Provider, MD  nicotine (NICODERM CQ - DOSED IN MG/24 HOURS) 21 mg/24hr patch Place 21 mg onto the skin daily.    Historical Provider, MD  ondansetron (ZOFRAN) 4 MG tablet  11/08/15   Historical Provider, MD  tiotropium (SPIRIVA) 18 MCG inhalation capsule Place into inhaler and inhale. 11/05/14   Historical Provider, MD      VITAL SIGNS:  Blood pressure (!) 101/57, pulse 79, temperature 98.9 F (37.2 C), temperature source Oral, resp. rate (!) 22, height '5\' 6"'$  (1.676 m), weight 106 lb 12.8 oz (48.4 kg), SpO2 91 %.  PHYSICAL EXAMINATION:  VITAL SIGNS: Vitals:   01/08/16 0433 01/08/16 0852  BP: (!) 101/54 (!) 101/57  Pulse: 100 79  Resp: (!) 22   Temp: 98.9 F (37.2 C)    GENERAL:60 y.o.female currently in milddistress.Frail appearing  HEAD: Normocephalic, atraumatic.  EYES: Pupils equal, round, reactive to light. Extraocular muscles intact. No scleral icterus.  MOUTH: Moist mucosal membrane. Dentition  intact. No abscess noted.  EAR, NOSE, THROAT: Clear without exudates. No external lesions.  NECK: Supple. No thyromegaly. No nodules. No JVD.  PULMONARY: Diffuse coarse rhonchi right sided +wheezes CARDIOVASCULAR: S1 and S2. Regular rate and rhythm. No murmurs, rubs, or gallops. No edema. Pedal pulses 2+ bilaterally.  GASTROINTESTINAL: Soft, nontender, nondistended. No masses. Positive bowel sounds. No hepatosplenomegaly.  MUSCULOSKELETAL: No swelling, clubbing, or edema. Range of motion full in all extremities.  NEUROLOGIC: Cranial nerves II through XII are intact. No gross focal neurological deficits. Sensation intact. Reflexes intact.  SKIN: No ulceration, lesions, rashes, or cyanosis. Skin warm and dry. Turgor intact.  PSYCHIATRIC: Mood, affect within normal limits. The patient is awake, alert and oriented x 3. Insight, judgment intact.    LABORATORY PANEL:   CBC  Recent Labs Lab 01/08/16 0356  WBC 16.4*  HGB 8.1*  HCT 23.0*  PLT 447*   ------------------------------------------------------------------------------------------------------------------  Chemistries   Recent Labs Lab 01/07/16 1743 01/08/16 0356  NA  139 139  K 4.0 3.4*  CL 104 110  CO2 24 23  GLUCOSE 105* 109*  BUN 17 11  CREATININE 0.59 0.45  CALCIUM 9.1 7.7*  MG 1.9  --   AST 24  --   ALT 27  --   ALKPHOS 103  --   BILITOT 0.6  --    ------------------------------------------------------------------------------------------------------------------  Cardiac Enzymes  Recent Labs Lab 01/07/16 1743  TROPONINI <0.03   ------------------------------------------------------------------------------------------------------------------  RADIOLOGY:  Dg Chest 2 View  Result Date: 01/07/2016 CLINICAL DATA:  Acute shortness of breath with cough. History of lung carcinoma. EXAM: CHEST  2 VIEW COMPARISON:  10/24/2015 FINDINGS: There is no longer complete opacification of left hemi thorax. There is left  perihilar and lower lung zone coarse reticular opacity consistent with treatment related scarring. Superimposed infection is possible. Right lung is hyperexpanded. There are irregularly thickened interstitial markings in a most notable the right lung base, similar to the prior study. There is some hazy airspace opacity in the right medial lung base projecting in the right middle lobe on the lateral view. Minimal left pleural effusion.  No right pleural effusion. No pneumothorax. Cardiac silhouette is normal in size.  No mediastinal masses. IMPRESSION: 1. Hazy opacity the right medial lung base suggests superimposed infection on chronic interstitial thickening. 2. Opacity in the left perihilar and lower lung zones likely treatment related scarring. Superimposed pneumonia is possible. 3. Minimal left pleural effusion. 4. No pulmonary edema. Electronically Signed   By: Lajean Manes M.D.   On: 01/07/2016 18:08   Ct Angio Chest Pe W And/or Wo Contrast  Result Date: 01/07/2016 CLINICAL DATA:  Worsening cough and shortness of breath acutely worsened last night, several episodes of blood tinged sputum, productive cough, tachypnea, hypoxia, question pulmonary embolism EXAM: CT ANGIOGRAPHY CHEST WITH CONTRAST TECHNIQUE: Multidetector CT imaging of the chest was performed using the standard protocol during bolus administration of intravenous contrast. Multiplanar CT image reconstructions and MIPs were obtained to evaluate the vascular anatomy. CONTRAST:  50 cc Isovue 370 IV COMPARISON:  PET-CT 11/07/2015, CT chest 10/22/2015 FINDINGS: Cardiovascular: Atherosclerotic calcifications aorta, proximal great vessels, and coronary arteries. Aorta upper normal caliber without aneurysm or dissection. No pericardial effusion. Pulmonary arteries well opacified and patent. No evidence of pulmonary embolism. Mediastinum/Nodes: Again identified tumor at the posterior LEFT hilum, poorly defined, appears increased since the previous  study, estimated 4.1 x 3.3 cm image 52. This extends adjacent to the LEFT mainstem bronchus, esophagus, and aorta as well as questionably the LEFT main pulmonary artery. 9 mm RIGHT paratracheal node image 39. 10 mm LEFT paratracheal node image 40. Suspected LEFT hilar adenopathy confluent with the LEFT perihilar tumor. Lungs/Pleura: Patchy infiltrates in both lungs compatible with infection/pneumonia, predominately central and peribronchial and prevascular in position. Subsegmental atelectasis and volume loss in LEFT lower lobe. Small LEFT pleural effusion. No RIGHT pleural effusion. No pneumothorax. Scattered central peribronchial thickening. Upper Abdomen: 10 mm low-attenuation lesion anteriorly in lateral segment LEFT lobe liver question metastasis unchanged. No additional upper abdominal abnormalities. Musculoskeletal: Superior endplate compression fracture of T11 unchanged. No additional focal osseous lesions identified. Review of the MIP images confirms the above findings. IMPRESSION: No evidence of pulmonary embolism. LEFT perihilar tumor extending into LEFT hilum, appearing slightly larger than on the previous study. New BILATERAL pulmonary infiltrates compatible with infection/pneumonia. Volume loss and subsegmental atelectasis in LEFT lower lobe with small LEFT pleural effusion. Question confluent adenopathy at the LEFT pulmonary hilum with additional small normal to upper normal  sized mediastinal nodes as above. Electronically Signed   By: Lavonia Dana M.D.   On: 01/07/2016 20:34   Ct chest reviewed 01/08/2016 agree with multiple opacities on both lungs    IMPRESSION AND PLAN:   60 year old Caucasian female history of Stage 2A lung cancer admitted for pneumonia with sepsis and COPD exacerbation  1.oxygen as needed 2.continue Iv abx as prescribed 3.aggressive bd therapy 4.continue iv steroids as prescribed 5. History of lung cancer: Continue pain medication, consult oncology for routine follow  through 6.no indication for bronch at this time  I have personally obtained a history, examined the patient, evaluated Pertinent laboratory and RadioGraphic/imaging results, and  formulated the assessment and plan The Patient requires high complexity decision making for assessment and support, frequent evaluation and titration of therapies. Patient/Family are satisfied with Plan of action and management. All questions answered  Corrin Parker, M.D.  [facility]  Medical Director [place] Director [facility] Cardio-Pulmonary Department

## 2016-01-08 NOTE — Progress Notes (Addendum)
Pt's blood pressure is lower than her average at 93/54. Dr. Ara Kussmaul notified, hold scheduled MS Contin until it increases and do not give IV Ativan the patient requests at this time.Give a 250 ml NS bolus.

## 2016-01-08 NOTE — Progress Notes (Signed)
0900 pt noted with coughing after water but does better with milk, questionable aspiration, pt instructed not to drink water at this time, voiced understanding, Dr. Benjie Karvonen notified.

## 2016-01-08 NOTE — Progress Notes (Addendum)
[place] [facility] [place] NAME: Evelyn Willis    MR#:  [id number]  DATE OF BIRTH:  December 20, 1955  SUBJECTIVE:    Patient here shortness of breath. Patient's complaining of nausea. Nausea reports that patient may be aspirating on thin liquids.   REVIEW OF SYSTEMS:    Review of Systems  Constitutional: Positive for malaise/fatigue and weight loss. Negative for chills and fever.  HENT: Negative.  Negative for ear discharge, ear pain, hearing loss, nosebleeds and sore throat.   Eyes: Negative.  Negative for blurred vision and pain.  Respiratory: Positive for cough and shortness of breath. Negative for hemoptysis and wheezing.   Cardiovascular: Negative.  Negative for chest pain, palpitations and leg swelling.  Gastrointestinal: Positive for nausea. Negative for abdominal pain, blood in stool, diarrhea and vomiting.  Genitourinary: Negative.  Negative for dysuria.  Musculoskeletal: Negative.  Negative for back pain.  Skin: Negative.   Neurological: Positive for weakness. Negative for dizziness, tremors, speech change, focal weakness, seizures and headaches.  Endo/Heme/Allergies: Negative.  Does not bruise/bleed easily.  Psychiatric/Behavioral: Negative.  Negative for depression, hallucinations and suicidal ideas.    Tolerating Diet: no      DRUG ALLERGIES:   Allergies  Allergen Reactions  . Aspirin Other (See Comments)    Pt gets bleeding ulcers when she takes this medication Bleeding ulcers  . Nsaids Other (See Comments)    Other reaction(s): Other (See Comments) Cannot take due to significant peptic ulcer disease. Cannot take due to significant peptic ulcer disease.    VITALS:  Blood pressure (!) 101/57, pulse 79, temperature 98.9 F (37.2 C), temperature source Oral, resp. rate (!) 22, height '5\' 6"'$  (1.676 m), weight 48.4 kg (106 lb 12.8 oz), SpO2 91 %.  PHYSICAL EXAMINATION:   Physical Exam  Constitutional: She is oriented to  person, place, and time. She appears distressed.  frail  HENT:  Head: Normocephalic.  Eyes: No scleral icterus.  Neck: Normal range of motion. No JVD present. No tracheal deviation present.  Cardiovascular: Regular rhythm.  Exam reveals no gallop and no friction rub.   No murmur heard. tachycardia  Pulmonary/Chest: Effort normal. No respiratory distress. She has no wheezes. She has no rales. She exhibits no tenderness.  Increased work of  Breathing Decreased with crackles at bases  Abdominal: Soft. Bowel sounds are normal. She exhibits no distension and no mass. There is no tenderness. There is no rebound and no guarding.  Musculoskeletal: Normal range of motion. She exhibits no edema.  Lymphadenopathy:    She has no cervical adenopathy.  Neurological: She is alert and oriented to person, place, and time.  Skin: Skin is warm. No rash noted. No erythema.  Psychiatric: Affect and judgment normal.      LABORATORY PANEL:   CBC  Recent Labs Lab 01/08/16 0356  WBC 16.4*  HGB 8.1*  HCT 23.0*  PLT 447*   ------------------------------------------------------------------------------------------------------------------  Chemistries   Recent Labs Lab 01/07/16 1743 01/08/16 0356  NA 139 139  K 4.0 3.4*  CL 104 110  CO2 24 23  GLUCOSE 105* 109*  BUN 17 11  CREATININE 0.59 0.45  CALCIUM 9.1 7.7*  MG 1.9  --   AST 24  --   ALT 27  --   ALKPHOS 103  --   BILITOT 0.6  --    ------------------------------------------------------------------------------------------------------------------  Cardiac Enzymes  Recent Labs Lab 01/07/16 1743  TROPONINI <0.03   ------------------------------------------------------------------------------------------------------------------  RADIOLOGY:  Dg Chest 2  View  Result Date: 01/07/2016 CLINICAL DATA:  Acute shortness of breath with cough. History of lung carcinoma. EXAM: CHEST  2 VIEW COMPARISON:  10/24/2015 FINDINGS: There is no  longer complete opacification of left hemi thorax. There is left perihilar and lower lung zone coarse reticular opacity consistent with treatment related scarring. Superimposed infection is possible. Right lung is hyperexpanded. There are irregularly thickened interstitial markings in a most notable the right lung base, similar to the prior study. There is some hazy airspace opacity in the right medial lung base projecting in the right middle lobe on the lateral view. Minimal left pleural effusion.  No right pleural effusion. No pneumothorax. Cardiac silhouette is normal in size.  No mediastinal masses. IMPRESSION: 1. Hazy opacity the right medial lung base suggests superimposed infection on chronic interstitial thickening. 2. Opacity in the left perihilar and lower lung zones likely treatment related scarring. Superimposed pneumonia is possible. 3. Minimal left pleural effusion. 4. No pulmonary edema. Electronically Signed   By: Lajean Manes M.D.   On: 01/07/2016 18:08   Ct Angio Chest Pe W And/or Wo Contrast  Result Date: 01/07/2016 CLINICAL DATA:  Worsening cough and shortness of breath acutely worsened last night, several episodes of blood tinged sputum, productive cough, tachypnea, hypoxia, question pulmonary embolism EXAM: CT ANGIOGRAPHY CHEST WITH CONTRAST TECHNIQUE: Multidetector CT imaging of the chest was performed using the standard protocol during bolus administration of intravenous contrast. Multiplanar CT image reconstructions and MIPs were obtained to evaluate the vascular anatomy. CONTRAST:  50 cc Isovue 370 IV COMPARISON:  PET-CT 11/07/2015, CT chest 10/22/2015 FINDINGS: Cardiovascular: Atherosclerotic calcifications aorta, proximal great vessels, and coronary arteries. Aorta upper normal caliber without aneurysm or dissection. No pericardial effusion. Pulmonary arteries well opacified and patent. No evidence of pulmonary embolism. Mediastinum/Nodes: Again identified tumor at the posterior LEFT  hilum, poorly defined, appears increased since the previous study, estimated 4.1 x 3.3 cm image 52. This extends adjacent to the LEFT mainstem bronchus, esophagus, and aorta as well as questionably the LEFT main pulmonary artery. 9 mm RIGHT paratracheal node image 39. 10 mm LEFT paratracheal node image 40. Suspected LEFT hilar adenopathy confluent with the LEFT perihilar tumor. Lungs/Pleura: Patchy infiltrates in both lungs compatible with infection/pneumonia, predominately central and peribronchial and prevascular in position. Subsegmental atelectasis and volume loss in LEFT lower lobe. Small LEFT pleural effusion. No RIGHT pleural effusion. No pneumothorax. Scattered central peribronchial thickening. Upper Abdomen: 10 mm low-attenuation lesion anteriorly in lateral segment LEFT lobe liver question metastasis unchanged. No additional upper abdominal abnormalities. Musculoskeletal: Superior endplate compression fracture of T11 unchanged. No additional focal osseous lesions identified. Review of the MIP images confirms the above findings. IMPRESSION: No evidence of pulmonary embolism. LEFT perihilar tumor extending into LEFT hilum, appearing slightly larger than on the previous study. New BILATERAL pulmonary infiltrates compatible with infection/pneumonia. Volume loss and subsegmental atelectasis in LEFT lower lobe with small LEFT pleural effusion. Question confluent adenopathy at the LEFT pulmonary hilum with additional small normal to upper normal sized mediastinal nodes as above. Electronically Signed   By: Lavonia Dana M.D.   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  1. Sepsis:  Patient presents with  tachycardia, tachypnea and leukocytosis. Sepsis is due to HCAP.(recent chemo) Follow up on blood cultures. Continue ZOSYN and vancomycin. Leukocytosis is improving.  2.  Acute on chronic resp failure with HCAP with recent chemo resp distress this am: continue treatment with ZOSYN (which will cover anaerobes) and VANCOMYCIN MRSA PCR ordered. Check ABG Will also start IV steroids.   3. Squamous cell carcinoma of left lung: Management as per oncology. Continue Megace 4.severe high-grade narrowing at the midthoracic esophagus: This is likely due to external compression from left lung mass. Try dysphagia 3 diet. Aspiration precautions. Speech consult for a.m.  5. History of MS: Continue pain management. 6. History of atrial fibrillation: Continue amiodarone and metoprolol for heart rate control. 7. Hypokalemia: Replete and recheck in a.m.   8. Anemia: Continue to monitor hemoglobin. Patient does have chronic anemia.   Management plans discussed with the patient and family and they are in agreement.  CODE STATUS: DNR D/w patient's sister  Critical care TOTAL TIME TAKING CARE OF THIS PATIENT: 35 minutes.  High risk of intubation  Patient will overall poor prognosis in the setting of above issues. May need palliative care consult on Tuesday if she does not improve  POSSIBLE D/C 3-4 days, DEPENDING ON CLINICAL CONDITION.   Shaunice Levitan M.D on 01/08/2016 at 10:26 AM  Between 7am to 6pm - Pager - [phone] After 6pm go to [email or web address] - password [email or web address] [facility]  Office  [phone]  CC: Primary care physician; BABAOFF, Caryl Bis, MD  Note: This dictation was prepared with Dragon dictation along with smaller phrase technology. Any transcriptional errors that result from this process are unintentional.

## 2016-01-08 NOTE — Progress Notes (Signed)
Patient actually wants to be be FULL CODE

## 2016-01-08 NOTE — Consult Note (Signed)
[facility]  Date of admission:  01/07/2016  Inpatient day:  01/08/16  Consulting physician: Dr. Valentino Nose   Reason for Consultation:  Lung cancer follows with Dr. Grayland Ormond  Chief Complaint: Evelyn Willis is a 60 y.o. 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  HPI:  The patient notes a several day history of of worsening cough and increased shortness of breath.  She states that she is worn out.  Coughing causes nausea and vomiting.  She denies aspiration.  She has had a fever of 100.x and intermittent chills.  Sputum is thick with some blood.  She has been unable to eat x 2 days.    Patient presented to the emergency room.  Initial O2 sats were in the mid 70s which improved with oxygen.  CXR revealed hazy opacity in the right medial lung base suggests superimposed infection on chronic interstitial thickening.  There was opacity in the left perihilar and lower lung zones likely treatment related scarring (superimposed pneumonia possible).  Chest CT angiogram revealed no evidence of pulmonary embolism.  The LEFT perihilar tumor extended into LEFT hilum and appeared slightly larger than on the previous study.  There were new BILATERAL pulmonary infiltrates compatible with infection/pneumonia.  There was volume loss and subsegmental atelectasis in LEFT lower lobe with small LEFT pleural effusion.  There was question confluent adenopathy at the LEFT pulmonary hilum with additional small normal to upper normal sized mediastinal nodes   She met sepsis criteria by leukocytosis, hear rate, and respiratory rate.  Lactic acid was 2.1.  She was pan cultured and begun on broad spectrum antibiotics (Cefepime and vancomycin).   Symptomatically, she feels about the same today.  Fever has improved.   Past Medical History:  Diagnosis Date  . Atrial fibrillation ([place])   . Cancer [facility]) 2016   Left lung  cancer, radiation, chemo  . COPD (chronic obstructive pulmonary disease) ([place])   . Coronary artery disease    said- stent was placed 10 yrs ago from now( 2017) in [place]  . Depression   . Emphysema lung ([place])   . Hematuria   . Multiple sclerosis ([place])   . Renal calculi     Past Surgical History:  Procedure Laterality Date  . EXTRACORPOREAL SHOCK WAVE LITHOTRIPSY Right 11/03/2015   Procedure: EXTRACORPOREAL SHOCK WAVE LITHOTRIPSY (ESWL);  Surgeon: Hollice Espy, MD;  Location: [facility] ORS;  Service: Urology;  Laterality: Right;  . stomach ulcer removal  2011  . TOTAL HIP ARTHROPLASTY  2011    Family History  Problem Relation Age of Onset  . CAD Mother   . Atrial fibrillation Mother   . Heart attack Neg Hx   . Hypertension Neg Hx   . Stroke Neg Hx   . Kidney disease Neg Hx     Social History:  reports that she quit smoking about 5 months ago. She has a 67.50 pack-year smoking history. She has quit using smokeless tobacco. She reports that she drinks alcohol. She reports that she does not use drugs.  She is accompanied by her sister and father today.  Allergies:  Allergies  Allergen Reactions  . Aspirin Other (See Comments)    Pt gets bleeding ulcers when she takes this medication Bleeding ulcers  . Nsaids Other (See Comments)    Other reaction(s): Other (See Comments) Cannot take due to significant peptic ulcer disease. Cannot take due to significant peptic ulcer disease.  Medications Prior to Admission  Medication Sig Dispense Refill  . albuterol (PROVENTIL HFA;VENTOLIN HFA) 108 (90 Base) MCG/ACT inhaler Inhale 2 puffs into the lungs every 6 (six) hours as needed for wheezing or shortness of breath.     Marland Kitchen amiodarone (PACERONE) 200 MG tablet Take 200 mg by mouth 2 (two) times daily.     Marland Kitchen amitriptyline (ELAVIL) 75 MG tablet Take 75 mg by mouth at bedtime.     Marland Kitchen amphetamine-dextroamphetamine (ADDERALL) 20 MG tablet Take 1 tablet by mouth every morning.    Marland Kitchen  buPROPion (WELLBUTRIN XL) 150 MG 24 hr tablet Take 150 mg by mouth at bedtime.     . Diphenhyd-Hydrocort-Nystatin (FIRST-DUKES MOUTHWASH) SUSP Use as directed 5 mLs in the mouth or throat 4 (four) times daily. 240 mL 2  . HYDROcodone-acetaminophen (NORCO) 10-325 MG tablet Take 2 tablets by mouth 3 (three) times daily as needed for moderate pain or severe pain.     . hydrocortisone (CORTEF) 20 MG tablet Take 40 mg by mouth 3 (three) times daily.    . megestrol (MEGACE) 400 MG/10ML suspension Take 5 mLs (200 mg total) by mouth daily. 150 mL 0  . metoprolol succinate (TOPROL-XL) 25 MG 24 hr tablet Take 25 mg by mouth every morning.     . mirtazapine (REMERON) 15 MG tablet TAKE 1 TABLET BY MOUTH AT BEDTIME    . morphine (MS CONTIN) 15 MG 12 hr tablet Take 15 mg by mouth every 12 (twelve) hours.     . Naloxone HCl (NARCAN NA) Place 1 application into the nose every 10 (ten) minutes as needed. For respiratory depression, unconsciousness, opioid overdose.    . tiotropium (SPIRIVA) 18 MCG inhalation capsule Place 18 mcg into inhaler and inhale every morning.     . docusate sodium (COLACE) 100 MG capsule Take 1 capsule (100 mg total) by mouth 2 (two) times daily. (Patient not taking: Reported on 01/07/2016) 60 capsule 0  . fludrocortisone (FLORINEF) 0.1 MG tablet Take 1 tablet (0.1 mg total) by mouth daily. 30 tablet 6  . lidocaine-prilocaine (EMLA) cream Apply to affected area once (Patient not taking: Reported on 01/07/2016) 30 g 3  . nicotine (NICODERM CQ - DOSED IN MG/24 HOURS) 21 mg/24hr patch Place 21 mg onto the skin daily.      Review of Systems: GENERAL:  Fatigue.  Fever and chills at home. Weight loss. PERFORMANCE STATUS (ECOG):  2 HEENT:  Thrush.  No visual changes, runny nose, sore throat, or tenderness. Lungs: Shortness of breath.  Productive cough with some hemoptysis. Cardiac:  Needs to sit up to breath/  No chest pain, palpitations. GI:  Coughing causes nausea and vomiting.  Difficulty  swallowing.  No diarrhea, constipation, melena or hematochezia. GU:  No urgency, frequency, dysuria, or hematuria. Musculoskeletal:  No back pain.  No muscle tenderness. Extremities:  No pain or swelling. Skin:  No rashes or skin changes. Neuro:  General weakness.  MS.  No headache, numbness or weakness, balance or coordination issues. Endocrine:  No diabetes, thyroid issues, hot flashes or night sweats. Psych:  No mood changes, depression.  Anxiety. Pain:  No focal pain. Review of systems:  All other systems reviewed and found to be negative.  Physical Exam:  Blood pressure (!) 101/57, pulse 79, temperature 98.9 F (37.2 C), temperature source Oral, resp. rate (!) 22, height 5' 6"  (1.676 m), weight 106 lb 12.8 oz (48.4 kg), SpO2 91 %.  GENERAL:  Thin chronically ill appearing woman sitting up  in bed in no acute distress. MENTAL STATUS:  Alert and oriented to person, place and time. HEAD:  Slightly disheveled brown hair with slight graying.  Normocephalic, atraumatic, face symmetric, no Cushingoid features. EYES:  Brown eyes.  Pupils equal round and reactive to light and accomodation.  No conjunctivitis or scleral icterus. ENT:  Marston in place.  Mild right sided buccal mucosa thrush.  Tongue normal.  Mucous membranes moist.  RESPIRATORY:  Decreased respiratory excursion.  Wet cough.  Diffuse coarse upper airway breath sounds.  Soft wheezes. CARDIOVASCULAR:  Regular rate and rhythm without murmur, rub or gallop. ABDOMEN:  Soft, non-tender, with active bowel sounds, and no hepatosplenomegaly.  No masses. SKIN:  Upper extremity ecchymosis.  No rashes, ulcers or lesions. EXTREMITIES: Thin extremities.  No edema, no skin discoloration or tenderness.  No palpable cords. LYMPH NODES: No palpable cervical, supraclavicular, axillary or inguinal adenopathy  NEUROLOGICAL: Unremarkable. PSYCH:  Appropriate.    Results for orders placed or performed during the hospital encounter of 01/07/16 (from the  past 48 hour(s))  CBC with Differential/Platelet     Status: Abnormal   Collection Time: 01/07/16  5:43 PM  Result Value Ref Range   WBC 21.7 (H) 3.6 - 11.0 K/uL   RBC 3.84 3.80 - 5.20 MIL/uL   Hemoglobin 10.1 (L) 12.0 - 16.0 g/dL   HCT 31.6 (L) 35.0 - 47.0 %   MCV 82.4 80.0 - 100.0 fL   MCH 26.2 26.0 - 34.0 pg   MCHC 31.8 (L) 32.0 - 36.0 g/dL   RDW 18.2 (H) 11.5 - 14.5 %   Platelets 607 (H) 150 - 440 K/uL   Neutrophils Relative % 92 %   Neutro Abs 20.1 (H) 1.4 - 6.5 K/uL   Lymphocytes Relative 3 %   Lymphs Abs 0.7 (L) 1.0 - 3.6 K/uL   Monocytes Relative 4 %   Monocytes Absolute 0.8 0.2 - 0.9 K/uL   Eosinophils Relative 1 %   Eosinophils Absolute 0.2 0 - 0.7 K/uL   Basophils Relative 0 %   Basophils Absolute 0.1 0 - 0.1 K/uL  Comprehensive metabolic panel     Status: Abnormal   Collection Time: 01/07/16  5:43 PM  Result Value Ref Range   Sodium 139 135 - 145 mmol/L   Potassium 4.0 3.5 - 5.1 mmol/L   Chloride 104 101 - 111 mmol/L   CO2 24 22 - 32 mmol/L   Glucose, Bld 105 (H) 65 - 99 mg/dL   BUN 17 6 - 20 mg/dL   Creatinine, Ser 0.59 0.44 - 1.00 mg/dL   Calcium 9.1 8.9 - 10.3 mg/dL   Total Protein 8.1 6.5 - 8.1 g/dL   Albumin 3.1 (L) 3.5 - 5.0 g/dL   AST 24 15 - 41 U/L   ALT 27 14 - 54 U/L   Alkaline Phosphatase 103 38 - 126 U/L   Total Bilirubin 0.6 0.3 - 1.2 mg/dL   GFR calc non Af Amer >60 >60 mL/min   GFR calc Af Amer >60 >60 mL/min    Comment: (NOTE) The eGFR has been calculated using the CKD EPI equation. This calculation has not been validated in all clinical situations. eGFR's persistently <60 mL/min signify possible Chronic Kidney Disease.    Anion gap 11 5 - 15  Troponin I     Status: None   Collection Time: 01/07/16  5:43 PM  Result Value Ref Range   Troponin I <0.03 <0.03 ng/mL  Magnesium     Status: None  Collection Time: 01/07/16  5:43 PM  Result Value Ref Range   Magnesium 1.9 1.7 - 2.4 mg/dL  Lactic acid, plasma     Status: None   Collection  Time: 01/07/16  6:47 PM  Result Value Ref Range   Lactic Acid, Venous 1.3 0.5 - 1.9 mmol/L  Lactic acid, plasma     Status: Abnormal   Collection Time: 01/07/16  9:45 PM  Result Value Ref Range   Lactic Acid, Venous 2.1 (HH) 0.5 - 1.9 mmol/L    Comment: CRITICAL RESULT CALLED TO, READ BACK BY AND VERIFIED WITH ERWIN MACROHON AT 2228 01/07/16.PMH  Urinalysis complete, with microscopic ([facility] only)     Status: Abnormal   Collection Time: 01/08/16 12:48 AM  Result Value Ref Range   Color, Urine YELLOW (A) YELLOW   APPearance CLEAR (A) CLEAR   Glucose, UA NEGATIVE NEGATIVE mg/dL   Bilirubin Urine NEGATIVE NEGATIVE   Ketones, ur NEGATIVE NEGATIVE mg/dL   Specific Gravity, Urine 1.035 (H) 1.005 - 1.030   Hgb urine dipstick 1+ (A) NEGATIVE   pH 5.0 5.0 - 8.0   Protein, ur NEGATIVE NEGATIVE mg/dL   Nitrite NEGATIVE NEGATIVE   Leukocytes, UA TRACE (A) NEGATIVE   RBC / HPF 0-5 0 - 5 RBC/hpf   WBC, UA 0-5 0 - 5 WBC/hpf   Bacteria, UA NONE SEEN NONE SEEN   Squamous Epithelial / LPF 0-5 (A) NONE SEEN  Basic metabolic panel     Status: Abnormal   Collection Time: 01/08/16  3:56 AM  Result Value Ref Range   Sodium 139 135 - 145 mmol/L   Potassium 3.4 (L) 3.5 - 5.1 mmol/L   Chloride 110 101 - 111 mmol/L   CO2 23 22 - 32 mmol/L   Glucose, Bld 109 (H) 65 - 99 mg/dL   BUN 11 6 - 20 mg/dL   Creatinine, Ser 0.45 0.44 - 1.00 mg/dL   Calcium 7.7 (L) 8.9 - 10.3 mg/dL   GFR calc non Af Amer >60 >60 mL/min   GFR calc Af Amer >60 >60 mL/min    Comment: (NOTE) The eGFR has been calculated using the CKD EPI equation. This calculation has not been validated in all clinical situations. eGFR's persistently <60 mL/min signify possible Chronic Kidney Disease.    Anion gap 6 5 - 15  CBC     Status: Abnormal   Collection Time: 01/08/16  3:56 AM  Result Value Ref Range   WBC 16.4 (H) 3.6 - 11.0 K/uL   RBC 2.76 (L) 3.80 - 5.20 MIL/uL   Hemoglobin 8.1 (L) 12.0 - 16.0 g/dL    Comment: Result Verified by  Recollection TCH   HCT 23.0 (L) 35.0 - 47.0 %   MCV 83.3 80.0 - 100.0 fL   MCH 26.1 26.0 - 34.0 pg   MCHC 31.3 (L) 32.0 - 36.0 g/dL   RDW 18.3 (H) 11.5 - 14.5 %   Platelets 447 (H) 150 - 440 K/uL  Lactic acid, plasma     Status: None   Collection Time: 01/08/16  3:56 AM  Result Value Ref Range   Lactic Acid, Venous 1.1 0.5 - 1.9 mmol/L   Dg Chest 2 View  Result Date: 01/07/2016 CLINICAL DATA:  Acute shortness of breath with cough. History of lung carcinoma. EXAM: CHEST  2 VIEW COMPARISON:  10/24/2015 FINDINGS: There is no longer complete opacification of left hemi thorax. There is left perihilar and lower lung zone coarse reticular opacity consistent with treatment related scarring.  Superimposed infection is possible. Right lung is hyperexpanded. There are irregularly thickened interstitial markings in a most notable the right lung base, similar to the prior study. There is some hazy airspace opacity in the right medial lung base projecting in the right middle lobe on the lateral view. Minimal left pleural effusion.  No right pleural effusion. No pneumothorax. Cardiac silhouette is normal in size.  No mediastinal masses. IMPRESSION: 1. Hazy opacity the right medial lung base suggests superimposed infection on chronic interstitial thickening. 2. Opacity in the left perihilar and lower lung zones likely treatment related scarring. Superimposed pneumonia is possible. 3. Minimal left pleural effusion. 4. No pulmonary edema. Electronically Signed   By: Lajean Manes M.D.   On: 01/07/2016 18:08   Ct Angio Chest Pe W And/or Wo Contrast  Result Date: 01/07/2016 CLINICAL DATA:  Worsening cough and shortness of breath acutely worsened last night, several episodes of blood tinged sputum, productive cough, tachypnea, hypoxia, question pulmonary embolism EXAM: CT ANGIOGRAPHY CHEST WITH CONTRAST TECHNIQUE: Multidetector CT imaging of the chest was performed using the standard protocol during bolus administration  of intravenous contrast. Multiplanar CT image reconstructions and MIPs were obtained to evaluate the vascular anatomy. CONTRAST:  50 cc Isovue 370 IV COMPARISON:  PET-CT 11/07/2015, CT chest 10/22/2015 FINDINGS: Cardiovascular: Atherosclerotic calcifications aorta, proximal great vessels, and coronary arteries. Aorta upper normal caliber without aneurysm or dissection. No pericardial effusion. Pulmonary arteries well opacified and patent. No evidence of pulmonary embolism. Mediastinum/Nodes: Again identified tumor at the posterior LEFT hilum, poorly defined, appears increased since the previous study, estimated 4.1 x 3.3 cm image 52. This extends adjacent to the LEFT mainstem bronchus, esophagus, and aorta as well as questionably the LEFT main pulmonary artery. 9 mm RIGHT paratracheal node image 39. 10 mm LEFT paratracheal node image 40. Suspected LEFT hilar adenopathy confluent with the LEFT perihilar tumor. Lungs/Pleura: Patchy infiltrates in both lungs compatible with infection/pneumonia, predominately central and peribronchial and prevascular in position. Subsegmental atelectasis and volume loss in LEFT lower lobe. Small LEFT pleural effusion. No RIGHT pleural effusion. No pneumothorax. Scattered central peribronchial thickening. Upper Abdomen: 10 mm low-attenuation lesion anteriorly in lateral segment LEFT lobe liver question metastasis unchanged. No additional upper abdominal abnormalities. Musculoskeletal: Superior endplate compression fracture of T11 unchanged. No additional focal osseous lesions identified. Review of the MIP images confirms the above findings. IMPRESSION: No evidence of pulmonary embolism. LEFT perihilar tumor extending into LEFT hilum, appearing slightly larger than on the previous study. New BILATERAL pulmonary infiltrates compatible with infection/pneumonia. Volume loss and subsegmental atelectasis in LEFT lower lobe with small LEFT pleural effusion. Question confluent adenopathy at the  LEFT pulmonary hilum with additional small normal to upper normal sized mediastinal nodes as above. Electronically Signed   By: Lavonia Dana M.D.   On: 01/07/2016 20:34    Assessment:  The patient is a 60 y.o. 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  She is admitted with bilateral pneumonia.  Concern for possible aspiration given external compression from the left sided lung mass and coughing episodes leading to vomiting.  Plan:   1.  Oncology:  Patient s/p concurrent concurrent chemotherapy and radiation in 2016 for stage IIA lung cancer.  She is currently day 6 s/p cycle #4 Opdivo.  2.  Hematology:  Elevated WBC secondary to infection.  Reactive thrombocytosis.  Anemia likely due to chronic disease (normal B12, folate, ferritin on 01/03/2016).  Lovenox for DVT prophylaxis.  3.  Pulmonary:  Bilateral pneumonia.  Concern for possible aspiration given external compression from the left sided lung mass and coughing episodes leading to vomiting.  Patient on broad spectrum antibiotics, Duoneb, Spiriva.  Aspiration precautions.  Swallowing evaluation.  4.  Infectious disease:  Patient pan cultured.  On Zosyn and vancomycin.  Following vancomycin levels.  Magic mouthwash for thrush.   5.  Code status:  Discussed with patient.  Patient desires full code.  Thank you for allowing me to participate in KELLEN HOVER 's care.  I will follow her closely with you until Dr. Gary Fleet return on 01/09/2016.   Lequita Asal, MD  01/08/2016, 11:16 AM

## 2016-01-09 DIAGNOSIS — R319 Hematuria, unspecified: Secondary | ICD-10-CM

## 2016-01-09 DIAGNOSIS — C3492 Malignant neoplasm of unspecified part of left bronchus or lung: Secondary | ICD-10-CM

## 2016-01-09 DIAGNOSIS — R531 Weakness: Secondary | ICD-10-CM

## 2016-01-09 DIAGNOSIS — Z515 Encounter for palliative care: Secondary | ICD-10-CM

## 2016-01-09 DIAGNOSIS — Z7189 Other specified counseling: Secondary | ICD-10-CM

## 2016-01-09 DIAGNOSIS — R634 Abnormal weight loss: Secondary | ICD-10-CM

## 2016-01-09 DIAGNOSIS — J9621 Acute and chronic respiratory failure with hypoxia: Secondary | ICD-10-CM

## 2016-01-09 DIAGNOSIS — J439 Emphysema, unspecified: Secondary | ICD-10-CM

## 2016-01-09 LAB — BASIC METABOLIC PANEL
ANION GAP: 6 (ref 5–15)
BUN: 14 mg/dL (ref 6–20)
CALCIUM: 8.7 mg/dL — AB (ref 8.9–10.3)
CO2: 24 mmol/L (ref 22–32)
CREATININE: 0.48 mg/dL (ref 0.44–1.00)
Chloride: 110 mmol/L (ref 101–111)
GLUCOSE: 128 mg/dL — AB (ref 65–99)
Potassium: 4.1 mmol/L (ref 3.5–5.1)
Sodium: 140 mmol/L (ref 135–145)

## 2016-01-09 LAB — CBC
HCT: 27.5 % — ABNORMAL LOW (ref 35.0–47.0)
Hemoglobin: 8.7 g/dL — ABNORMAL LOW (ref 12.0–16.0)
MCH: 26.2 pg (ref 26.0–34.0)
MCHC: 31.6 g/dL — AB (ref 32.0–36.0)
MCV: 82.8 fL (ref 80.0–100.0)
PLATELETS: 546 10*3/uL — AB (ref 150–440)
RBC: 3.33 MIL/uL — ABNORMAL LOW (ref 3.80–5.20)
RDW: 18.1 % — AB (ref 11.5–14.5)
WBC: 22.2 10*3/uL — AB (ref 3.6–11.0)

## 2016-01-09 MED ORDER — LORAZEPAM 0.5 MG PO TABS
0.2500 mg | ORAL_TABLET | Freq: Once | ORAL | Status: AC
  Administered 2016-01-09: 0.25 mg via ORAL
  Filled 2016-01-09: qty 1

## 2016-01-09 MED ORDER — MORPHINE SULFATE (PF) 2 MG/ML IV SOLN
2.0000 mg | INTRAVENOUS | Status: DC | PRN
  Administered 2016-01-09 – 2016-01-10 (×6): 2 mg via INTRAVENOUS
  Filled 2016-01-09 (×6): qty 1

## 2016-01-09 MED ORDER — BISACODYL 10 MG RE SUPP: 10.0000 mg | Freq: Every day | RECTAL | Status: DC | PRN

## 2016-01-09 MED ORDER — PIPERACILLIN-TAZOBACTAM 3.375 G IVPB
3.3750 g | Freq: Three times a day (TID) | INTRAVENOUS | Status: DC
  Administered 2016-01-09 – 2016-01-11 (×6): 3.375 g via INTRAVENOUS
  Filled 2016-01-09 (×8): qty 50

## 2016-01-09 MED ORDER — BENZONATATE 100 MG PO CAPS
100.0000 mg | ORAL_CAPSULE | Freq: Three times a day (TID) | ORAL | Status: DC | PRN
  Administered 2016-01-09: 100 mg via ORAL
  Filled 2016-01-09: qty 1

## 2016-01-09 MED ORDER — SODIUM CHLORIDE 0.9 % IV SOLN
INTRAVENOUS | Status: AC
  Administered 2016-01-09 – 2016-01-10 (×2): via INTRAVENOUS

## 2016-01-09 NOTE — Progress Notes (Signed)
Notified MD first bolus slightly raised blood pressure. After second bolus, the BP slightly increased again. A one time order of 0.25 mg PO Ativan ordered and Tessalon 100 mg TID PRN ordered as well. Continue to assess.

## 2016-01-09 NOTE — Progress Notes (Signed)
[place] [facility] Consultation   PATIENT NAME: Evelyn Willis    MR#:  [id number]  DATE OF BIRTH:  1955/09/02   DATE OF ADMISSION:  01/07/2016   REQUESTING/REFERRING PHYSICIAN: Mody CHIEF COMPLAINT:   Chief Complaint  Patient presents with  . Shortness of Breath  Increased WOB and cough  HISTORY OF PRESENT ILLNESS:  Aurelie Dicenzo  is a 60 y.o. female with a known history of Lung cancer Stage 2A,  - on active chemo treatment presenting with shortness of breath and cough.  -She states 2 day duration of continuing cough with associated posttussive emesis.  -Cough productive green sputum denies fevers chills positive fatigue and weakness. -code sepsis was started in ER -PFT from 11/04/15, FEV1 51%, air trapping, DLCO equals 51%, consistent with moderate COPD, on the cusp of becoming severe.  PAST MEDICAL HISTORY:   Past Medical History:  Diagnosis Date  . Atrial fibrillation ([place])   . Cancer [facility]) 2016   Left lung cancer, radiation, chemo  . COPD (chronic obstructive pulmonary disease) ([place])   . Coronary artery disease    said- stent was placed 10 yrs ago from now( 2017) in [place], [place]  . Depression   . Emphysema lung ([place])   . Hematuria   . Multiple sclerosis ([place])   . Renal calculi    SUBJECTIVE: Pt currently on 4L O2 via nasal canula no acute distress.  She states she still has a frequent productive cough producing green tinged sputum. She is having frequent anxiety denies current shortness of breath.     REVIEW OF SYSTEMS:  REVIEW OF SYSTEMS: Positives in BOLD CONSTITUTIONAL: Denies fevers, chills, Positive fatigue, weakness. Positive weight loss EYES: Denies blurred vision, double vision, or eye pain.  EARS, NOSE, THROAT: Denies tinnitus, ear pain, hearing loss.  RESPIRATORY: Positive cough, +shortness of breath, +wheezing  CARDIOVASCULAR: Denies chest pain, palpitations, edema.  GASTROINTESTINAL: Denies nausea, vomiting, diarrhea, abdominal pain.  GENITOURINARY:  Denies dysuria, hematuria.  ENDOCRINE: Denies nocturia or thyroid problems. HEMATOLOGIC AND LYMPHATIC: Denies easy bruising or bleeding.  SKIN: Denies rash or lesions.  MUSCULOSKELETAL: Denies pain in neck, back, shoulder, knees, hips, or further arthritic symptoms.  NEUROLOGIC: Denies paralysis, paresthesias.  PSYCHIATRIC: Denies anxiety or depressive symptoms.  VITAL SIGNS:  Blood pressure 123/63, pulse 94, temperature 97.7 F (36.5 C), temperature source Oral, resp. rate 20, height '5\' 6"'$  (1.676 m), weight 106 lb 12.8 oz (48.4 kg), SpO2 95 %.   GENERAL: chronically ill frail appearing female  NEURO: alert and oriented, follows commands HEAD: Normocephalic, atraumatic.  EAR, NOSE, THROAT: Clear without exudates. No external lesions.  NECK: Supple, No JVD PULMONARY: rhonchi throughout,  even non labored CARDIOVASCULAR: s1s2, rrr, no M/R/G, no edema, pedal pulses 2+ bilaterally.  GASTROINTESTINAL: hypoactive BSx4, Soft, nontender, non distended, no masses MUSCULOSKELETAL: frail appearing, full ROM SKIN: No ulceration, lesions, rashes, or cyanosis. Skin warm and dry. Turgor intact.   IMPRESSION AND PLAN:   60 year old Caucasian female history of Stage 2A lung cancer admitted for pneumonia with sepsis and COPD exacerbation  1. Supplemental O2 as needed to maintain O2 sats 92% to 94% 2. Continue IV abx as prescribed 3. Continue proventil, pulmicort, and spiriva 4. Continue IV solumedrol as prescribed 5. History of Squamous cell carcinoma of left lung Stage 2A: Continue pain     medication, Oncology Consulted appreciate input 6. Agree with Palliative Care Consult to discuss goals of treatment 7. Intermittent CXR 8. No indication for bronch at this time   Marda Stalker, [place]  Pulmonary/Critical Care Pager [phone] (please enter 7 digits) PCCM Consult Pager [phone] (please enter 7 digits)   STAFF NOTE: I, Dr. Vilinda Boehringer have personally reviewed patient's available  data, including medical history, events of note, physical examination and test results as part of my evaluation. I have discussed with NP Demaris Callander  and other care providers such as pharmacist, RN and RRT.    Still with cough, productive at times, mild improvement, has dysphagia  A:60 year old Caucasian female history of Stage 2A lung cancer admitted for pneumonia with sepsis and COPD exacerbation  Pneumonia (post obstructive) Lung Ca - Hilar Mass on chemotherapy, Squamous cell Ca SOB Cough Wheezing Severe Reflux   P:  - cont with PNA treatment - cont with steroids - CT reviewed, hilar mass is larger, could be causing some compression of the airway and esophagus.  At this time would recommend to continue with recs per heme\onc, aspiration precautions, may consider GI consult for evaluation of the esophagus, given finding on barium swallow; however, not sure there is much intervention to be done, given the underlying lung mass is etiology of her discomfort. - consider palliative consult - goals of care.    Marland Kitchen  Rest per NP/medical resident whose note is outlined above and that I agree with  Pulmonary Care Time devoted to patient care services described in this note is  40 Minutes.   This time reflects time of care of this signee Dr Vilinda Boehringer.  This critical care time does not reflect procedure time, or teaching time or supervisory time of PA/NP/Med-student/Med Resident etc but could involve care discussion time.  Vilinda Boehringer, MD [place] [facility]monary and Critical Care Pager [phone] (please enter 7-digits) On Call Pager [phone] (please enter 7-digits)  Note: This note was prepared with Dragon dictation along with smaller phrase technology. Any transcriptional errors that result from this process are unintentional.

## 2016-01-09 NOTE — Plan of Care (Signed)
Problem: Education: Goal: Knowledge of [place] [facility] Education information/materials will improve Outcome: Progressing Discussed plan of care with patient and family in great depth. The plan of care was reitereated with the patient and family.  Problem: Pain Managment: Goal: General experience of comfort will improve Outcome: Progressing Morphine given for pain.  Problem: Nutrition: Goal: Adequate nutrition will be maintained Outcome: Progressing NPO based on Speech evauation related to aspiration precautions.  Problem: Coping: Goal: Ability to identify and develop effective coping behavior will improve Outcome: Progressing Patient and family not coping, especially daughter. Goal: Level of anxiety will decrease Outcome: Progressing Ativan given during shift.  Problem: Respiratory: Goal: Levels of oxygenation will improve Outcome: Progressing Remains on 4L O2. Scheduled breathing treatments.  Problem: Spiritual Needs Goal: Ability to function at adequate level Outcome: Progressing Chaplain requested during shift.

## 2016-01-09 NOTE — Care Management Important Message (Signed)
Important Message  Patient Details  Name: Evelyn Willis MRN: [id number] Date of Birth: Feb 07, 1956   Medicare Important Message Given:  Yes    Shelbie Ammons, RN 01/09/2016, 1:12 PM

## 2016-01-09 NOTE — Progress Notes (Signed)
   01/09/16 1300  Clinical Encounter Type  Visited With Family;Patient not available  Visit Type Spiritual support  Referral From Nurse  Consult/Referral To Chaplain  Spiritual Encounters  Spiritual Needs Emotional  Stress Factors  Patient Stress Factors Exhausted  Family Stress Factors Loss of control;Major life changes;Exhausted  Met w/patient's daughter who advised that Landrie was sleeping for the first time in a while. Daughter expressed her anxiety with watching her mother suffer. She appears very protective of her mother and exhibits some emotional transference and may be projecting "needs" for her own comfort onto her mother (e.g. Hunger). Daughter may need compassionate support and presence during the next few days.  [place]. [place] Ware G. [place]

## 2016-01-09 NOTE — Progress Notes (Signed)
[place]  [phone] Fax:(336) [phone]  ID: LACHELL ROCHETTE OB: 12/04/1955  MR#: [id number]  VEL#:[id number]  Patient Care Team: Derinda Late, MD as PCP - General (Family Medicine) Minna Merritts, MD as Consulting Physician (Cardiology)  CHIEF COMPLAINT: 60 y.o. 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 She continues to be unable to eat. She continues to generally terrible, but offers no new complaints.  REVIEW OF SYSTEMS:   Review of Systems  Constitutional: Positive for malaise/fatigue and weight loss. Negative for fever.  Respiratory: Positive for cough, sputum production and shortness of breath. Negative for hemoptysis.   Cardiovascular: Negative.  Negative for chest pain.  Gastrointestinal: Negative.  Negative for abdominal pain.  Genitourinary: Negative.   Musculoskeletal: Negative.   Neurological: Positive for weakness.  Psychiatric/Behavioral: Negative.     As per HPI. Otherwise, a complete review of systems is negatve.  PAST MEDICAL HISTORY: Past Medical History:  Diagnosis Date  . Atrial fibrillation ([place])   . Cancer [facility]) 2016   Left lung cancer, radiation, chemo  . COPD (chronic obstructive pulmonary disease) ([place])   . Coronary artery disease    said- stent was placed 10 yrs ago from now( 2017) in [place], [place]  . Depression   . Emphysema lung ([place])   . Hematuria   . Multiple sclerosis ([place])   . Renal calculi     PAST SURGICAL HISTORY: Past Surgical History:  Procedure Laterality Date  . EXTRACORPOREAL SHOCK WAVE LITHOTRIPSY Right 11/03/2015   Procedure: EXTRACORPOREAL SHOCK WAVE LITHOTRIPSY (ESWL);  Surgeon: Hollice Espy, MD;  Location: [facility] ORS;  Service: Urology;  Laterality: Right;  . stomach ulcer removal  2011  . TOTAL HIP ARTHROPLASTY  2011    FAMILY HISTORY: Family  History  Problem Relation Age of Onset  . CAD Mother   . Atrial fibrillation Mother   . Heart attack Neg Hx   . Hypertension Neg Hx   . Stroke Neg Hx   . Kidney disease Neg Hx        ADVANCED DIRECTIVES:    HEALTH MAINTENANCE: Social History  Substance Use Topics  . Smoking status: Former Smoker    Packs/day: 1.50    Years: 45.00    Quit date: 07/26/2015  . Smokeless tobacco: Former Systems developer     Comment: x 2 weeks  . Alcohol use 0.0 oz/week     Colonoscopy:  PAP:  Bone density:  Lipid panel:  Allergies  Allergen Reactions  . Aspirin Other (See Comments)    Pt gets bleeding ulcers when she takes this medication Bleeding ulcers  . Nsaids Other (See Comments)    Other reaction(s): Other (See Comments) Cannot take due to significant peptic ulcer disease. Cannot take due to significant peptic ulcer disease.    Current Facility-Administered Medications  Medication Dose Route Frequency Provider Last Rate Last Dose  . 0.9 %  sodium chloride infusion   Intravenous Continuous Hillary Bow, MD 50 mL/hr at 01/09/16 1800    . acetaminophen (TYLENOL) tablet 650 mg  650 mg Oral Q6H PRN Lytle Butte, MD       Or  . acetaminophen (TYLENOL) suppository 650 mg  650 mg Rectal Q6H PRN Lytle Butte, MD      . albuterol (PROVENTIL) (2.5 MG/3ML) 0.083% nebulizer solution 2.5 mg  2.5 mg Nebulization Q2H PRN Harrie Foreman, MD      .  amiodarone (PACERONE) tablet 200 mg  200 mg Oral Daily Lytle Butte, MD   200 mg at 01/08/16 0844  . amitriptyline (ELAVIL) tablet 75 mg  75 mg Oral QHS Lytle Butte, MD   75 mg at 01/08/16 2136  . benzonatate (TESSALON) capsule 100 mg  100 mg Oral TID PRN Alexis Hugelmeyer, DO   100 mg at 01/09/16 0046  . bisacodyl (DULCOLAX) suppository 10 mg  10 mg Rectal Daily PRN Srikar Sudini, MD      . budesonide (PULMICORT) nebulizer solution 0.5 mg  0.5 mg Nebulization BID Flora Lipps, MD   0.5 mg at 01/09/16 1941  . buPROPion (WELLBUTRIN XL) 24 hr tablet 150 mg   150 mg Oral Daily Lytle Butte, MD   150 mg at 01/08/16 0844  . enoxaparin (LOVENOX) injection 40 mg  40 mg Subcutaneous Q24H Lytle Butte, MD   40 mg at 01/09/16 2206  . fludrocortisone (FLORINEF) tablet 0.1 mg  0.1 mg Oral Daily Lytle Butte, MD   0.1 mg at 01/08/16 0844  . guaiFENesin-codeine 100-10 MG/5ML solution 10 mL  10 mL Oral Q4H PRN Lytle Butte, MD   10 mL at 01/08/16 2147  . ipratropium-albuterol (DUONEB) 0.5-2.5 (3) MG/3ML nebulizer solution 3 mL  3 mL Nebulization Q4H Lytle Butte, MD   3 mL at 01/09/16 1941  . LORazepam (ATIVAN) injection 0.5 mg  0.5 mg Intravenous Q4H PRN Harrie Foreman, MD   0.5 mg at 01/09/16 2205  . magic mouthwash  5 mL Oral QID Lenis Noon, RPH   5 mL at 01/09/16 2218  . megestrol (MEGACE) 400 MG/10ML suspension 200 mg  200 mg Oral Daily Lytle Butte, MD   200 mg at 01/08/16 0844  . methylPREDNISolone sodium succinate (SOLU-MEDROL) 40 mg/mL injection 40 mg  40 mg Intravenous Q12H Sital Mody, MD   40 mg at 01/09/16 1200  . morphine 2 MG/ML injection 2 mg  2 mg Intravenous Q3H PRN Hillary Bow, MD   2 mg at 01/09/16 2205  . nicotine (NICODERM CQ - dosed in mg/24 hours) patch 21 mg  21 mg Transdermal Daily Lytle Butte, MD      . ondansetron [facility]) tablet 4 mg  4 mg Oral Q6H PRN Lytle Butte, MD       Or  . ondansetron [facility]) injection 4 mg  4 mg Intravenous Q6H PRN Lytle Butte, MD   4 mg at 01/08/16 9833  . piperacillin-tazobactam (ZOSYN) IVPB 3.375 g  3.375 g Intravenous Q8H Srikar Sudini, MD   3.375 g at 01/09/16 2205  . tiotropium (SPIRIVA) inhalation capsule 18 mcg  18 mcg Inhalation Daily Lytle Butte, MD   18 mcg at 01/08/16 0844  . vancomycin (VANCOCIN) IVPB 1000 mg/200 mL premix  1,000 mg Intravenous Q24H Lenis Noon, RPH   1,000 mg at 01/09/16 8250    OBJECTIVE: Vitals:   01/09/16 1324 01/09/16 2104  BP: (!) 92/45 114/60  Pulse: 93 (!) 103  Resp: 18 20  Temp: 97.7 F (36.5 C) 97.5 F (36.4 C)     Body mass index is 17.24  kg/m.    ECOG FS:3 - Symptomatic, >50% confined to bed  General: ill-appearing, mild respiratory distress. Eyes: Pink conjunctiva, anicteric sclera. HEENT: Normocephalic, moist mucous membranes, clear oropharnyx. Lungs: minimal breath sounds on left, diminished breath sounds on right. Heart: Regular rate and rhythm. No rubs, murmurs, or gallops. Abdomen: Soft, nontender, nondistended. No  organomegaly noted, normoactive bowel sounds. Musculoskeletal: No edema, cyanosis, or clubbing. Neuro: Alert, answering all questions appropriately. Cranial nerves grossly intact. Skin: No rashes or petechiae noted. Psych: Normal affect.   LAB RESULTS:  Lab Results  Component Value Date   NA 140 01/09/2016   K 4.1 01/09/2016   CL 110 01/09/2016   CO2 24 01/09/2016   GLUCOSE 128 (H) 01/09/2016   BUN 14 01/09/2016   CREATININE 0.48 01/09/2016   CALCIUM 8.7 (L) 01/09/2016   PROT 8.1 01/07/2016   ALBUMIN 3.1 (L) 01/07/2016   AST 24 01/07/2016   ALT 27 01/07/2016   ALKPHOS 103 01/07/2016   BILITOT 0.6 01/07/2016   GFRNONAA >60 01/09/2016   GFRAA >60 01/09/2016    Lab Results  Component Value Date   WBC 22.2 (H) 01/09/2016   NEUTROABS 20.1 (H) 01/07/2016   HGB 8.7 (L) 01/09/2016   HCT 27.5 (L) 01/09/2016   MCV 82.8 01/09/2016   PLT 546 (H) 01/09/2016     STUDIES: Dg Chest 2 View  Result Date: 01/07/2016 CLINICAL DATA:  Acute shortness of breath with cough. History of lung carcinoma. EXAM: CHEST  2 VIEW COMPARISON:  10/24/2015 FINDINGS: There is no longer complete opacification of left hemi thorax. There is left perihilar and lower lung zone coarse reticular opacity consistent with treatment related scarring. Superimposed infection is possible. Right lung is hyperexpanded. There are irregularly thickened interstitial markings in a most notable the right lung base, similar to the prior study. There is some hazy airspace opacity in the right medial lung base projecting in the right middle  lobe on the lateral view. Minimal left pleural effusion.  No right pleural effusion. No pneumothorax. Cardiac silhouette is normal in size.  No mediastinal masses. IMPRESSION: 1. Hazy opacity the right medial lung base suggests superimposed infection on chronic interstitial thickening. 2. Opacity in the left perihilar and lower lung zones likely treatment related scarring. Superimposed pneumonia is possible. 3. Minimal left pleural effusion. 4. No pulmonary edema. Electronically Signed   By: Lajean Manes M.D.   On: 01/07/2016 18:08   Ct Angio Chest Pe W And/or Wo Contrast  Result Date: 01/07/2016 CLINICAL DATA:  Worsening cough and shortness of breath acutely worsened last night, several episodes of blood tinged sputum, productive cough, tachypnea, hypoxia, question pulmonary embolism EXAM: CT ANGIOGRAPHY CHEST WITH CONTRAST TECHNIQUE: Multidetector CT imaging of the chest was performed using the standard protocol during bolus administration of intravenous contrast. Multiplanar CT image reconstructions and MIPs were obtained to evaluate the vascular anatomy. CONTRAST:  50 cc Isovue 370 IV COMPARISON:  PET-CT 11/07/2015, CT chest 10/22/2015 FINDINGS: Cardiovascular: Atherosclerotic calcifications aorta, proximal great vessels, and coronary arteries. Aorta upper normal caliber without aneurysm or dissection. No pericardial effusion. Pulmonary arteries well opacified and patent. No evidence of pulmonary embolism. Mediastinum/Nodes: Again identified tumor at the posterior LEFT hilum, poorly defined, appears increased since the previous study, estimated 4.1 x 3.3 cm image 52. This extends adjacent to the LEFT mainstem bronchus, esophagus, and aorta as well as questionably the LEFT main pulmonary artery. 9 mm RIGHT paratracheal node image 39. 10 mm LEFT paratracheal node image 40. Suspected LEFT hilar adenopathy confluent with the LEFT perihilar tumor. Lungs/Pleura: Patchy infiltrates in both lungs compatible with  infection/pneumonia, predominately central and peribronchial and prevascular in position. Subsegmental atelectasis and volume loss in LEFT lower lobe. Small LEFT pleural effusion. No RIGHT pleural effusion. No pneumothorax. Scattered central peribronchial thickening. Upper Abdomen: 10 mm low-attenuation lesion anteriorly in lateral  segment LEFT lobe liver question metastasis unchanged. No additional upper abdominal abnormalities. Musculoskeletal: Superior endplate compression fracture of T11 unchanged. No additional focal osseous lesions identified. Review of the MIP images confirms the above findings. IMPRESSION: No evidence of pulmonary embolism. LEFT perihilar tumor extending into LEFT hilum, appearing slightly larger than on the previous study. New BILATERAL pulmonary infiltrates compatible with infection/pneumonia. Volume loss and subsegmental atelectasis in LEFT lower lobe with small LEFT pleural effusion. Question confluent adenopathy at the LEFT pulmonary hilum with additional small normal to upper normal sized mediastinal nodes as above. Electronically Signed   By: Lavonia Dana M.D.   On: 01/07/2016 20:34   Dg Esophagus  Result Date: 12/19/2015 CLINICAL DATA:  Pt states feels like swallowing air, trouble swallowing solids AND liquids x 4 weeks, pt currently being treated for lung cancer, smoker x 45 years. EXAM: ESOPHOGRAM / BARIUM SWALLOW TECHNIQUE: Combined double contrast and single contrast examination performed using effervescent crystals, thick barium liquid. FLUOROSCOPY TIME:  Radiation Exposure Index (as provided by the fluoroscopic device): 2.7 mGy COMPARISON:  None. FINDINGS: There was normal pharyngeal anatomy and motility. 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 There is delayed transit of liquid barium through the stricture without complete obstruction. Left lung collapse with near complete opacification of the left lung. IMPRESSION: 1. Severe  high-grade narrowing of the mid thoracic esophagus in the subcarinal region. Left lung collapse with near complete opacification of the left lung. Electronically Signed   By: Kathreen Devoid   On: 12/19/2015 11:28   ASSESSMENT: 60 y.o. 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 Lung cancer: Patient currently receiving palliative single agent immunotherapy with Opdivo. She received cycle 4 approximately one week ago. As previously discussed with patient surgical and radiation no longer options for treatment. 2. Shortness of breath: Secondary to likely aspiration pneumonia. Swallow study noted. Patient currently being evaluated for PEG tube. 3. Sepsis: Cultures negative to date. Continue current antibiotics. 4. Weight loss/aspiration: Patient has agreed to PEG tube placement to improve her nutritional status and is actively being evaluated by GI and anesthesia. Her recent treatment of immunotherapy should not effect PEG placement. 5. Disposition: Hospice was discussed with patient, but she has declined and wishes to continue to actively pursue treatment for her lung cancer as well as PEG tube placement.  Will follow.  Lloyd Huger, MD   01/09/2016 10:41 PM

## 2016-01-09 NOTE — Progress Notes (Addendum)
[facility] [place] NAME: Evelyn Willis    MR#:  [id number]  DATE OF BIRTH:  30-Dec-1955  SUBJECTIVE:   Still SOB. Coughing constantly.  Swallow eval earlier and unable to swallow even liquids without regurgitation. Now NPO  Daughter here from [place]. REVIEW OF SYSTEMS:    Review of Systems  Constitutional: Positive for malaise/fatigue and weight loss. Negative for chills and fever.  HENT: Negative.  Negative for ear discharge, ear pain, hearing loss, nosebleeds and sore throat.   Eyes: Negative.  Negative for blurred vision and pain.  Respiratory: Positive for cough and shortness of breath. Negative for hemoptysis and wheezing.   Cardiovascular: Negative.  Negative for chest pain, palpitations and leg swelling.  Gastrointestinal: Positive for nausea. Negative for abdominal pain, blood in stool, diarrhea and vomiting.  Genitourinary: Negative.  Negative for dysuria.  Musculoskeletal: Negative.  Negative for back pain.  Skin: Negative.   Neurological: Positive for weakness. Negative for dizziness, tremors, speech change, focal weakness, seizures and headaches.  Endo/Heme/Allergies: Negative.  Does not bruise/bleed easily.  Psychiatric/Behavioral: Negative.  Negative for depression, hallucinations and suicidal ideas.    DRUG ALLERGIES:   Allergies  Allergen Reactions  . Aspirin Other (See Comments)    Pt gets bleeding ulcers when she takes this medication Bleeding ulcers  . Nsaids Other (See Comments)    Other reaction(s): Other (See Comments) Cannot take due to significant peptic ulcer disease. Cannot take due to significant peptic ulcer disease.    VITALS:  Blood pressure 123/63, pulse 94, temperature 97.7 F (36.5 C), temperature source Oral, resp. rate 20, height '5\' 6"'$  (1.676 m), weight 48.4 kg (106 lb 12.8 oz), SpO2 95 %.  PHYSICAL EXAMINATION:   Physical Exam  Constitutional: She is oriented to person, place, and  time. She appears distressed.  frail  HENT:  Head: Normocephalic.  Eyes: No scleral icterus.  Neck: Normal range of motion. No JVD present. No tracheal deviation present.  Cardiovascular: Regular rhythm.  Exam reveals no gallop and no friction rub.   No murmur heard. tachycardia  Pulmonary/Chest: Effort normal. No respiratory distress. She has no wheezes. She has no rales. She exhibits no tenderness.  Increased work of  Breathing Decreased with crackles at bases  Abdominal: Soft. Bowel sounds are normal. She exhibits no distension and no mass. There is no tenderness. There is no rebound and no guarding.  Musculoskeletal: Normal range of motion. She exhibits no edema.  Lymphadenopathy:    She has no cervical adenopathy.  Neurological: She is alert and oriented to person, place, and time.  Skin: Skin is warm. No rash noted. No erythema.  Psychiatric: Affect and judgment normal.      LABORATORY PANEL:   CBC  Recent Labs Lab 01/09/16 0424  WBC 22.2*  HGB 8.7*  HCT 27.5*  PLT 546*   ------------------------------------------------------------------------------------------------------------------  Chemistries   Recent Labs Lab 01/07/16 1743  01/09/16 0424  NA 139  < > 140  K 4.0  < > 4.1  CL 104  < > 110  CO2 24  < > 24  GLUCOSE 105*  < > 128*  BUN 17  < > 14  CREATININE 0.59  < > 0.48  CALCIUM 9.1  < > 8.7*  MG 1.9  --   --   AST 24  --   --   ALT 27  --   --   ALKPHOS 103  --   --   BILITOT  0.6  --   --   < > = values in this interval not displayed. ------------------------------------------------------------------------------------------------------------------  Cardiac Enzymes  Recent Labs Lab 01/07/16 1743  TROPONINI <0.03   ------------------------------------------------------------------------------------------------------------------  RADIOLOGY:  Dg Chest 2 View  Result Date: 01/07/2016 CLINICAL DATA:  Acute shortness of breath with cough.  History of lung carcinoma. EXAM: CHEST  2 VIEW COMPARISON:  10/24/2015 FINDINGS: There is no longer complete opacification of left hemi thorax. There is left perihilar and lower lung zone coarse reticular opacity consistent with treatment related scarring. Superimposed infection is possible. Right lung is hyperexpanded. There are irregularly thickened interstitial markings in a most notable the right lung base, similar to the prior study. There is some hazy airspace opacity in the right medial lung base projecting in the right middle lobe on the lateral view. Minimal left pleural effusion.  No right pleural effusion. No pneumothorax. Cardiac silhouette is normal in size.  No mediastinal masses. IMPRESSION: 1. Hazy opacity the right medial lung base suggests superimposed infection on chronic interstitial thickening. 2. Opacity in the left perihilar and lower lung zones likely treatment related scarring. Superimposed pneumonia is possible. 3. Minimal left pleural effusion. 4. No pulmonary edema. Electronically Signed   By: Lajean Manes M.D.   On: 01/07/2016 18:08   Ct Angio Chest Pe W And/or Wo Contrast  Result Date: 01/07/2016 CLINICAL DATA:  Worsening cough and shortness of breath acutely worsened last night, several episodes of blood tinged sputum, productive cough, tachypnea, hypoxia, question pulmonary embolism EXAM: CT ANGIOGRAPHY CHEST WITH CONTRAST TECHNIQUE: Multidetector CT imaging of the chest was performed using the standard protocol during bolus administration of intravenous contrast. Multiplanar CT image reconstructions and MIPs were obtained to evaluate the vascular anatomy. CONTRAST:  50 cc Isovue 370 IV COMPARISON:  PET-CT 11/07/2015, CT chest 10/22/2015 FINDINGS: Cardiovascular: Atherosclerotic calcifications aorta, proximal great vessels, and coronary arteries. Aorta upper normal caliber without aneurysm or dissection. No pericardial effusion. Pulmonary arteries well opacified and patent. No  evidence of pulmonary embolism. Mediastinum/Nodes: Again identified tumor at the posterior LEFT hilum, poorly defined, appears increased since the previous study, estimated 4.1 x 3.3 cm image 52. This extends adjacent to the LEFT mainstem bronchus, esophagus, and aorta as well as questionably the LEFT main pulmonary artery. 9 mm RIGHT paratracheal node image 39. 10 mm LEFT paratracheal node image 40. Suspected LEFT hilar adenopathy confluent with the LEFT perihilar tumor. Lungs/Pleura: Patchy infiltrates in both lungs compatible with infection/pneumonia, predominately central and peribronchial and prevascular in position. Subsegmental atelectasis and volume loss in LEFT lower lobe. Small LEFT pleural effusion. No RIGHT pleural effusion. No pneumothorax. Scattered central peribronchial thickening. Upper Abdomen: 10 mm low-attenuation lesion anteriorly in lateral segment LEFT lobe liver question metastasis unchanged. No additional upper abdominal abnormalities. Musculoskeletal: Superior endplate compression fracture of T11 unchanged. No additional focal osseous lesions identified. Review of the MIP images confirms the above findings. IMPRESSION: No evidence of pulmonary embolism. LEFT perihilar tumor extending into LEFT hilum, appearing slightly larger than on the previous study. New BILATERAL pulmonary infiltrates compatible with infection/pneumonia. Volume loss and subsegmental atelectasis in LEFT lower lobe with small LEFT pleural effusion. Question confluent adenopathy at the LEFT pulmonary hilum with additional small normal to upper normal sized mediastinal nodes as above. Electronically Signed   By: Lavonia Dana M.D.   On: 01/07/2016 20:34   ASSESSMENT AND PLAN:    60 year old female with Squamous cell carcinoma of the hilus of left lung (stage IIa, T1b,N1,M0), now with either  recurrent or persistent disease on chemotherapy, severe high-grade narrowing at the midthoracic esophagus likely due to external  compression of known left lung mass and MS presents with sepsis and pneumonia.  * Sepsis Bilateral aspiration pneumonia/HCAP Continue ZOSYN and vancomycin.  * Acute hypoxic resp failure with HCAP - Worsening MRSA PCR  IV steroids Nebs  * Squamous cell carcinoma of left lung Stage 2A Discussed with Dr. Grayland Ormond for prognosis Will see patient  *severe high-grade narrowing at the midthoracic esophagus: This is likely due to external compression from left lung mass. NPO  * History of MS: Continue pain management.  * History of atrial fibrillation: Continue amiodarone   * Anemia: Continue to monitor hemoglobin. Patient does have chronic anemia.   Management plans discussed with the patient and family and they are in agreement.  CODE STATUS: FULL CODE  Discussed with patient regarding code status. She is unable to decide. Explained DNR vs full code. I have asked Dr. Grayland Ormond to see patient. Discussed with Dr. Stevenson Clinch with Pulmonary.  Critical care TOTAL TIME TAKING CARE OF THIS PATIENT: 35 minutes.  High risk of intubation.   Hillary Bow R M.D on 01/09/2016 at 11:49 AM  Between 7am to 6pm - Pager - [phone]  After 6pm go to [email or web address] - password [email or web address] [facility]  Office  [phone]  CC: Primary care physician; BABAOFF, Caryl Bis, MD  Note: This dictation was prepared with Dragon dictation along with smaller phrase technology. Any transcriptional errors that result from this process are unintentional.

## 2016-01-09 NOTE — Evaluation (Signed)
Clinical/Bedside Swallow Evaluation Patient Details  Name: Evelyn Willis MRN: [id number] Date of Birth: [birth year]  Today's Date: 01/09/2016 Time: SLP Start Time (ACUTE ONLY): 0900 SLP Stop Time (ACUTE ONLY): 1000 SLP Time Calculation (min) (ACUTE ONLY): 60 min  Past Medical History:  Past Medical History:  Diagnosis Date  . Atrial fibrillation ([place])   . Cancer [facility]) 2016   Left lung cancer, radiation, chemo  . COPD (chronic obstructive pulmonary disease) ([place])   . Coronary artery disease    said- stent was placed 10 yrs ago from now( 2017) in [place], [place]  . Depression   . Emphysema lung ([place])   . Hematuria   . Multiple sclerosis ([place])   . Renal calculi    Past Surgical History:  Past Surgical History:  Procedure Laterality Date  . EXTRACORPOREAL SHOCK WAVE LITHOTRIPSY Right 11/03/2015   Procedure: EXTRACORPOREAL SHOCK WAVE LITHOTRIPSY (ESWL);  Surgeon: Hollice Espy, MD;  Location: [facility] ORS;  Service: Urology;  Laterality: Right;  . stomach ulcer removal  2011  . TOTAL HIP ARTHROPLASTY  2011   HPI:  Pt is a 60 y.o. female with a known history of Lung cancer Stage 2A, She on active chemo treatment presenting with shortness of breath and cough. She states 2 day duration of continuing cough with associated posttussive emesis. Cough productive green sputum denies fevers chills positive fatigue and weakness. Daughter and pt endorse regurgitation of foods/liquids when consumed d/t the Esophageal stricture secondary to Cancer pressing on the Esophagus. This has worsened in the past ~2+ weeks.  Pt also has a h/o MS, emphysema, COPD, CAD, depression.  Daughter is concerned re: pt's oral intake and poor nutrition/hydration recently.    Assessment / Plan / Recommendation Clinical Impression  Pt appears at increased risk for aspiration d/t declined Pulmonary status and overall weakness/fatigue as well as d/t her baseline Esophageal dysmotility and narrowing resulting in pt regurgitating  foods/liquids per her, and Daughter's, report. Thregurgitation and dysphagia has increased in severity in the recent 2-3 weeks w/ reduced oral intake per report. Pt presents w/ a chronic, congested cough at baseline prior to the po trials presented. Pt consumed a few single ice chips w/ no change in presentation or severity of the coughing. However, when given 1 trial sip of thin liquid via cup, pt's cough response appeared more immediate and severe than pt's chronic coughing - no further trials given. MD consulted re: concern for pharyngeal phase dysphagia but more importantly the Esophageal phase dysphagia w/ regurgitation. Recommended NPO status w/ alternative means for medications (IV) for support at this time. Recommended further discussion b/t MD and pt/family re: poc. ST services will f/u tomorrow w/ ongoing assessment. Recommend frequent oral care for hygiene and stimulation for swallowing. Would recommend ice chips per MD ok for pleasure post oral care w/ aspiration precautions. NSG updated.     Aspiration Risk  Moderate aspiration risk    Diet Recommendation  NPO at this time; frequent oral care for hygiene and stimulation for swallowing. Pleasure ice chips per MD ok, aspiration precautions included.  Medication Administration: Via alternative means    Other  Recommendations Recommended Consults: Consider GI evaluation;Consider esophageal assessment (Palliative Care consult) Oral Care Recommendations: Oral care QID;Patient independent with oral care;Staff/trained caregiver to provide oral care;Oral care prior to ice chip/H20 Other Recommendations:  (TBD)   Follow up Recommendations   (TBD)    Frequency and Duration min 3x week  2 weeks       Prognosis Prognosis for  Safe Diet Advancement: Guarded Barriers to Reach Goals: Severity of deficits;Time post onset      Swallow Study   General Date of Onset: 01/07/16 HPI: Pt is a 60 y.o. female with a known history of Lung cancer Stage  2A, She on active chemo treatment presenting with shortness of breath and cough. She states 2 day duration of continuing cough with associated posttussive emesis. Cough productive green sputum denies fevers chills positive fatigue and weakness. Daughter and pt endorse regurgitation of foods/liquids when consumed d/t the Esophageal stricture secondary to Cancer pressing on the Esophagus. This has worsened in the past ~2+ weeks.  Pt also has a h/o MS, emphysema, COPD, CAD, depression.  Daughter is concerned re: pt's oral intake and poor nutrition/hydration recently.  Type of Study: Bedside Swallow Evaluation Previous Swallow Assessment: MBSS in 2011 for FTT, WEIGHT LOSS, DIFFICULTY EATING. A Barium study on 12/19/15 indicated normal pharyngeal anatomy and motility. Severe high-grade. narrowing of the mid thoracic esophagus in the subcarinal region. There is delayed transit of liquid barium through the stricture without complete obstruction. Left lung collapse with near complete opacification of the left lung. Daughter and pt endorse regurgitation of foods/liquids when consumed.  Diet Prior to this Study: Regular;Thin liquids (soft foods per report;  poor intake) Temperature Spikes Noted: No (wbc elevated - 22.2) Respiratory Status: Nasal cannula (2-6 liters) History of Recent Intubation: No Behavior/Cognition: Alert;Cooperative;Pleasant mood (appeared fatigued w/ constant, ongoing coughing) Oral Cavity Assessment:  (deferred as pt and Daughter requested ice chips) Oral Care Completed by SLP: Recent completion by staff Oral Cavity - Dentition: Adequate natural dentition Vision: Functional for self-feeding Self-Feeding Abilities: Able to feed self Patient Positioning: Upright in bed Baseline Vocal Quality: Breathy (gravely) Volitional Cough: Strong;Congested Volitional Swallow: Able to elicit    Oral/Motor/Sensory Function Overall Oral Motor/Sensory Function: Within functional limits   Ice Chips Ice  chips: Within functional limits Presentation: Spoon (4 trials ) Other Comments: chronic coughing did not increase in severity or frequency   Thin Liquid Thin Liquid: Impaired Presentation: Cup (1 trial - between chronic coughing when relaxed) Oral Phase Impairments:  (none) Oral Phase Functional Implications:  (none) Pharyngeal  Phase Impairments: Cough - Immediate Other Comments: this cough response appeared more immediate and severe than pt's chronic coughing - no further trials given.    Nectar Thick Nectar Thick Liquid: Not tested   Honey Thick Honey Thick Liquid: Not tested   Puree Puree: Not tested Other Comments: pt stated she regurgitated the small tsp of applesauce she attempted during her breakfast meal this morning   Solid   GO   Solid: Not tested       Orinda Kenner, MS, CCC-SLP  Watson,Katherine 01/09/2016,10:32 AM

## 2016-01-09 NOTE — Consult Note (Signed)
Consultation Referring Provider: Dr.Sudini  Primary Care Physician:  Marcello Fennel, MD Consulting  Gastroenterologist:   Marge Duncans      Reason for Consultation: Peg Tube            HPI:   Evelyn Willis is a 60 y.o. female  with history of depression,  multiple sclerosis, CAD and remote stent placement, Afib admission with amiodarone 07/2015, COPD, Tobacco, chronic pain, PUD complicated by perforated gastric ulcer requiring surgical repair 2011, joint replacement 2012, and  squamous cell carcinoma the lung diagnosed  in June 2016 on active treatment. She presented to the ED 2 days ago with shortness of breath, cough productive green sputum, positive fatigue and weakness. She was diagnosed with bilateral pneumonia with sepsis and COPD exacerbation.  Concern for possible aspiration given external compression from the left sided lung mass and coughing episodes leading to vomiting. She has been on Aspiration precautions and swallowing evaluation recommended. There is a request for PEG tube placement.  She has had progressive problems with dysphagia, painful swallowing, food coming back up, some vomiting . Swallow study showed severe high-grade narrowing at the midthoracic esophagus likely secondary to external compression of her known left lung mass.  Mild buccal mucosa thrush with use of Magic Mouthwash.  Per chart review, pt's daughter is extremely concerned over poor nutritional intake. Pt was previously on a dysphagia 3 diet with nectar thick liquids, however, pt is now NPO related to decreased respiratory status and fatigue. Baseline weight around 125 lb  and now down 103 lb.  Medications reviewed and include megace, magic mouthwash, and Remeron. Medications have been transferred to IV form due to NPO status and dysphagia.   Past Medical History:  Diagnosis Date  . Atrial fibrillation ([place])   . Cancer [facility]) 2016   Left lung cancer, radiation, chemo  . COPD (chronic obstructive  pulmonary disease) ([place])   . Coronary artery disease    said- stent was placed 10 yrs ago from now( 2017) in [place], [place]  . Depression   . Emphysema lung ([place])   . Hematuria   . Multiple sclerosis ([place])   . Renal calculi     Past Surgical History:  Procedure Laterality Date  . EXTRACORPOREAL SHOCK WAVE LITHOTRIPSY Right 11/03/2015   Procedure: EXTRACORPOREAL SHOCK WAVE LITHOTRIPSY (ESWL);  Surgeon: Hollice Espy, MD;  Location: [facility] ORS;  Service: Urology;  Laterality: Right;  . stomach ulcer removal  2011  . TOTAL HIP ARTHROPLASTY  2011    Family History  Problem Relation Age of Onset  . CAD Mother   . Atrial fibrillation Mother   . Heart attack Neg Hx   . Hypertension Neg Hx   . Stroke Neg Hx   . Kidney disease Neg Hx      Social History  Substance Use Topics  . Smoking status: Former Smoker    Packs/day: 1.50    Years: 45.00    Quit date: 07/26/2015  . Smokeless tobacco: Former Systems developer     Comment: x 2 weeks  . Alcohol use 0.0 oz/week    Prior to Admission medications   Medication Sig Start Date End Date Taking? Authorizing Provider  albuterol (PROVENTIL HFA;VENTOLIN HFA) 108 (90 Base) MCG/ACT inhaler Inhale 2 puffs into the lungs every 6 (six) hours as needed for wheezing or shortness of breath.  07/06/15  Yes Historical Provider, MD  amiodarone (PACERONE) 200 MG tablet Take 200 mg by mouth 2 (two) times daily.  08/13/15  Yes Historical  Provider, MD  amitriptyline (ELAVIL) 75 MG tablet Take 75 mg by mouth at bedtime.  11/24/15  Yes Historical Provider, MD  amphetamine-dextroamphetamine (ADDERALL) 20 MG tablet Take 1 tablet by mouth every morning. 01/03/16  Yes Historical Provider, MD  buPROPion (WELLBUTRIN XL) 150 MG 24 hr tablet Take 150 mg by mouth at bedtime.  07/19/15  Yes Historical Provider, MD  Diphenhyd-Hydrocort-Nystatin (FIRST-DUKES MOUTHWASH) SUSP Use as directed 5 mLs in the mouth or throat 4 (four) times daily. 12/26/15  Yes Lloyd Huger, MD   HYDROcodone-acetaminophen (NORCO) 10-325 MG tablet Take 2 tablets by mouth 3 (three) times daily as needed for moderate pain or severe pain.  12/02/15  Yes Historical Provider, MD  hydrocortisone (CORTEF) 20 MG tablet Take 40 mg by mouth 3 (three) times daily. 12/26/15  Yes Historical Provider, MD  megestrol (MEGACE) 400 MG/10ML suspension Take 5 mLs (200 mg total) by mouth daily. 01/03/16  Yes Lequita Asal, MD  metoprolol succinate (TOPROL-XL) 25 MG 24 hr tablet Take 25 mg by mouth every morning.  08/15/15  Yes Historical Provider, MD  mirtazapine (REMERON) 15 MG tablet TAKE 1 TABLET BY MOUTH AT BEDTIME 11/24/15  Yes Historical Provider, MD  morphine (MS CONTIN) 15 MG 12 hr tablet Take 15 mg by mouth every 12 (twelve) hours.    Yes Historical Provider, MD  Naloxone HCl (NARCAN NA) Place 1 application into the nose every 10 (ten) minutes as needed. For respiratory depression, unconsciousness, opioid overdose. 12/02/15  Yes Historical Provider, MD  tiotropium (SPIRIVA) 18 MCG inhalation capsule Place 18 mcg into inhaler and inhale every morning.  11/05/14  Yes Historical Provider, MD  docusate sodium (COLACE) 100 MG capsule Take 1 capsule (100 mg total) by mouth 2 (two) times daily. Patient not taking: Reported on 01/07/2016 11/03/15   Hollice Espy, MD  fludrocortisone (FLORINEF) 0.1 MG tablet Take 1 tablet (0.1 mg total) by mouth daily. 11/16/15   Minna Merritts, MD  lidocaine-prilocaine (EMLA) cream Apply to affected area once Patient not taking: Reported on 01/07/2016 11/17/15   Lloyd Huger, MD  nicotine (NICODERM CQ - DOSED IN MG/24 HOURS) 21 mg/24hr patch Place 21 mg onto the skin daily.    Historical Provider, MD    Current Facility-Administered Medications  Medication Dose Route Frequency Provider Last Rate Last Dose  . 0.9 %  sodium chloride infusion   Intravenous Continuous Srikar Sudini, MD 50 mL/hr at 01/09/16 1513    . acetaminophen (TYLENOL) tablet 650 mg  650 mg Oral Q6H PRN Lytle Butte, MD       Or  . acetaminophen (TYLENOL) suppository 650 mg  650 mg Rectal Q6H PRN Lytle Butte, MD      . albuterol (PROVENTIL) (2.5 MG/3ML) 0.083% nebulizer solution 2.5 mg  2.5 mg Nebulization Q2H PRN Harrie Foreman, MD      . amiodarone (PACERONE) tablet 200 mg  200 mg Oral Daily Lytle Butte, MD   200 mg at 01/08/16 0844  . amitriptyline (ELAVIL) tablet 75 mg  75 mg Oral QHS Lytle Butte, MD   75 mg at 01/08/16 2136  . benzonatate (TESSALON) capsule 100 mg  100 mg Oral TID PRN Alexis Hugelmeyer, DO   100 mg at 01/09/16 0046  . bisacodyl (DULCOLAX) suppository 10 mg  10 mg Rectal Daily PRN Srikar Sudini, MD      . budesonide (PULMICORT) nebulizer solution 0.5 mg  0.5 mg Nebulization BID Flora Lipps, MD   0.5  mg at 01/09/16 0744  . buPROPion (WELLBUTRIN XL) 24 hr tablet 150 mg  150 mg Oral Daily Lytle Butte, MD   150 mg at 01/08/16 0844  . enoxaparin (LOVENOX) injection 40 mg  40 mg Subcutaneous Q24H Lytle Butte, MD   40 mg at 01/08/16 2138  . fludrocortisone (FLORINEF) tablet 0.1 mg  0.1 mg Oral Daily Lytle Butte, MD   0.1 mg at 01/08/16 0844  . guaiFENesin-codeine 100-10 MG/5ML solution 10 mL  10 mL Oral Q4H PRN Lytle Butte, MD   10 mL at 01/08/16 2147  . ipratropium-albuterol (DUONEB) 0.5-2.5 (3) MG/3ML nebulizer solution 3 mL  3 mL Nebulization Q4H Lytle Butte, MD   3 mL at 01/09/16 1159  . LORazepam (ATIVAN) injection 0.5 mg  0.5 mg Intravenous Q4H PRN Harrie Foreman, MD   0.5 mg at 01/09/16 1454  . magic mouthwash  5 mL Oral QID Lenis Noon, RPH   5 mL at 01/08/16 2138  . megestrol (MEGACE) 400 MG/10ML suspension 200 mg  200 mg Oral Daily Lytle Butte, MD   200 mg at 01/08/16 0844  . methylPREDNISolone sodium succinate (SOLU-MEDROL) 40 mg/mL injection 40 mg  40 mg Intravenous Q12H Bettey Costa, MD   40 mg at 01/09/16 0046  . morphine 2 MG/ML injection 2 mg  2 mg Intravenous Q3H PRN Srikar Sudini, MD      . nicotine (NICODERM CQ - dosed in mg/24 hours) patch 21  mg  21 mg Transdermal Daily Lytle Butte, MD      . ondansetron [facility]) tablet 4 mg  4 mg Oral Q6H PRN Lytle Butte, MD       Or  . ondansetron [facility]) injection 4 mg  4 mg Intravenous Q6H PRN Lytle Butte, MD   4 mg at 01/08/16 7628  . piperacillin-tazobactam (ZOSYN) IVPB 3.375 g  3.375 g Intravenous Q8H Srikar Sudini, MD   3.375 g at 01/09/16 1400  . tiotropium (SPIRIVA) inhalation capsule 18 mcg  18 mcg Inhalation Daily Lytle Butte, MD   18 mcg at 01/08/16 0844  . vancomycin (VANCOCIN) IVPB 1000 mg/200 mL premix  1,000 mg Intravenous Q24H Lenis Noon, RPH   1,000 mg at 01/09/16 3151    Allergies as of 01/07/2016 - Review Complete 01/07/2016  Allergen Reaction Noted  . Aspirin Other (See Comments) 05/19/2015  . Nsaids Other (See Comments) 08/15/2015     Review of Systems:    A 12 system review was obtained and all negative except where noted in HPI.    Physical Exam:  Vital signs in last 24 hours: Temp:  [97.7 F (36.5 C)-98.7 F (37.1 C)] 97.7 F (36.5 C) (08/14 1324) Pulse Rate:  [58-102] 93 (08/14 1324) Resp:  [18-20] 18 (08/14 1324) BP: (84-123)/(45-63) 92/45 (08/14 1324) SpO2:  [94 %-100 %] 99 % (08/14 1324) Last BM Date: 01/04/16  General:  Thin, chronically ill, Frail, disheveled, female,  whispering with conversation, oriented to place and events and visit with her family. Head:  Head without obvious abnormality, atraumatic  Eyes:   Conjunctiva pink, sclera anicteric   ENT:   Mucosa moist Neck:   Supple  Lungs: Clear to auscultation bilaterally, respirations unlabored, congest cough with sputum Heart:     Normal S1S2, no rubs, murmurs, gallops. Abdomen: Soft, non-tender, no hepatosplenomegaly, hernia, or mass and BS normal, old scar noted from gastric ulcer repair Rectal: Deferred Lymph:  No cervical or supraclavicular adenopathy.  Extremities:   No edema, cyanosis, or clubbing Skin  Skin color, texture, turgor normal, no rashes or lesions Neuro:  A&O x  3.  follows commands Psych:            Drowsy,   Data Reviewed:  LAB RESULTS:  Recent Labs  01/08/16 0356 01/09/16 0424  WBC 16.4* 22.2*  HGB 8.1* 8.7*  HCT 23.0* 27.5*  PLT 447* 546*   BMET  Recent Labs  01/08/16 0356 01/09/16 0424  NA 139 140  K 3.4* 4.1  CL 110 110  CO2 23 24  GLUCOSE 109* 128*  BUN 11 14  CREATININE 0.45 0.48  CALCIUM 7.7* 8.7*   LFT  Recent Labs  01/07/16 1743  PROT 8.1  ALBUMIN 3.1*  AST 24  ALT 27  ALKPHOS 103  BILITOT 0.6   PT/INR No results for input(s): LABPROT, INR in the last 72 hours.  STUDIES: Dg Chest 2 View  Result Date: 01/07/2016 CLINICAL DATA:  Acute shortness of breath with cough. History of lung carcinoma. EXAM: CHEST  2 VIEW COMPARISON:  10/24/2015 FINDINGS: There is no longer complete opacification of left hemi thorax. There is left perihilar and lower lung zone coarse reticular opacity consistent with treatment related scarring. Superimposed infection is possible. Right lung is hyperexpanded. There are irregularly thickened interstitial markings in a most notable the right lung base, similar to the prior study. There is some hazy airspace opacity in the right medial lung base projecting in the right middle lobe on the lateral view. Minimal left pleural effusion.  No right pleural effusion. No pneumothorax. Cardiac silhouette is normal in size.  No mediastinal masses. IMPRESSION: 1. Hazy opacity the right medial lung base suggests superimposed infection on chronic interstitial thickening. 2. Opacity in the left perihilar and lower lung zones likely treatment related scarring. Superimposed pneumonia is possible. 3. Minimal left pleural effusion. 4. No pulmonary edema. Electronically Signed   By: Lajean Manes M.D.   On: 01/07/2016 18:08   Ct Angio Chest Pe W And/or Wo Contrast  Result Date: 01/07/2016 CLINICAL DATA:  Worsening cough and shortness of breath acutely worsened last night, several episodes of blood tinged sputum,  productive cough, tachypnea, hypoxia, question pulmonary embolism EXAM: CT ANGIOGRAPHY CHEST WITH CONTRAST TECHNIQUE: Multidetector CT imaging of the chest was performed using the standard protocol during bolus administration of intravenous contrast. Multiplanar CT image reconstructions and MIPs were obtained to evaluate the vascular anatomy. CONTRAST:  50 cc Isovue 370 IV COMPARISON:  PET-CT 11/07/2015, CT chest 10/22/2015 FINDINGS: Cardiovascular: Atherosclerotic calcifications aorta, proximal great vessels, and coronary arteries. Aorta upper normal caliber without aneurysm or dissection. No pericardial effusion. Pulmonary arteries well opacified and patent. No evidence of pulmonary embolism. Mediastinum/Nodes: Again identified tumor at the posterior LEFT hilum, poorly defined, appears increased since the previous study, estimated 4.1 x 3.3 cm image 52. This extends adjacent to the LEFT mainstem bronchus, esophagus, and aorta as well as questionably the LEFT main pulmonary artery. 9 mm RIGHT paratracheal node image 39. 10 mm LEFT paratracheal node image 40. Suspected LEFT hilar adenopathy confluent with the LEFT perihilar tumor. Lungs/Pleura: Patchy infiltrates in both lungs compatible with infection/pneumonia, predominately central and peribronchial and prevascular in position. Subsegmental atelectasis and volume loss in LEFT lower lobe. Small LEFT pleural effusion. No RIGHT pleural effusion. No pneumothorax. Scattered central peribronchial thickening. Upper Abdomen: 10 mm low-attenuation lesion anteriorly in lateral segment LEFT lobe liver question metastasis unchanged. No additional upper abdominal abnormalities. Musculoskeletal: Superior endplate compression  fracture of T11 unchanged. No additional focal osseous lesions identified. Review of the MIP images confirms the above findings. IMPRESSION: No evidence of pulmonary embolism. LEFT perihilar tumor extending into LEFT hilum, appearing slightly larger than  on the previous study. New BILATERAL pulmonary infiltrates compatible with infection/pneumonia. Volume loss and subsegmental atelectasis in LEFT lower lobe with small LEFT pleural effusion. Question confluent adenopathy at the LEFT pulmonary hilum with additional small normal to upper normal sized mediastinal nodes as above. Electronically Signed   By: Lavonia Dana M.D.   On: 01/07/2016 20:34   CLINICAL DATA:  Pt states feels like swallowing air, trouble swallowing solids AND liquids x 4 weeks, pt currently being treated for lung cancer, smoker x 45 years. EXAM: ESOPHOGRAM / BARIUM SWALLOW TECHNIQUE: Combined double contrast and single contrast examination performed using effervescent crystals, thick barium liquid. FLUOROSCOPY TIME:  Radiation Exposure Index (as provided by the fluoroscopic device): 2.7 mGy COMPARISON:  None. FINDINGS: There was normal pharyngeal anatomy and motility. 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 There is delayed transit of liquid barium through the stricture without complete obstruction. Left lung collapse with near complete opacification of the left lung. IMPRESSION: 1. 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 Left lung collapse with near complete opacification of the left lung. Electronically Signed   By: Kathreen Devoid   On: 12/19/2015 11:28  Assessment:  ENAS WINCHEL is a 60 y.o. Lung cancer Stage 2A, - on active chemo treatment presenting with shortness of breath and cough and new bilat pneumonia with sepsis, concern about aspiration and NPO. She has had progressive problems with dysphagia, painful swallowing, food coming back up, some vomiting . Swallow study showed severe high-grade narrowing at the midthoracic esophagus likely secondary to external compression of her known left lung mass. Weight loss associated with poor intake. Request for PEG tube.   Plan:   Recommend anesthesia consult for sedation. Dr.Francesco Provencal and I in to talk to the family about the PEG tube. Further GI recommendations pending the ability to safely sedate her given her pulmonary problems.    This case was discussed with Dr. Manya Silvas in collaboration of care. Thank you for the consultation.  These services provided by Denice Paradise RN, MSN, ANP-BC under collaborative practice agreement with Manya Silvas, MD.  01/09/2016, 3:54 PM

## 2016-01-09 NOTE — Consult Note (Signed)
Consultation Note Date: 01/09/2016   Patient Name: Evelyn Willis  DOB: Mar 21, 1956  MRN: [id number]  Age / Sex: 60 y.o., female  PCP: Evelyn Late, MD Referring Physician: Hillary Bow, MD  Reason for Consultation: Establishing goals of care and Psychosocial/spiritual support  HPI/Patient Profile: 60 y.o. 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  Reported several days  of of worsening cough and increased shortness of breath.  She states that she is worn out.  Coughing causes nausea and vomiting.  She denies aspiration.  She has had a fever of 100.x and intermittent chills.  Sputum is thick with some blood.  She has been unable to eat x 2 days.    In  the emergency room initial O2 sats were in the mid 70s which improved with oxygen.  CXR revealed hazy opacity in the right medial lung base suggests superimposed infection on chronic interstitial thickening.    Chest CT 01-07-16  IMPRESSION: No evidence of pulmonary embolism.  LEFT perihilar tumor extending into LEFT hilum, appearing slightly larger than on the previous study.  New BILATERAL pulmonary infiltrates compatible with infection/pneumonia.  Volume loss and subsegmental atelectasis in LEFT lower lobe with small LEFT pleural effusion.  Question confluent adenopathy at the LEFT pulmonary hilum with additional small normal to upper normal sized mediastinal nodes as above.  She met sepsis criteria by leukocytosis, hear rate, and respiratory rate. Lactic acid was 2.1.  She was pan cultured and begun on broad spectrum antibiotics (Cefepime and vancomycin).   Faced with advanced directive decisions and anticipatory care needs.   Clinical Assessment and Goals of Care:    This NP Evelyn Willis reviewed medical records, received report from team,  assessed the patient and then meet at the patient's bedside along with  Her son and daughter and sister and brother in law  to discuss diagnosis, prognosis, GOC, EOL wishes disposition and options.  A detailed discussion was had today regarding advanced directives.  Concepts specific to code status, artifical feeding and hydration, continued IV antibiotics and rehospitalization was had.  The difference between a aggressive medical intervention path  and a palliative comfort care path for this patient at this time was had.  Values and goals of care important to patient and family were attempted to be elicited.   Natural trajectory and expectations at EOL were discussed.  Questions and concerns addressed.   Family encouraged to call with questions or concerns.  PMT will continue to support holistically.   SUMMARY OF RECOMMENDATIONS    Code Status/Advance Care Planning:  Full code-encouraged to consider DNR status knowing poor outcomes in similar patients.   Palliative Prophylaxis:   Aspiration, Bowel Regimen, Frequent Pain Assessment and Oral Care  Additional Recommendations (Limitations, Scope, Preferences):  Patient is open to all offered and available medical interventions to  prolong life.    Daughter is asking specifically about consideration for artificial feeding source.   Psycho-social/Spiritual:  Additional Recommendations: Education on Hospice   This was a difficult conversation for this family.  The medical situation is complicated by layers of psych-social issues.  Daughter/Evelyn Willis throughout the conversation was agitated with raised voices and cussing.  She is frustrated with coordination of care and loudly want to know "what is going on here, what are the doctors doing to treat my mother, she deserve more".  Patient struggles with her diagnosis and facing her own mortality, "I always thought I could come to the hospital and they would fix it", treatment options and  anticipatory care needs.   Patient's elderly father, whom the patient has been caring for, is currently being evaluated for SNF placement.    All family agree that the patient cannot be home without more support.  Daughter is suggesting taking her mother to [place] to live with her.  Prognosis:   < 6 months, likely less and dependant on desire for life prolonging interventions   Discharge Planning: To Be Determined      Primary Diagnoses: Present on Admission: **None**   I have reviewed the medical record, interviewed the patient and family, and examined the patient. The following aspects are pertinent.  Past Medical History:  Diagnosis Date  . Atrial fibrillation ([place])   . Cancer [facility]) 2016   Left lung cancer, radiation, chemo  . COPD (chronic obstructive pulmonary disease) ([place])   . Coronary artery disease    said- stent was placed 10 yrs ago from now( 2017) in [place]  . Depression   . Emphysema lung ([place])   . Hematuria   . Multiple sclerosis ([place])   . Renal calculi    Social History   Social History  . Marital status: Single    Spouse name: N/A  . Number of children: N/A  . Years of education: N/A   Occupational History  . retired    Social History Main Topics  . Smoking status: Former Smoker    Packs/day: 1.50    Years: 45.00    Quit date: 07/26/2015  . Smokeless tobacco: Former Systems developer     Comment: x 2 weeks  . Alcohol use 0.0 oz/week  . Drug use: No  . Sexual activity: Not Asked   Other Topics Concern  . None   Social History Narrative  . None   Family History  Problem Relation Age of Onset  . CAD Mother   . Atrial fibrillation Mother   . Heart attack Neg Hx   . Hypertension Neg Hx   . Stroke Neg Hx   . Kidney disease Neg Hx    Scheduled Meds: . amiodarone  200 mg Oral Daily  . amitriptyline  75 mg Oral QHS  . budesonide (PULMICORT) nebulizer solution  0.5 mg Nebulization BID  . buPROPion  150 mg Oral Daily  . enoxaparin  (LOVENOX) injection  40 mg Subcutaneous Q24H  . fludrocortisone  0.1 mg Oral Daily  . ipratropium-albuterol  3 mL Nebulization Q4H  . magic mouthwash  5 mL Oral QID  . megestrol  200 mg Oral Daily  . methylPREDNISolone (SOLU-MEDROL) injection  40 mg Intravenous Q12H  . nicotine  21 mg Transdermal Daily  . piperacillin-tazobactam (ZOSYN)  IV  3.375 g Intravenous Q8H  . tiotropium  18 mcg Inhalation Daily  . vancomycin  1,000 mg Intravenous Q24H   Continuous Infusions: . sodium chloride     PRN Meds:.acetaminophen **OR** acetaminophen, albuterol, benzonatate, guaiFENesin-codeine, LORazepam, morphine injection, ondansetron **OR** ondansetron (ZOFRAN) IV Medications Prior  to Admission:  Prior to Admission medications   Medication Sig Start Date End Date Taking? Authorizing Provider  albuterol (PROVENTIL HFA;VENTOLIN HFA) 108 (90 Base) MCG/ACT inhaler Inhale 2 puffs into the lungs every 6 (six) hours as needed for wheezing or shortness of breath.  07/06/15  Yes Historical Provider, MD  amiodarone (PACERONE) 200 MG tablet Take 200 mg by mouth 2 (two) times daily.  08/13/15  Yes Historical Provider, MD  amitriptyline (ELAVIL) 75 MG tablet Take 75 mg by mouth at bedtime.  11/24/15  Yes Historical Provider, MD  amphetamine-dextroamphetamine (ADDERALL) 20 MG tablet Take 1 tablet by mouth every morning. 01/03/16  Yes Historical Provider, MD  buPROPion (WELLBUTRIN XL) 150 MG 24 hr tablet Take 150 mg by mouth at bedtime.  07/19/15  Yes Historical Provider, MD  Diphenhyd-Hydrocort-Nystatin (FIRST-DUKES MOUTHWASH) SUSP Use as directed 5 mLs in the mouth or throat 4 (four) times daily. 12/26/15  Yes Lloyd Huger, MD  HYDROcodone-acetaminophen (NORCO) 10-325 MG tablet Take 2 tablets by mouth 3 (three) times daily as needed for moderate pain or severe pain.  12/02/15  Yes Historical Provider, MD  hydrocortisone (CORTEF) 20 MG tablet Take 40 mg by mouth 3 (three) times daily. 12/26/15  Yes Historical Provider, MD    megestrol (MEGACE) 400 MG/10ML suspension Take 5 mLs (200 mg total) by mouth daily. 01/03/16  Yes Lequita Asal, MD  metoprolol succinate (TOPROL-XL) 25 MG 24 hr tablet Take 25 mg by mouth every morning.  08/15/15  Yes Historical Provider, MD  mirtazapine (REMERON) 15 MG tablet TAKE 1 TABLET BY MOUTH AT BEDTIME 11/24/15  Yes Historical Provider, MD  morphine (MS CONTIN) 15 MG 12 hr tablet Take 15 mg by mouth every 12 (twelve) hours.    Yes Historical Provider, MD  Naloxone HCl (NARCAN NA) Place 1 application into the nose every 10 (ten) minutes as needed. For respiratory depression, unconsciousness, opioid overdose. 12/02/15  Yes Historical Provider, MD  tiotropium (SPIRIVA) 18 MCG inhalation capsule Place 18 mcg into inhaler and inhale every morning.  11/05/14  Yes Historical Provider, MD  docusate sodium (COLACE) 100 MG capsule Take 1 capsule (100 mg total) by mouth 2 (two) times daily. Patient not taking: Reported on 01/07/2016 11/03/15   Hollice Espy, MD  fludrocortisone (FLORINEF) 0.1 MG tablet Take 1 tablet (0.1 mg total) by mouth daily. 11/16/15   Minna Merritts, MD  lidocaine-prilocaine (EMLA) cream Apply to affected area once Patient not taking: Reported on 01/07/2016 11/17/15   Lloyd Huger, MD  nicotine (NICODERM CQ - DOSED IN MG/24 HOURS) 21 mg/24hr patch Place 21 mg onto the skin daily.    Historical Provider, MD   Allergies  Allergen Reactions  . Aspirin Other (See Comments)    Pt gets bleeding ulcers when she takes this medication Bleeding ulcers  . Nsaids Other (See Comments)    Other reaction(s): Other (See Comments) Cannot take due to significant peptic ulcer disease. Cannot take due to significant peptic ulcer disease.   Review of Systems  Constitutional: Positive for appetite change and fatigue.  Respiratory: Positive for cough and shortness of breath.   Neurological: Positive for weakness.    Physical Exam  Constitutional: She is oriented to person, place, and  time. She appears cachectic. She appears ill.  Cardiovascular: Normal rate, regular rhythm and normal heart sounds.   Pulmonary/Chest: She has decreased breath sounds.  -large amount of thick yellow sputum, difficulty clearing 2/2 to weakness  Musculoskeletal:  -generalized weakness  Neurological:  She is alert and oriented to person, place, and time.    Vital Signs: BP (!) 92/45 (BP Location: Right Arm)   Pulse 93   Temp 97.7 F (36.5 C) (Oral)   Resp 18   Ht _0  (1.676 m)   Wt 48.4 kg (106 lb 12.8 oz)   SpO2 99%   BMI 17.24 kg/m  Pain Assessment: 0-10 POSS *See Group Information*: 1-Acceptable,Awake and alert Pain Score: 5    SpO2: SpO2: 99 % O2 Device:SpO2: 99 % O2 Flow Rate: .O2 Flow Rate (L/min): 4 L/min  IO: Intake/output summary:  Intake/Output Summary (Last 24 hours) at 01/09/16 1435 Last data filed at 01/08/16 2230  Gross per 24 hour  Intake               50 ml  Output              200 ml  Net             -150 ml    LBM: Last BM Date: 01/04/16 Baseline Weight: Weight: 45.4 kg (100 lb) Most recent weight: Weight: 48.4 kg (106 lb 12.8 oz)      Palliative Assessment/Data: 30 % at best   Flowsheet Rows   Flowsheet Row Most Recent Value  Intake Tab  Referral Department  Hospitalist  Unit at Time of Referral  Oncology Unit  Palliative Care Primary Diagnosis  Cancer  Date Notified  01/09/16  Palliative Care Type  New Palliative care  Reason for referral  Clarify Goals of Care  Date of Admission  01/07/16  # of days IP prior to Palliative referral  2  Clinical Assessment  Psychosocial & Spiritual Assessment  Palliative Care Outcomes      Discussed with Dr Darvin Neighbours   Time In: 1230 Time Out: 1345 Time Total: 75 min Greater than 50%  of this time was spent counseling and coordinating care related to the above assessment and plan.  Signed by: Evelyn Lessen, NP   Please contact Palliative Medicine Team phone at [phone] for questions and concerns.    For individual provider: See Shea Evans

## 2016-01-09 NOTE — Progress Notes (Addendum)
Initial Nutrition Assessment  DOCUMENTATION CODES:   Underweight  INTERVENTION:   -RD will follow for diet advancement and goals of care and supplement as appropriate -If pt and family desire aggressive care, recommend:  Initiate Jevity 1.2 @ 20 ml/hr and increase by 10 ml every 12 hours to goal rate of 60 ml/hr.   Tube feeding regimen provides 1728 kcal (100% of needs), 80 grams of protein, and 1162 ml of H2O.   -After initiating of nutrition support, recommend checking K, Mg, and Phos daily x 3 days, due to high refeeding risk  NUTRITION DIAGNOSIS:   Inadequate oral intake related to dysphagia, inability to eat as evidenced by NPO status.  GOAL:   Patient will meet greater than or equal to 90% of their needs  MONITOR:   Diet advancement, Labs, Weight trends, Skin, I & O's  REASON FOR ASSESSMENT:   Malnutrition Screening Tool, Consult Assessment of nutrition requirement/status  ASSESSMENT:   60 year old Caucasian female history of Stage 2A lung cancer admitted for pneumonia with sepsis and COPD exacerbation  Pt admitted with penuemonis, sepsis, and COPD exacerbation.   Pt with stage II lung cancer, currently on cycle 4 of Opav[place].   Attempted to examine pt x 3, however, pt in with multiple medical providers during times of visits.   Per chart review, pt's daughter is extremely concerned over poor nutritional intake. Pt was previously on a dysphagia 3 diet with nectar thick liquids, however, pt is now NPO related to decreased respiratory status and fatigue. Pt has had a two week hx of regurgitating food intake, secondary to esophageal stricture.   Reviewed wt hx, which reveals UBW around 125#. Pt has experienced progressive wt loss over the past year, including a 10.2% wt loss over the past 6 months, which is significant for time frame.   Medications reviewed and include megace, magic mouthwash, and remeron. Medications have been transferred to IV form due to NPO status  and dysphagia.   Palliative care consult pending for goals of care.   RD suspects malnutrition, however, unable to confirm at this time. Suspect continued nutritional decline if pt does not pursue aggressive care.   Labs reviewed.   Diet Order:   NPO  Skin:  Reviewed, no issues  Last BM:  01/04/16  Height:   Ht Readings from Last 1 Encounters:  01/07/16 '5\' 6"'$  (1.676 m)    Weight:   Wt Readings from Last 1 Encounters:  01/07/16 106 lb 12.8 oz (48.4 kg)    Ideal Body Weight:  59.1 kg  BMI:  Body mass index is 17.24 kg/m.  Estimated Nutritional Needs:   Kcal:  1500-1700  Protein:  75-90 grams  Fluid:  1.5-1.7 L  EDUCATION NEEDS:   No education needs identified at this time  Jerod Mcquain A. Jimmye Norman, RD, LDN, CDE Pager: [phone] After hours Pager: [phone]

## 2016-01-10 ENCOUNTER — Telehealth: Payer: Self-pay | Admitting: *Deleted

## 2016-01-10 DIAGNOSIS — Z515 Encounter for palliative care: Secondary | ICD-10-CM

## 2016-01-10 DIAGNOSIS — J9601 Acute respiratory failure with hypoxia: Secondary | ICD-10-CM

## 2016-01-10 DIAGNOSIS — Z7189 Other specified counseling: Secondary | ICD-10-CM

## 2016-01-10 LAB — BLOOD GAS, ARTERIAL
ACID-BASE DEFICIT: 1.3 mmol/L (ref 0.0–2.0)
ALLENS TEST (PASS/FAIL): POSITIVE — AB
BICARBONATE: 23.7 meq/L (ref 21.0–28.0)
FIO2: 0.28
O2 Saturation: 84.9 %
PATIENT TEMPERATURE: 37
PH ART: 7.38 (ref 7.350–7.450)
pCO2 arterial: 40 mmHg (ref 32.0–48.0)
pO2, Arterial: 51 mmHg — ABNORMAL LOW (ref 83.0–108.0)

## 2016-01-10 LAB — MRSA PCR SCREENING: MRSA by PCR: NEGATIVE

## 2016-01-10 LAB — VANCOMYCIN, TROUGH: Vancomycin Tr: 43 ug/mL (ref 15–20)

## 2016-01-10 MED ORDER — GLYCOPYRROLATE 0.2 MG/ML IJ SOLN
0.2000 mg | Freq: Four times a day (QID) | INTRAMUSCULAR | Status: DC
  Administered 2016-01-10 – 2016-01-11 (×4): 0.2 mg via INTRAVENOUS
  Filled 2016-01-10 (×4): qty 1

## 2016-01-10 MED ORDER — LORAZEPAM 2 MG/ML IJ SOLN
1.0000 mg | Freq: Once | INTRAMUSCULAR | Status: AC
  Administered 2016-01-10: 22:00:00 1 mg via INTRAVENOUS
  Filled 2016-01-10: qty 1

## 2016-01-10 MED ORDER — PHENOL 1.4 % MT LIQD
1.0000 | OROMUCOSAL | Status: DC | PRN
  Filled 2016-01-10: qty 177

## 2016-01-10 MED ORDER — LORAZEPAM 2 MG/ML IJ SOLN
1.0000 mg | INTRAMUSCULAR | Status: DC | PRN
  Administered 2016-01-10: 16:00:00 1 mg via INTRAVENOUS
  Administered 2016-01-10: 0.5 mg via INTRAVENOUS
  Administered 2016-01-10 – 2016-01-11 (×5): 1 mg via INTRAVENOUS
  Filled 2016-01-10 (×7): qty 1

## 2016-01-10 MED ORDER — MENTHOL 3 MG MT LOZG
1.0000 | LOZENGE | OROMUCOSAL | Status: DC | PRN
  Filled 2016-01-10: qty 9

## 2016-01-10 MED ORDER — SODIUM CHLORIDE 0.9 % IV SOLN
INTRAVENOUS | Status: AC
  Administered 2016-01-11: 05:00:00 via INTRAVENOUS

## 2016-01-10 MED ORDER — HYDROMORPHONE HCL 1 MG/ML IJ SOLN
1.0000 mg | Freq: Once | INTRAMUSCULAR | Status: AC
  Administered 2016-01-10: 22:00:00 1 mg via INTRAVENOUS
  Filled 2016-01-10 (×2): qty 1

## 2016-01-10 MED ORDER — MORPHINE SULFATE (PF) 2 MG/ML IV SOLN
2.0000 mg | INTRAVENOUS | Status: DC | PRN
  Administered 2016-01-10 – 2016-01-11 (×6): 2 mg via INTRAVENOUS
  Filled 2016-01-10 (×6): qty 1

## 2016-01-10 MED ORDER — MORPHINE SULFATE (PF) 2 MG/ML IV SOLN
1.0000 mg | INTRAVENOUS | Status: DC | PRN
  Administered 2016-01-10 (×2): 1 mg via INTRAVENOUS
  Filled 2016-01-10 (×2): qty 1

## 2016-01-10 NOTE — Progress Notes (Addendum)
[place] [facility] [place] NAME: Evelyn Willis    MR#:  [id number]  DATE OF BIRTH:  05/15/1956  SUBJECTIVE:   Still SOB and Cough, weakness. On  NPO. On O2 [place] 4 L.  REVIEW OF SYSTEMS:    Review of Systems  Constitutional: Positive for malaise/fatigue and weight loss. Negative for chills and fever.  HENT: Negative.  Negative for ear discharge, ear pain, hearing loss, nosebleeds and sore throat.   Eyes: Negative.  Negative for blurred vision and pain.  Respiratory: Positive for cough and shortness of breath. Negative for hemoptysis and wheezing.   Cardiovascular: Negative.  Negative for chest pain, palpitations and leg swelling.  Gastrointestinal: Positive for nausea. Negative for abdominal pain, blood in stool, diarrhea and vomiting.  Genitourinary: Negative.  Negative for dysuria.  Musculoskeletal: Negative.  Negative for back pain.  Skin: Negative.   Neurological: Positive for weakness. Negative for dizziness, tremors, speech change, focal weakness, seizures and headaches.  Endo/Heme/Allergies: Negative.  Does not bruise/bleed easily.  Psychiatric/Behavioral: Negative.  Negative for depression, hallucinations and suicidal ideas.    DRUG ALLERGIES:   Allergies  Allergen Reactions  . Aspirin Other (See Comments)    Pt gets bleeding ulcers when she takes this medication Bleeding ulcers  . Nsaids Other (See Comments)    Other reaction(s): Other (See Comments) Cannot take due to significant peptic ulcer disease. Cannot take due to significant peptic ulcer disease.    VITALS:  Blood pressure 118/70, pulse (!) 103, temperature 98 F (36.7 C), temperature source Oral, resp. rate 18, height '5\' 6"'$  (1.676 m), weight 106 lb 12.8 oz (48.4 kg), SpO2 98 %.  PHYSICAL EXAMINATION:   Physical Exam  Constitutional: She is oriented to person, place, and time. She appears distressed.  frail  HENT:  Head: Normocephalic.  Eyes: No scleral icterus.   Neck: Normal range of motion. No JVD present. No tracheal deviation present.  Cardiovascular: Regular rhythm.  Exam reveals no gallop and no friction rub.   No murmur heard. tachycardia  Pulmonary/Chest: Effort normal. No respiratory distress. She has no wheezes. She has no rales. She exhibits no tenderness.  Increased work of  Breathing Decreased with crackles at bases  Abdominal: Soft. Bowel sounds are normal. She exhibits no distension and no mass. There is no tenderness. There is no rebound and no guarding.  Musculoskeletal: Normal range of motion. She exhibits no edema.  Lymphadenopathy:    She has no cervical adenopathy.  Neurological: She is alert and oriented to person, place, and time.  Skin: Skin is warm. No rash noted. No erythema.  Psychiatric: Affect and judgment normal.      LABORATORY PANEL:   CBC  Recent Labs Lab 01/09/16 0424  WBC 22.2*  HGB 8.7*  HCT 27.5*  PLT 546*   ------------------------------------------------------------------------------------------------------------------  Chemistries   Recent Labs Lab 01/07/16 1743  01/09/16 0424  NA 139  < > 140  K 4.0  < > 4.1  CL 104  < > 110  CO2 24  < > 24  GLUCOSE 105*  < > 128*  BUN 17  < > 14  CREATININE 0.59  < > 0.48  CALCIUM 9.1  < > 8.7*  MG 1.9  --   --   AST 24  --   --   ALT 27  --   --   ALKPHOS 103  --   --   BILITOT 0.6  --   --   < > =  values in this interval not displayed. ------------------------------------------------------------------------------------------------------------------  Cardiac Enzymes  Recent Labs Lab 01/07/16 1743  TROPONINI <0.03   ------------------------------------------------------------------------------------------------------------------  RADIOLOGY:  No results found. 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  * Sepsis Bilateral aspiration pneumonia/HCAP Continue ZOSYN and vancomycin.  * Acute hypoxic resp failure with HCAP. Continue O2 [place], NEB prn. MRSA PCR: negative.  IV steroids  * Squamous cell carcinoma of left lung Stage 2A no longer options for surgical and radiation treatment per Dr. Grayland Ormond.  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 NPO for PEG placement.  * History of MS: Continue pain management.  * History of atrial fibrillation: Continue amiodarone   * Anemia: Continue to monitor hemoglobin. Patient does have chronic anemia.   Management plans discussed with the patient and family and they are in agreement.  CODE STATUS: DNR.  Discussed with Dr. Stevenson Clinch and palliative care NP [facility].   TOTAL TIME TAKING CARE OF THIS PATIENT: 43 minutes.  High risk of intubation.   Demetrios Loll M.D on 01/10/2016 at 1:33 PM  Between 7am to 6pm - Pager - [phone]  After 6pm go to [email or web address] - password [email or web address] [facility]  Office  [phone]  CC: Primary care physician; BABAOFF, Caryl Bis, MD  Note: This dictation was prepared with Dragon dictation along with smaller phrase technology. Any transcriptional errors that result from this process are unintentional.

## 2016-01-10 NOTE — Progress Notes (Signed)
Pharmacy Antibiotic Note  Evelyn Willis is a 60 y.o. female admitted on 01/07/2016 with sepsis.  Pharmacy has been consulted for Zosyn dosing. Vancomycin discontinued secondary to negative PCR screen.   Plan: Zosyn EI 3.375g IV Q8hr.     Height: '5\' 6"'$  (167.6 cm) Weight: 106 lb 12.8 oz (48.4 kg) IBW/kg (Calculated) : 59.3  Temp (24hrs), Avg:98.1 F (36.7 C), Min:97.5 F (36.4 C), Max:99 F (37.2 C)   Recent Labs Lab 01/07/16 1743 01/07/16 1847 01/07/16 2145 01/08/16 0356 01/09/16 0424 01/10/16 0630  WBC 21.7*  --   --  16.4* 22.2*  --   CREATININE 0.59  --   --  0.45 0.48  --   LATICACIDVEN  --  1.3 2.1* 1.1  --   --   VANCOTROUGH  --   --   --   --   --  43*    Estimated Creatinine Clearance: 56.4 mL/min (by C-G formula based on SCr of 0.8 mg/dL).     Antimicrobials this admission: vancomycin 8/12 >> 8/15 cefepime 8/12 >> 8/14 Zosyn 8/14 >>    Dose adjustments this admission:  Microbiology results: 8/12 BCx: No growth x 3 days  8/14 MRSA PCR: negative   Pharmacy will continue to monitor and adjust per consult.    Simpson,Michael L 01/10/2016 10:47 AM

## 2016-01-10 NOTE — Consult Note (Signed)
Patient with deep frequent cough during visit.  Pt weak, lethargic.  She is unable to swallow anything at this time due to obstruction of esophagus from lung cancer.  Recent CXR have shown bilat pneumonia and COPD.  I talked with family and anesthesiologist felt her chances of even getting through a PEG was likely 50/50 at best.  Family considering home with Hospice care.

## 2016-01-10 NOTE — Telephone Encounter (Signed)
-----   Message from Vilinda Boehringer, MD sent at 01/09/2016 11:16 AM EDT ----- Regarding: HFU with Dr. Juanell Fairly Please schedule HFU with Dr. Juanell Fairly, 35mn visit if possible.   Thanks VM

## 2016-01-10 NOTE — Progress Notes (Signed)
* [facility]     Assessment and Plan:  A:60 year old Caucasian female history of Stage 2A lung cancer admitted for pneumonia with sepsis and COPD exacerbation  Pneumonia (post obstructive) and likely some degree of aspiration.  Lung Ca - Hilar Mass on chemotherapy, Squamous cell Ca SOB Cough Wheezing Severe Reflux - CT chest 01/07/16 images reviewed, hilar mass is larger, could be causing some compression of the LLL bronchus due external compression.     P: - cont with PNA treatment - cont with steroids -Would expect recurrence of aspiration pneumonia and continued downward course given advancing lung ca.  -consider palliative care discussions to establish more realistic goals of care.   Advanced care planning note: Discussed with the patient and multiple family members, the patient currently has a left hilar mass with left lung compression and left lung atelectasis. This appears to be due to a advancing left hilar mass, it is unlikely that this mass can be cured. Given her multiple other comorbidities, she is unlikely to tolerate any curative intent therapy. In addition, she also has aspiration pneumonia due to dysphagia, PEG tube has been recommended. Should she desire continued aggressive therapy. I explained to the patient and the family that even with a PEG tube, she will likely continue to develop recurrent episodes of aspiration pneumonia, but will require recurrent readmissions. Either of these episodes, or advancing cancer or other intercurrent problems will likely cause her death. Therefore, I recommended that she consider the option of hospice where she could be discharged and allowed to pass with some degree of comfort. Both her and her family appeared receptive to this idea, this was discussed with palliative care. The patient and family are going to consider discharge to hospice. Palliative care is going to follow up Follow-up to help make these  arrangements. Approximate 30 minutes spent in light of care discussions.    Date: 01/10/2016  MRN# [id number] ARAIYA TILMON 06/19/1955   JARIA CONWAY is a 60 y.o. old female seen in follow up for chief complaint of  Chief Complaint  Patient presents with  . Shortness of Breath     HPI:   The patient has no new complaints today, she continues to have dyspnea, cough.  Medication:   Reviewed.  Allergies:  Aspirin and Nsaids  Review of Systems: Gen:  Denies  fever, sweats. HEENT: Denies blurred vision. Cvc:  No dizziness, chest pain or heaviness Skin: No skin rash, easy bruising. Endoc:  No polyuria, polydipsia. Psych: No depression, insomnia. Other:  All other systems were reviewed and found to be negative other than what is mentioned in the HPI.   Physical Examination:   VS: BP 118/70 (BP Location: Right Arm)   Pulse (!) 103   Temp 98 F (36.7 C) (Oral)   Resp 18   Ht '5\' 6"'$  (1.676 m)   Wt 106 lb 12.8 oz (48.4 kg)   SpO2 98%   BMI 17.24 kg/m   General Appearance: No distress, appears very tired, fatigued, tearful.  Neuro:without focal findings,  speech normal,  HEENT: PERRLA, EOM intact. Pulmonary: decreased air entry in LLL.   CardiovascularNormal S1,S2.  No m/r/g.   Abdomen: Benign, Soft, non-tender. Renal:  No costovertebral tenderness  GU:  Not performed at this time. Endoc: No evident thyromegaly, no signs of acromegaly. Skin:   warm, no rash. Extremities: normal, no cyanosis, clubbing.   LABORATORY PANEL:   CBC  Recent Labs Lab 01/09/16 0424  WBC 22.2*  HGB 8.7*  HCT 27.5*  PLT 546*   ------------------------------------------------------------------------------------------------------------------  Chemistries   Recent Labs Lab 01/07/16 1743  01/09/16 0424  NA 139  < > 140  K 4.0  < > 4.1  CL 104  < > 110  CO2 24  < > 24  GLUCOSE 105*  < > 128*  BUN 17  < > 14  CREATININE 0.59  < > 0.48  CALCIUM 9.1  < > 8.7*  MG 1.9  --   --    AST 24  --   --   ALT 27  --   --   ALKPHOS 103  --   --   BILITOT 0.6  --   --   < > = values in this interval not displayed. ------------------------------------------------------------------------------------------------------------------  Cardiac Enzymes  Recent Labs Lab 01/07/16 1743  TROPONINI <0.03   ------------------------------------------------------------  RADIOLOGY:   No results found for this or any previous visit. Results for orders placed during the hospital encounter of 01/07/16  DG Chest 2 View   Narrative CLINICAL DATA:  Acute shortness of breath with cough. History of lung carcinoma.  EXAM: CHEST  2 VIEW  COMPARISON:  10/24/2015  FINDINGS: There is no longer complete opacification of left hemi thorax. There is left perihilar and lower lung zone coarse reticular opacity consistent with treatment related scarring. Superimposed infection is possible.  Right lung is hyperexpanded. There are irregularly thickened interstitial markings in a most notable the right lung base, similar to the prior study. There is some hazy airspace opacity in the right medial lung base projecting in the right middle lobe on the lateral view.  Minimal left pleural effusion.  No right pleural effusion.  No pneumothorax.  Cardiac silhouette is normal in size.  No mediastinal masses.  IMPRESSION: 1. Hazy opacity the right medial lung base suggests superimposed infection on chronic interstitial thickening. 2. Opacity in the left perihilar and lower lung zones likely treatment related scarring. Superimposed pneumonia is possible. 3. Minimal left pleural effusion. 4. No pulmonary edema.   Electronically Signed   By: Lajean Manes M.D.   On: 01/07/2016 18:08    ------------------------------------------------------------------------------------------------------------------  Thank  you for allowing [facility] to assist in the care of your  patient. Our recommendations are noted above.  Please contact us if we can be of further service.   Marda Stalker, MD.  [facility] Office Number: [phone]  Patricia Pesa, M.D.  Vilinda Boehringer, M.D.  Merton Border, M.D  01/10/2016

## 2016-01-10 NOTE — Telephone Encounter (Signed)
Spoke with Ginger on the unit and gave hosp f/u appt for 01/27/16 @ 11:45am with DR. Nothing further needed.

## 2016-01-10 NOTE — Progress Notes (Signed)
Patients daughter refuses bed alarm, patient and daughter educated.

## 2016-01-10 NOTE — Progress Notes (Signed)
New hospice home referral received from [facility]. Writer introduced herself to patient;s daughter Gwinda Passe and made a plan to meet to discuss services in the morning. Thank you. Flo Shanks RN, [facility], hospital liaison [phone]

## 2016-01-10 NOTE — Progress Notes (Signed)
Clinical Education officer, museum (CSW) received residential hospice consult and that family prefers [facility]. CSW notified [facility] liaison. CSW will continue to follow and assist as needed.   [employer], LCSW [phone]

## 2016-01-10 NOTE — Progress Notes (Signed)
Speech Therapy Note: reviewed chart notes and consulted Palliative Care and NSG re: pt's poc at this time. A consult w/ GI today for a PEG placement is in the chart. Pt is NPO but daughter frequently requests something to eat and drink for her mother. Discussed w/ NSG and Palliative Care that only ice chips for pleasure(w/ aspiration precautions) would be recommended d/t the Esophageal phase dysphagia and high risk for aspiration currently.  ST services will f/u w/ pt's status tomorrow for further need for assessment and education as indicated. NSG and Palliative Care agreed.

## 2016-01-10 NOTE — Progress Notes (Signed)
Patient and patient's daughter educated on multiple occasions regarding patient's current NPO status amidst multiple demands for water for the patient. Mouth swabs given to moisten mouth. Daughter states that the patient is used to a high amount of pain medication from the pain center, educated patient that it is important to not give the patient too much due to her respiratory status and her blood pressure that trends low. Therapeutic presence given, continue to assess.

## 2016-01-10 NOTE — Progress Notes (Signed)
Daily Progress Note   Patient Name: EATHER CHAIRES       Date: 01/10/2016 DOB: April 16, 1956  Age: 60 y.o. MRN#: [id number] Attending Physician: Demetrios Loll, MD Primary Care Physician: Marcello Fennel, MD Admit Date: 01/07/2016  Reason for Consultation/Follow-up: Establishing goals of care, Non pain symptom management, Pain control and Psychosocial/spiritual support  Subjective: -continued conversation regarding diagnosis, prognosis, GOC, EOL wishes disposition and options   -many conversations today with various family members and patient --final decision is shift to full comfort  --no feeding tube, focus is full comfort, liberalize medications to enhance comfort, sips as tolerated (known risk for aspiration, swish and spit  Length of Stay: 3  Current Medications: Scheduled Meds:  . amiodarone  200 mg Oral Daily  . amitriptyline  75 mg Oral QHS  . budesonide (PULMICORT) nebulizer solution  0.5 mg Nebulization BID  . buPROPion  150 mg Oral Daily  . enoxaparin (LOVENOX) injection  40 mg Subcutaneous Q24H  . fludrocortisone  0.1 mg Oral Daily  . ipratropium-albuterol  3 mL Nebulization Q4H  . magic mouthwash  5 mL Oral QID  . megestrol  200 mg Oral Daily  . methylPREDNISolone (SOLU-MEDROL) injection  40 mg Intravenous Q12H  . nicotine  21 mg Transdermal Daily  . piperacillin-tazobactam (ZOSYN)  IV  3.375 g Intravenous Q8H  . tiotropium  18 mcg Inhalation Daily    Continuous Infusions: . sodium chloride 50 mL/hr at 01/10/16 0824    PRN Meds: acetaminophen **OR** acetaminophen, albuterol, benzonatate, bisacodyl, guaiFENesin-codeine, LORazepam, menthol-cetylpyridinium, morphine injection, ondansetron **OR** ondansetron (ZOFRAN) IV  Physical Exam  Constitutional: She appears  cachectic. She appears ill. Nasal cannula in place.  HENT:  Mouth/Throat: Oropharynx is clear and moist.  Audible throat secretions  Cardiovascular: Tachycardia present.   Pulmonary/Chest: Tachypnea noted.  Musculoskeletal:  generalized weakness  Skin: Skin is warm and dry.            Vital Signs: BP 119/62   Pulse (!) 113   Temp 99 F (37.2 C) (Oral)   Resp 20   Ht '5\' 6"'$  (1.676 m)   Wt 48.4 kg (106 lb 12.8 oz)   SpO2 95%   BMI 17.24 kg/m  SpO2: SpO2: 95 % O2 Device: O2 Device: Nasal Cannula O2 Flow Rate: O2 Flow Rate (L/min): 4 L/min  Intake/output summary:  Intake/Output Summary (Last 24 hours) at 01/10/16 1022 Last data filed at 01/10/16 0441  Gross per 24 hour  Intake          1058.17 ml  Output              650 ml  Net           408.17 ml   LBM: Last BM Date: 01/04/16 Baseline Weight: Weight: 45.4 kg (100 lb) Most recent weight: Weight: 48.4 kg (106 lb 12.8 oz)       Palliative Assessment/Data: 30 %    Flowsheet Rows   Flowsheet Row Most Recent Value  Intake Tab  Referral Department  Hospitalist  Unit at Time of Referral  Oncology Unit  Palliative Care Primary Diagnosis  Cancer  Date Notified  01/09/16  Palliative Care Type  New Palliative care  Reason for referral  Clarify Goals of Care  Date of Admission  01/07/16  # of days IP prior to Palliative referral  2  Clinical Assessment  Psychosocial & Spiritual Assessment  Palliative Care Outcomes      Patient Active Problem List   Diagnosis Date Noted  . DNR (do not resuscitate) discussion   . Palliative care encounter   . Malignant neoplasm of left lung ([place])   . Severe sepsis ([place])   . COPD exacerbation ([place])   . Acute on chronic respiratory failure with hypoxia ([place]) 01/07/2016  . Anemia 01/03/2016  . Peptic ulcer disease 12/20/2015  . Tobacco abuse 12/20/2015  . Trouble swallowing 12/15/2015  . Swallowing difficulty 12/15/2015  . Collapse of left lung 10/23/2015  . CAP (community acquired  pneumonia) 10/23/2015  . Gross hematuria 09/23/2015  . CAD (coronary atherosclerotic disease) 08/15/2015  . Hematuria 08/15/2015  . Malignant neoplasm of hilus of left lung ([place])   . Depression   . Relapsing remitting multiple sclerosis ([place])   . Encounter for anticoagulation discussion and counseling   . Smoker   . Centrilobular emphysema ([place])   . COPD, mild ([place]) 07/19/2015  . Pain medication agreement signed 05/31/2015  . Chronic fatigue 04/27/2014  . Insomnia, unspecified 04/27/2014  . Arthralgia 04/29/2013  . Chronic pain syndrome 04/29/2013  . Encounter for long-term (current) use of other medications 04/29/2013  . Chronic pain 09/12/2012    Palliative Care Assessment & Plan   Assessment  60 y.o. 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  Reported several days  of of worsening cough and increased shortness of breath. She states that she is worn out. Coughing causes nausea and vomiting. She denies aspiration. She has had a fever of 100.x and intermittent chills. Sputum is thick with some blood. She has been unable to eat x 2 days.   In  the emergency room initial O2 sats were in the mid 70s which improved with oxygen. CXR revealed hazy opacity in the right medial lung base suggests superimposed infection on chronic interstitial thickening.   Chest CT 01-07-16  IMPRESSION:  LEFT perihilar tumor extending into LEFT hilum, appearing slightly larger than on the previous study.  New BILATERAL pulmonary infiltrates compatible with infection/pneumonia.  Volume loss and subsegmental atelectasis in LEFT lower lobe with small LEFT pleural effusion.  Significant dysphagia    Recommendations/Plan:   Shift to comfort and disposition to hospice facility, family understands focus of care will be comfort  Symptom management: Morphine/Ativan/Robinol  -  Code  Status:  Code Status Orders        Start     Ordered   01/10/16 1022  Do not attempt resuscitation (DNR)  Continuous    Question Answer Comment  In the event of cardiac or respiratory ARREST Do not call a "code blue"   In the event of cardiac or respiratory ARREST Do not perform Intubation, CPR, defibrillation or ACLS   In the event of cardiac or respiratory ARREST Use medication by any route, position, wound care, and other measures to relive pain and suffering. May use oxygen, suction and manual treatment of airway obstruction as needed for comfort.      01/10/16 1022    Code Status History    Date Active Date Inactive Code Status Order ID Comments User Context   01/08/2016 11:13 AM 01/10/2016 10:22 AM Full Code [id number]  Bettey Costa, MD Inpatient   01/08/2016 10:26 AM 01/08/2016 11:13 AM DNR [id number]  Bettey Costa, MD Inpatient   01/07/2016  6:43 PM 01/08/2016 10:26 AM Full Code [id number]  Lytle Butte, MD ED   10/23/2015  3:19 AM 10/25/2015  2:02 PM Full Code [id number]  Saundra Shelling, MD Inpatient   08/06/2015 11:33 AM 08/07/2015  8:44 PM Full Code [id number]  Vaughan Basta, MD Inpatient       Prognosis:   < 2 weeks  Discharge Planning:  Hospice facility  Care plan was discussed with Dr Bridgett Larsson  Thank you for allowing the Palliative Medicine Team to assist in the care of this patient.   Time In: 1630 Time Out: 1730 Total Time 60 min Prolonged Time Billed  no       Greater than 50%  of this time was spent counseling and coordinating care related to the above assessment and plan.  Wadie Lessen, NP  Please contact Palliative Medicine Team phone at [phone] for questions and concerns.

## 2016-01-10 NOTE — Progress Notes (Addendum)
[facility]   PATIENT NAME: Evelyn Willis    MR#:  [id number]  DATE OF BIRTH:  [birth year]   DATE OF ADMISSION:  01/07/2016   REQUESTING/REFERRING PHYSICIAN: Mody CHIEF COMPLAINT:   Chief Complaint  Patient presents with  . Shortness of Breath  Increased WOB and cough  HISTORY OF PRESENT ILLNESS:  Evelyn Willis  is a 60 y.o. female with a known history of Lung cancer Stage 2A,  - on active chemo treatment presenting with shortness of breath and cough.  -She states 2 day duration of continuing cough with associated posttussive emesis.  -Cough productive green sputum denies fevers chills positive fatigue and weakness. -code sepsis was started in ER -PFT from 11/04/15, FEV1 51%, air trapping, DLCO equals 51%, consistent with moderate COPD, on the cusp of becoming severe.  PAST MEDICAL HISTORY:   Past Medical History:  Diagnosis Date  . Atrial fibrillation ([place])   . Cancer [facility]) 2016   Left lung cancer, radiation, chemo  . COPD (chronic obstructive pulmonary disease) ([place])   . Coronary artery disease    said- stent was placed 10 yrs ago from now( 2017) in [place]  . Depression   . Emphysema lung ([place])   . Hematuria   . Multiple sclerosis ([place])   . Renal calculi    SUBJECTIVE: Pt currently on 4L O2 via nasal canula no acute distress.  She states she still has a frequent productive cough producing green tinged sputum. She states she is exhausted   REVIEW OF SYSTEMS:  REVIEW OF SYSTEMS: Positives in BOLD CONSTITUTIONAL: Denies fevers, chills, Positive fatigue, weakness. Positive weight loss EYES: Denies blurred vision, double vision, or eye pain.  EARS, NOSE, THROAT: Denies tinnitus, ear pain, hearing loss.  RESPIRATORY: Positive cough, +shortness of breath, +wheezing  CARDIOVASCULAR: Denies chest pain, palpitations, edema.  GASTROINTESTINAL: Denies nausea, vomiting, diarrhea, abdominal pain.  GENITOURINARY: Denies dysuria, hematuria.  ENDOCRINE:  Denies nocturia or thyroid problems. HEMATOLOGIC AND LYMPHATIC: Denies easy bruising or bleeding.  SKIN: Denies rash or lesions.  MUSCULOSKELETAL: Denies pain in neck, back, shoulder, knees, hips, or further arthritic symptoms.  NEUROLOGIC: Denies paralysis, paresthesias.  PSYCHIATRIC: Denies anxiety or depressive symptoms.  VITAL SIGNS:  Blood pressure 118/70, pulse (!) 103, temperature 98 F (36.7 C), temperature source Oral, resp. rate 18, height '5\' 6"'$  (1.676 m), weight 48.4 kg (106 lb 12.8 oz), SpO2 98 %.   GENERAL: chronically ill frail appearing female  NEURO: alert and oriented, follows commands HEAD: Normocephalic, atraumatic.  EAR, NOSE, THROAT: Clear without exudates. No external lesions.  NECK: Supple, No JVD PULMONARY: rhonchi throughout,  even non labored CARDIOVASCULAR: s1s2, rrr, no M/R/G, no edema, pedal pulses 2+ bilaterally.  GASTROINTESTINAL: hypoactive BSx4, Soft, nontender, non distended, no masses MUSCULOSKELETAL: frail appearing, full ROM SKIN: No ulceration, lesions, rashes, or cyanosis. Skin warm and dry. Turgor intact.   IMPRESSION AND PLAN:   60 year old Caucasian female history of Stage 2A lung cancer admitted for pneumonia with sepsis and COPD exacerbation  1. Supplemental O2 as needed to maintain O2 sats 92% to 94% 2. Continue IV abx as prescribed 3. Continue proventil, pulmicort, and spiriva 4. Continue IV solumedrol as prescribed 5. History of Squamous cell carcinoma of left lung Stage 2A: Continue pain     medication, Oncology Consulted appreciate input 6. Agree with Palliative Care Consult to discuss goals of treatment 7. Intermittent CXR 8. No indication for bronch at this time   Marda Stalker, [place] Pager [phone] (please  enter 7 digits) PCCM Consult Pager [phone] (please enter 7 digits)  Pls see my note.  Marda Stalker, M.D.

## 2016-01-11 DIAGNOSIS — R0600 Dyspnea, unspecified: Secondary | ICD-10-CM

## 2016-01-11 MED ORDER — PHENOL 1.4 % MT LIQD: 1.0000 | OROMUCOSAL | 0 refills | Status: AC | PRN

## 2016-01-11 MED ORDER — MENTHOL 3 MG MT LOZG: 1.0000 | LOZENGE | OROMUCOSAL | 12 refills | Status: AC | PRN

## 2016-01-11 MED ORDER — IPRATROPIUM-ALBUTEROL 0.5-2.5 (3) MG/3ML IN SOLN: 3.0000 mL | RESPIRATORY_TRACT | 0 refills | Status: AC

## 2016-01-11 MED ORDER — MORPHINE SULFATE (PF) 2 MG/ML IV SOLN
2.0000 mg | INTRAVENOUS | Status: DC | PRN
  Administered 2016-01-11 (×2): 2 mg via INTRAVENOUS
  Filled 2016-01-11 (×2): qty 1

## 2016-01-11 MED ORDER — ONDANSETRON HCL 4 MG/2ML IJ SOLN: 4.0000 mg | Freq: Four times a day (QID) | INTRAMUSCULAR | 0 refills | Status: AC | PRN

## 2016-01-11 MED ORDER — MORPHINE SULFATE (PF) 2 MG/ML IV SOLN: 2.0000 mg | INTRAVENOUS | 0 refills | Status: AC | PRN

## 2016-01-11 MED ORDER — HYDROMORPHONE HCL 1 MG/ML IJ SOLN
1.0000 mg | Freq: Once | INTRAMUSCULAR | Status: AC
  Administered 2016-01-11: 10:00:00 1 mg via INTRAVENOUS
  Filled 2016-01-11: qty 1

## 2016-01-11 MED ORDER — BISACODYL 10 MG RE SUPP: 10.0000 mg | Freq: Every day | RECTAL | 0 refills | Status: AC | PRN

## 2016-01-11 MED ORDER — LORAZEPAM 2 MG/ML IJ SOLN: 1.0000 mg | INTRAMUSCULAR | 0 refills | Status: AC | PRN

## 2016-01-11 MED ORDER — GLYCOPYRROLATE 0.2 MG/ML IJ SOLN: 0.2000 mg | Freq: Four times a day (QID) | INTRAMUSCULAR | Status: AC

## 2016-01-11 NOTE — Progress Notes (Signed)
Speech Therapy Note: reviewed chart notes; consulted NSG and Palliative Care this morning. Pt and family have chosen Hospice services at this time, and pt is d/t discharge today to the Hospice Home. ST services will be available for any further education while pt is admitted. Discussed w/ NSG the option of ice chips or popcicle for pleasure post oral care. NSG will f/u.

## 2016-01-11 NOTE — Progress Notes (Signed)
Daily Progress Note   Patient Name: Evelyn Willis       Date: 01/11/2016 DOB: [birth year]  Age: 60 y.o. MRN#: [id number] Attending Physician: Demetrios Loll, MD Primary Care Physician: Marcello Fennel, MD Admit Date: 01/07/2016  Reason for Consultation/Follow-up: Establishing goals of care, Non pain symptom management, Pain control and Psychosocial/spiritual support  Subjective:  -continued conversation regarding diagnosis, prognosis, GOC, EOL wishes disposition and options  -patient and family comfortable with decision to  shift to full comfort and transition to hospice facility today  -continued conversation regarding natural trajectory and expectations at EOL, questions and concerns addressed  Focus of care is comfort  Length of Stay: 4  Current Medications: Scheduled Meds:  . budesonide (PULMICORT) nebulizer solution  0.5 mg Nebulization BID  . glycopyrrolate  0.2 mg Intravenous QID  .  HYDROmorphone (DILAUDID) injection  1 mg Intravenous Once  . ipratropium-albuterol  3 mL Nebulization Q4H  . magic mouthwash  5 mL Oral QID  . nicotine  21 mg Transdermal Daily  . piperacillin-tazobactam (ZOSYN)  IV  3.375 g Intravenous Q8H    Continuous Infusions: . sodium chloride 50 mL/hr at 01/11/16 0514    PRN Meds: acetaminophen **OR** acetaminophen, albuterol, bisacodyl, LORazepam, menthol-cetylpyridinium, morphine injection, ondansetron **OR** ondansetron (ZOFRAN) IV, phenol  Physical Exam  Constitutional: She appears cachectic. She appears ill. Nasal cannula in place.  HENT:  Mouth/Throat: Oropharynx is clear and moist.  Audible throat secretions  Cardiovascular: Tachycardia present.   Pulmonary/Chest: Tachypnea noted.  Musculoskeletal:  generalized weakness  Skin: Skin is warm  and dry.            Vital Signs: BP 129/79 (BP Location: Left Arm)   Pulse (!) 112   Temp 98.4 F (36.9 C) (Oral)   Resp 18   Ht '5\' 6"'$  (1.676 m)   Wt 48.4 kg (106 lb 12.8 oz)   SpO2 98%   BMI 17.24 kg/m  SpO2: SpO2: 98 % O2 Device: O2 Device: Nasal Cannula O2 Flow Rate: O2 Flow Rate (L/min): 5 L/min  Intake/output summary:   Intake/Output Summary (Last 24 hours) at 01/11/16 0942 Last data filed at 01/11/16 0555  Gross per 24 hour  Intake             1542 ml  Output  350 ml  Net             1192 ml   LBM: Last BM Date: 01/04/16 Baseline Weight: Weight: 45.4 kg (100 lb) Most recent weight: Weight: 48.4 kg (106 lb 12.8 oz)       Palliative Assessment/Data: 30 %    Flowsheet Rows   Flowsheet Row Most Recent Value  Intake Tab  Referral Department  Hospitalist  Unit at Time of Referral  Oncology Unit  Palliative Care Primary Diagnosis  Cancer  Date Notified  01/09/16  Palliative Care Type  New Palliative care  Reason for referral  Clarify Goals of Care  Date of Admission  01/07/16  # of days IP prior to Palliative referral  2  Clinical Assessment  Psychosocial & Spiritual Assessment  Palliative Care Outcomes      Patient Active Problem List   Diagnosis Date Noted  . DNR (do not resuscitate) discussion   . Palliative care encounter   . Malignant neoplasm of left lung ([place])   . Severe sepsis ([place])   . COPD exacerbation ([place])   . Acute on chronic respiratory failure with hypoxia ([place]) 01/07/2016  . Anemia 01/03/2016  . Peptic ulcer disease 12/20/2015  . Tobacco abuse 12/20/2015  . Trouble swallowing 12/15/2015  . Swallowing difficulty 12/15/2015  . Collapse of left lung 10/23/2015  . CAP (community acquired pneumonia) 10/23/2015  . Gross hematuria 09/23/2015  . CAD (coronary atherosclerotic disease) 08/15/2015  . Hematuria 08/15/2015  . Malignant neoplasm of hilus of left lung ([place])   . Depression   . Relapsing remitting multiple sclerosis  ([place])   . Encounter for anticoagulation discussion and counseling   . Smoker   . Centrilobular emphysema ([place])   . COPD, mild ([place]) 07/19/2015  . Pain medication agreement signed 05/31/2015  . Chronic fatigue 04/27/2014  . Insomnia, unspecified 04/27/2014  . Arthralgia 04/29/2013  . Chronic pain syndrome 04/29/2013  . Encounter for long-term (current) use of other medications 04/29/2013  . Chronic pain 09/12/2012    Palliative Care Assessment & Plan   Assessment  60 y.o. 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  Reported several days  of of worsening cough and increased shortness of breath. She states that she is worn out. Coughing causes nausea and vomiting. She denies aspiration. She has had a fever of 100.x and intermittent chills. Sputum is thick with some blood. She has been unable to eat x 2 days.   In  the emergency room initial O2 sats were in the mid 70s which improved with oxygen. CXR revealed hazy opacity in the right medial lung base suggests superimposed infection on chronic interstitial thickening.   Chest CT 01-07-16  IMPRESSION:  LEFT perihilar tumor extending into LEFT hilum, appearing slightly larger than on the previous study.  New BILATERAL pulmonary infiltrates compatible with infection/pneumonia.  Volume loss and subsegmental atelectasis in LEFT lower lobe with small LEFT pleural effusion.  Significant dysphagia    Recommendations/Plan:   Shift to comfort and disposition to hospice facility, family understands focus of care will be comfort  Symptom management: Morphine/Ativan/Robinol  -  Code Status:    Code Status Orders        Start     Ordered   01/10/16 1022  Do not attempt resuscitation (DNR)  Continuous    Question Answer Comment  In the event of cardiac or respiratory ARREST Do not call a "  code blue"   In the event of  cardiac or respiratory ARREST Do not perform Intubation, CPR, defibrillation or ACLS   In the event of cardiac or respiratory ARREST Use medication by any route, position, wound care, and other measures to relive pain and suffering. May use oxygen, suction and manual treatment of airway obstruction as needed for comfort.      01/10/16 1022    Code Status History    Date Active Date Inactive Code Status Order ID Comments User Context   01/08/2016 11:13 AM 01/10/2016 10:22 AM Full Code [id number]  Bettey Costa, MD Inpatient   01/08/2016 10:26 AM 01/08/2016 11:13 AM DNR [id number]  Bettey Costa, MD Inpatient   01/07/2016  6:43 PM 01/08/2016 10:26 AM Full Code [id number]  Lytle Butte, MD ED   10/23/2015  3:19 AM 10/25/2015  2:02 PM Full Code [id number]  Saundra Shelling, MD Inpatient   08/06/2015 11:33 AM 08/07/2015  8:44 PM Full Code [id number]  Vaughan Basta, MD Inpatient       Prognosis:   < 2 weeks  Discharge Planning:  Hospice facility  Care plan was discussed with Dr Bridgett Larsson  Thank you for allowing the Palliative Medicine Team to assist in the care of this patient.   Time In: 0810 Time Out: 0845 Total Time  35 min Prolonged Time Billed  no       Greater than 50%  of this time was spent counseling and coordinating care related to the above assessment and plan.  Wadie Lessen, NP  Please contact Palliative Medicine Team phone at [phone] for questions and concerns.

## 2016-01-11 NOTE — Discharge Instructions (Signed)
NPO.  Hospice care.

## 2016-01-11 NOTE — Progress Notes (Signed)
Pt discharged via EMS to hospice home with 5L O2-family present at transport.

## 2016-01-11 NOTE — Progress Notes (Signed)
CSW confirmed with Evelyn Willis- Liaison with [facility] that there is an available bed today for patient. CSW completed discharge packet and EMS form and provided it to [name]. Patient will discharge to [facility] today 01/11/16  Ernest Pine, MSW, [place], [place] Clinical Social Worker [phone]

## 2016-01-11 NOTE — Consult Note (Signed)
   [facility] CM Inpatient Consult   01/11/2016  Evelyn Willis 1956/04/28 [id number]  Patient screened for potential [place] Management services. Patient is eligible for [place]. Electronic medical record reveals patient's discharge plan is Hospice Home. [facility] Care Management services not appropriate at this time. If patient's post hospital needs change please place a [facility] Care Management consult. For questions please contact:   Shonita Rinck RN, [facility] Liaison  [phone]) Business Mobile [phone]) Toll free office

## 2016-01-11 NOTE — Discharge Summary (Signed)
[place] [facility] [place] NAME: Evelyn Willis    MR#:  [id number]  DATE OF BIRTH:  [birth year]  DATE OF ADMISSION:  01/07/2016   ADMITTING PHYSICIAN: Lytle Butte, MD  DATE OF DISCHARGE: 01/11/2016 PRIMARY CARE PHYSICIAN: BABAOFF, Caryl Bis, MD   ADMISSION DIAGNOSIS:  CAP (community acquired pneumonia) [J18.9] Acute respiratory failure with hypoxia ([place]) [J96.01] Severe sepsis ([place]) [A41.9, R65.20] Malignant neoplasm of left lung, unspecified part of lung ([place]) [C34.92] DISCHARGE DIAGNOSIS:  Active Problems:   Acute on chronic respiratory failure with hypoxia (HCC)   Severe sepsis (HCC)   COPD exacerbation (HCC)   Malignant neoplasm of left lung ([place])   DNR (do not resuscitate) discussion   Palliative care encounter  SECONDARY DIAGNOSIS:   Past Medical History:  Diagnosis Date  . Atrial fibrillation ([place])   . Cancer [facility]) 2016   Left lung cancer, radiation, chemo  . COPD (chronic obstructive pulmonary disease) ([place])   . Coronary artery disease    said- stent was placed 10 yrs ago from now( 2017) in [place], [place]  . Depression   . Emphysema lung ([place])   . Hematuria   . Multiple sclerosis ([place])   . 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  * Sepsis Bilateral aspiration pneumonia/HCAP Treated with ZOSYN and vancomycin.  * Acute hypoxic resp failure with HCAP. Continue O2 [place], NEB prn. MRSA PCR: negative. She has been treated with IV steroids.  * Squamous cell carcinoma of left lung Stage 2A no longer options for surgical and radiation treatment per Dr. Grayland Ormond.  *severe high-grade narrowing at the midthoracic  esophagus: This is likely due to external compression from left lung mass. She is on NPO, but not a good candidate for PEG placement due to her chances of even getting through a PEG was likely 50/50 at best.  * History of MS: Continue pain management.  * History of atrial fibrillation: amiodarone was on hold due to NPO.  * Anemia: Continue to monitor hemoglobin. Patient does have chronic anemia.   I discussed with palliative care NP, Gabriel Carina and pt's FM, they agree to discharge pt to hospice home today.  DISCHARGE CONDITIONS:  Poor prognosis, discharge to hospice home today. CONSULTS OBTAINED:  Treatment Team:  Lytle Butte, MD Flora Lipps, MD DRUG ALLERGIES:   Allergies  Allergen Reactions  . Aspirin Other (See Comments)    Pt gets bleeding ulcers when she takes this medication Bleeding ulcers  . Nsaids Other (See Comments)    Other reaction(s): Other (See Comments) Cannot take due to significant peptic ulcer disease. Cannot take due to significant peptic ulcer disease.   DISCHARGE MEDICATIONS:     Medication List    STOP taking these medications   albuterol 108 (90 Base) MCG/ACT inhaler Commonly known as:  PROVENTIL HFA;VENTOLIN HFA   amiodarone 200 MG tablet Commonly known as:  PACERONE   amitriptyline 75 MG tablet Commonly known as:  ELAVIL   amphetamine-dextroamphetamine 20 MG tablet Commonly known as:  ADDERALL   buPROPion 150 MG 24 hr tablet Commonly known as:  WELLBUTRIN XL   docusate sodium 100 MG capsule Commonly known as:  COLACE  FIRST-DUKES MOUTHWASH Susp   fludrocortisone 0.1 MG tablet Commonly known as:  FLORINEF   HYDROcodone-acetaminophen 10-325 MG tablet Commonly known as:  NORCO   hydrocortisone 20 MG tablet Commonly known as:  CORTEF   lidocaine-prilocaine cream Commonly known as:  EMLA   megestrol 400 MG/10ML suspension Commonly known as:  MEGACE   metoprolol succinate 25 MG 24 hr tablet Commonly known as:   TOPROL-XL   mirtazapine 15 MG tablet Commonly known as:  REMERON   morphine 15 MG 12 hr tablet Commonly known as:  MS CONTIN Replaced by:  morphine 2 MG/ML injection   NARCAN NA   nicotine 21 mg/24hr patch Commonly known as:  NICODERM CQ - dosed in mg/24 hours   tiotropium 18 MCG inhalation capsule Commonly known as:  SPIRIVA     TAKE these medications   bisacodyl 10 MG suppository Commonly known as:  DULCOLAX Place 1 suppository (10 mg total) rectally daily as needed for moderate constipation.   glycopyrrolate 0.2 MG/ML injection Commonly known as:  ROBINUL Inject 1 mL (0.2 mg total) into the vein 4 (four) times daily.   ipratropium-albuterol 0.5-2.5 (3) MG/3ML Soln Commonly known as:  DUONEB Take 3 mLs by nebulization every 4 (four) hours.   LORazepam 2 MG/ML injection Commonly known as:  ATIVAN Inject 0.5 mLs (1 mg total) into the vein every 4 (four) hours as needed for anxiety.   menthol-cetylpyridinium 3 MG lozenge Commonly known as:  CEPACOL Take 1 lozenge (3 mg total) by mouth as needed for sore throat.   morphine 2 MG/ML injection Inject 1 mL (2 mg total) into the vein every 2 (two) hours as needed (dyspnea). Replaces:  morphine 15 MG 12 hr tablet   ondansetron 4 MG/2ML Soln injection Commonly known as:  ZOFRAN Inject 2 mLs (4 mg total) into the vein every 6 (six) hours as needed for nausea, vomiting or refractory nausea / vomiting.   phenol 1.4 % Liqd Commonly known as:  CHLORASEPTIC Use as directed 1 spray in the mouth or throat as needed for throat irritation / pain.        DISCHARGE INSTRUCTIONS:   DIET:  NPO DISCHARGE CONDITION:  Poor. ACTIVITY:  As tolerated. OXYGEN:  Home Oxygen: 4-5 L [place] DISCHARGE LOCATION:   If you experience worsening of your admission symptoms, develop shortness of breath, life threatening emergency, suicidal or homicidal thoughts you must seek medical attention immediately by calling 911 or calling your MD  immediately  if symptoms less severe.  You Must read complete instructions/literature along with all the possible adverse reactions/side effects for all the Medicines you take and that have been prescribed to you. Take any new Medicines after you have completely understood and accpet all the possible adverse reactions/side effects.   Please note  You were cared for by a hospitalist during your hospital stay. If you have any questions about your discharge medications or the care you received while you were in the hospital after you are discharged, you can call the unit and asked to speak with the hospitalist on call if the hospitalist that took care of you is not available. Once you are discharged, your primary care physician will handle any further medical issues. Please note that NO REFILLS for any discharge medications will be authorized once you are discharged, as it is imperative that you return to your primary care physician (or establish a relationship with a primary care physician if you do not have one) for your aftercare needs so  that they can reassess your need for medications and monitor your lab values.    On the day of Discharge:  VITAL SIGNS:  Blood pressure 129/79, pulse (!) 112, temperature 98.4 F (36.9 C), temperature source Oral, resp. rate 18, height '5\' 6"'$  (1.676 m), weight 106 lb 12.8 oz (48.4 kg), SpO2 98 %. PHYSICAL EXAMINATION:  GENERAL:  60 y.o.-year-old patient appears cachectic. EYES:  No scleral icterus. Extraocular muscles intact.  HEENT: Head atraumatic, normocephalic.  NECK:  Supple, no jugular venous distention. No thyroid enlargement, no tenderness.  LUNGS: diminished breath sounds bilaterally, has crackles and rhonchi. No use of accessory muscles of respiration.  CARDIOVASCULAR: S1, S2 tachycardia. No murmurs, rubs, or gallops.  ABDOMEN: Soft, non-tender, non-distended. Bowel sounds present. No organomegaly or mass.  EXTREMITIES: No pedal edema, cyanosis, or  clubbing.  NEUROLOGIC: Cranial nerves II through XII are intact. Muscle strength 4/5 in all extremities. Sensation intact. Gait not checked.  PSYCHIATRIC: The patient is alert and oriented x 3.  SKIN: No obvious rash, lesion, or ulcer.  DATA REVIEW:   CBC  Recent Labs Lab 01/09/16 0424  WBC 22.2*  HGB 8.7*  HCT 27.5*  PLT 546*    Chemistries   Recent Labs Lab 01/07/16 1743  01/09/16 0424  NA 139  < > 140  K 4.0  < > 4.1  CL 104  < > 110  CO2 24  < > 24  GLUCOSE 105*  < > 128*  BUN 17  < > 14  CREATININE 0.59  < > 0.48  CALCIUM 9.1  < > 8.7*  MG 1.9  --   --   AST 24  --   --   ALT 27  --   --   ALKPHOS 103  --   --   BILITOT 0.6  --   --   < > = values in this interval not displayed.   Microbiology Results  Results for orders placed or performed during the hospital encounter of 01/07/16  Blood Culture (routine x 2)     Status: None (Preliminary result)   Collection Time: 01/07/16  6:47 PM  Result Value Ref Range Status   Specimen Description BLOOD LEFT ARM  Final   Special Requests   Final    BOTTLES DRAWN AEROBIC AND ANAEROBIC 12CCAERO,15CCANA   Culture NO GROWTH 3 DAYS  Final   Report Status PENDING  Incomplete  Blood Culture (routine x 2)     Status: None (Preliminary result)   Collection Time: 01/07/16  6:47 PM  Result Value Ref Range Status   Specimen Description BLOOD RIGHT ARM  Final   Special Requests   Final    BOTTLES DRAWN AEROBIC AND ANAEROBIC 10CCAERO,12CCAERO   Culture NO GROWTH 3 DAYS  Final   Report Status PENDING  Incomplete  MRSA PCR Screening     Status: None   Collection Time: 01/09/16 10:18 PM  Result Value Ref Range Status   MRSA by PCR NEGATIVE NEGATIVE Final    Comment:        The GeneXpert MRSA Assay (FDA approved for NASAL specimens only), is one component of a comprehensive MRSA colonization surveillance program. It is not intended to diagnose MRSA infection nor to guide or monitor treatment for MRSA infections.      RADIOLOGY:  No results found.   Management plans discussed with the patient, her dauhghter and they are in agreement.  CODE STATUS:     Code Status Orders  Start     Ordered   01/10/16 1022  Do not attempt resuscitation (DNR)  Continuous    Question Answer Comment  In the event of cardiac or respiratory ARREST Do not call a "code blue"   In the event of cardiac or respiratory ARREST Do not perform Intubation, CPR, defibrillation or ACLS   In the event of cardiac or respiratory ARREST Use medication by any route, position, wound care, and other measures to relive pain and suffering. May use oxygen, suction and manual treatment of airway obstruction as needed for comfort.      01/10/16 1022    Code Status History    Date Active Date Inactive Code Status Order ID Comments User Context   01/08/2016 11:13 AM 01/10/2016 10:22 AM Full Code [id number]  Bettey Costa, MD Inpatient   01/08/2016 10:26 AM 01/08/2016 11:13 AM DNR [id number]  Bettey Costa, MD Inpatient   01/07/2016  6:43 PM 01/08/2016 10:26 AM Full Code [id number]  Lytle Butte, MD ED   10/23/2015  3:19 AM 10/25/2015  2:02 PM Full Code [id number]  Saundra Shelling, MD Inpatient   08/06/2015 11:33 AM 08/07/2015  8:44 PM Full Code [id number]  Vaughan Basta, MD Inpatient      TOTAL TIME TAKING CARE OF THIS PATIENT:48 minutes.    Demetrios Loll M.D on 01/11/2016 at 10:02 AM  Between 7am to 6pm - Pager - [phone]  After 6pm go to [email or web address] - Proofreader  [facility]  Office  [phone]  CC: Primary care physician; BABAOFF, Caryl Bis, MD   Note: This dictation was prepared with Dragon dictation along with smaller phrase technology. Any transcriptional errors that result from this process are unintentional.

## 2016-01-11 NOTE — Consult Note (Signed)
Patient ABG showed pO2 of 51 on oxygen.  She is not a candidate for a PEG due to severe lung disease.  She will be going to Hospice which is a good choice. Given her overall condition.

## 2016-01-11 NOTE — Progress Notes (Addendum)
[facility]   PATIENT NAME: Evelyn Willis    MR#:  [id number]  DATE OF BIRTH:  Jul 24, 1955   DATE OF ADMISSION:  01/07/2016   REQUESTING/REFERRING PHYSICIAN: Mody CHIEF COMPLAINT:   Chief Complaint  Patient presents with  . Shortness of Breath  Increased WOB and cough  HISTORY OF PRESENT ILLNESS:  Margia Wiesen  is a 60 y.o. female with a known history of Lung cancer Stage 2A,  - on active chemo treatment presenting with shortness of breath and cough.  -She states 2 day duration of continuing cough with associated posttussive emesis.  -Cough productive green sputum denies fevers chills positive fatigue and weakness. -code sepsis was started in ER -PFT from 11/04/15, FEV1 51%, air trapping, DLCO equals 51%, consistent with moderate COPD, on the cusp of becoming severe.  PAST MEDICAL HISTORY:   Past Medical History:  Diagnosis Date  . Atrial fibrillation ([place])   . Cancer [facility]) 2016   Left lung cancer, radiation, chemo  . COPD (chronic obstructive pulmonary disease) ([place])   . Coronary artery disease    said- stent was placed 10 yrs ago from now( 2017) in [place], [place]  . Depression   . Emphysema lung ([place])   . Hematuria   . Multiple sclerosis ([place])   . Renal calculi    SUBJECTIVE: Pt currently on 4L O2 via nasal canula no acute distress. She continues to have a frequent productive cough and pain.   REVIEW OF SYSTEMS:  REVIEW OF SYSTEMS: Positives in BOLD CONSTITUTIONAL: Denies fevers, chills, Positive fatigue, weakness. Positive weight loss EYES: Denies blurred vision, double vision, or eye pain.  EARS, NOSE, THROAT: Denies tinnitus, ear pain, hearing loss.  RESPIRATORY: Positive cough, +shortness of breath, +wheezing  CARDIOVASCULAR: Denies chest pain, palpitations, edema.  GASTROINTESTINAL: Denies nausea, vomiting, diarrhea, abdominal pain.  GENITOURINARY: Denies dysuria, hematuria.  ENDOCRINE: Denies nocturia or thyroid problems. HEMATOLOGIC AND  LYMPHATIC: Denies easy bruising or bleeding.  SKIN: Denies rash or lesions.  MUSCULOSKELETAL: Denies pain in neck, back, shoulder, knees, hips, or further arthritic symptoms.  NEUROLOGIC: Denies paralysis, paresthesias.  PSYCHIATRIC: Denies anxiety or depressive symptoms.  VITAL SIGNS:  Blood pressure 129/79, pulse (!) 112, temperature 98.4 F (36.9 C), temperature source Oral, resp. rate 18, height '5\' 6"'$  (1.676 m), weight 48.4 kg (106 lb 12.8 oz), SpO2 98 %.   GENERAL: chronically ill frail appearing female  NEURO: alert and oriented, follows commands HEAD: Normocephalic, atraumatic.  EAR, NOSE, THROAT: Clear without exudates. No external lesions.  NECK: Supple, No JVD PULMONARY: rhonchi throughout,  even non labored CARDIOVASCULAR: s1s2, rrr, no M/R/G, no edema, pedal pulses 2+ bilaterally.  GASTROINTESTINAL: hypoactive BSx4, Soft, nontender, non distended, no masses MUSCULOSKELETAL: frail appearing, full ROM SKIN: No ulceration, lesions, rashes, or cyanosis. Skin warm and dry. Turgor intact.   IMPRESSION AND PLAN:   60 year old Caucasian female history of Stage 2A lung cancer admitted for pneumonia with sepsis and COPD exacerbation  Plan Plans for discharge to hospice home today 8/16 spoke with family and pt they are in agreement with this plan due to potential of recurrence of aspiration pneumonia and continued downward course given advancing lung cancer.   Continue supplemental O2 as needed for pt comfort.   Continue PNA treatment Continue steroids  Marda Stalker, [place] Pager [phone] (please enter 7 digits) PCCM Consult Pager [phone] (please enter 7 digits)      STAFF NOTE: I, Dr. Vilinda Boehringer have personally reviewed patient's available data, including medical  history, events of note, physical examination and test results as part of my evaluation. I have discussed with NP Demaris Callander  and other care providers such as  pharmacist, RN and RRT.    Recurrent aspiration PNA - cnt with current treatment Cont with steroids.   .  Rest per NP/medical resident whose note is outlined above and that I agree with  Pulmonary Care Time devoted to patient care services described in this note is   Minutes.   Thank you for consulting [facility], we will signoff at this time.  Please feel free to contact us with any questions at [phone] (please enter 7-digits).     This time reflects time of care of this signee Dr Vilinda Boehringer.  This critical care time does not reflect procedure time, or teaching time or supervisory time of PA/NP/Med-student/Med Resident etc but could involve care discussion time.  Vilinda Boehringer, MD [facility] Pager [phone] (please enter 7-digits) On Call Pager [phone] (please enter 7-digits)  Note: This note was prepared with Dragon dictation along with smaller phrase technology. Any transcriptional errors that result from this process are unintentional.

## 2016-01-11 NOTE — Progress Notes (Signed)
[facility] referral received from [place]. Ms. Bibian is a 60 year old woman with a known history of squamous cell carcinoma of the left lung who was admitted on 8/12 with bilateral pneumonia. She presented with several days of worsening cough and increased shortness of breath and met sespis criteria with an elevated white count, heart rate and respiratory rate. She has a PMH that includes: Afib, COPD, depression, CAD w/stent placement, multiple sclerosis and kidney stones.  She has been treated with IV antibiotics and fluids, Oncology and GI have been consulted as she is unable to tolerate any oral intake. She has continued to decline and is requiring PRN IV morphine and dilaudid for management of dyspnea and IV lorazepam for anxiety. Palliative Medicine NP mary Davis Gourd has had several meetings with patient and her family and they have decided to focus on comfort and symptom management at the hospice home. Patient seen lying in bed, just having received IV dilaudid for dyspnea, appeared to be sleeping, family at bedside and requested to meet outside of the room. Writer met in the family room with patient's daughter Gwinda Passe and brother in law Ronalee Belts to initiate education regarding hospice services, philosophy and team approach to care with good understanding voiced. Questions answered, consents signed.Plan is for patient to be started on a continuous morphine drip at the hospice home, family made aware and in agreement.  Patient information faxed to hospice referral. Report called to the hospice home. EMS notified for transport. Hospital care team all aware. Signed portable DNR in place in patient's discharge packet. Thank you for the opportunity to be involved in the care of this patient and her family. Flo Shanks RN, BSN, [facility] [place], hospital liaison [phone] c

## 2016-01-12 LAB — CULTURE, BLOOD (ROUTINE X 2)
CULTURE: NO GROWTH
CULTURE: NO GROWTH

## 2016-01-19 ENCOUNTER — Inpatient Hospital Stay: Payer: PPO

## 2016-01-19 ENCOUNTER — Inpatient Hospital Stay: Payer: PPO | Admitting: Oncology

## 2016-01-27 ENCOUNTER — Inpatient Hospital Stay: Payer: Self-pay | Admitting: Internal Medicine

## 2016-01-27 DEATH — deceased

## 2016-02-13 ENCOUNTER — Ambulatory Visit: Payer: Self-pay | Admitting: Internal Medicine

## 2016-02-17 ENCOUNTER — Ambulatory Visit: Payer: Self-pay | Admitting: Cardiovascular Disease

## 2016-05-11 ENCOUNTER — Other Ambulatory Visit: Payer: Self-pay | Admitting: Nurse Practitioner

## 2018-01-13 IMAGING — RF DG ESOPHAGUS
6 series · 20 of 20 positions shown · non-contrast
Comparison: None.

CLINICAL DATA: Pt states feels like swallowing air, trouble
swallowing solids AND liquids x 4 weeks, pt currently being treated
for lung cancer, smoker x 45 years.

EXAM:
ESOPHOGRAM / BARIUM SWALLOW
TECHNIQUE: Combined double contrast and single contrast examination performed
using effervescent crystals, thick barium liquid.
FLUOROSCOPY TIME:  Radiation Exposure Index (as provided by the
fluoroscopic device): 2.7 mGy

[Series 1: cp_standard · 0.51mm/px · 4 of 56 frames shown (1 of 6)]
[frame 9/56]
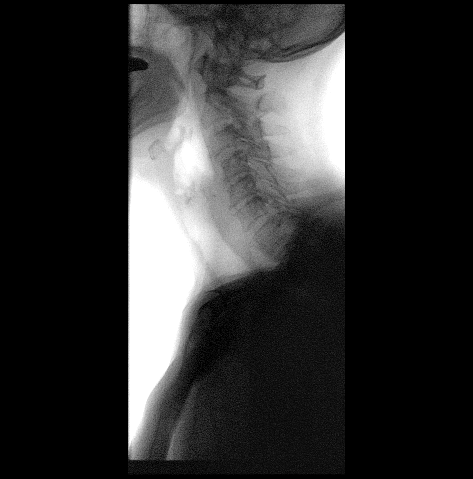
[frame 18/56]
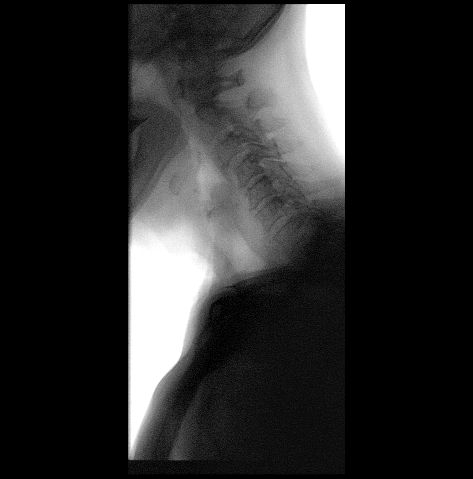
[frame 29/56]
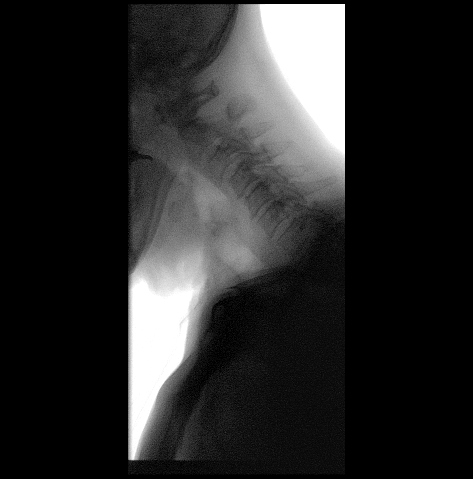
[frame 48/56]
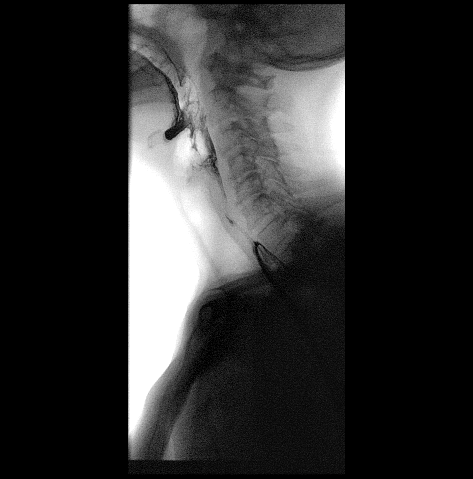

[Series 2: cp_standard · 0.51mm/px · 4 of 129 frames shown (2 of 6)]
[frame 20/129]
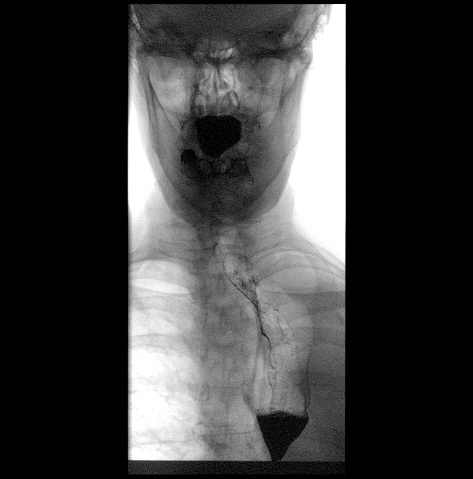
[frame 65/129]
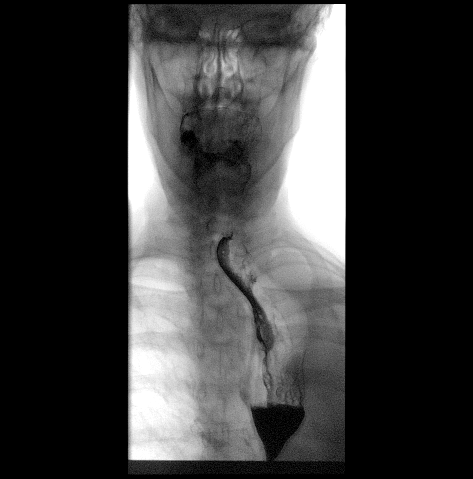
[frame 92/129]
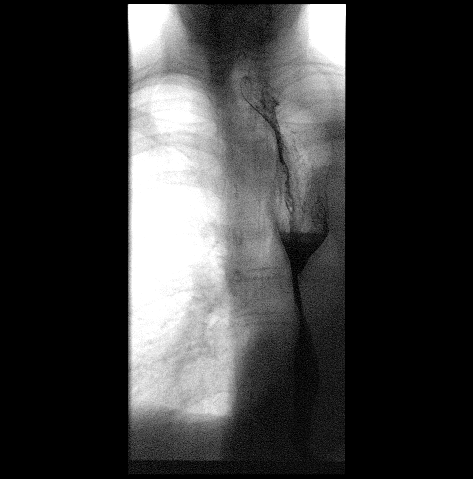
[frame 110/129]
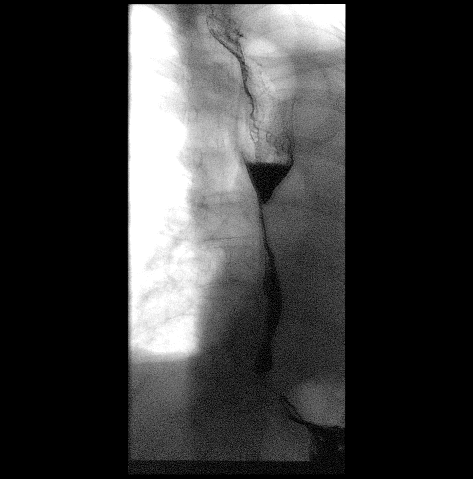

[Series 3: cp_standard · 0.26mm/px · 1 of 1 slices shown (3 of 6)]
[im 1/1]
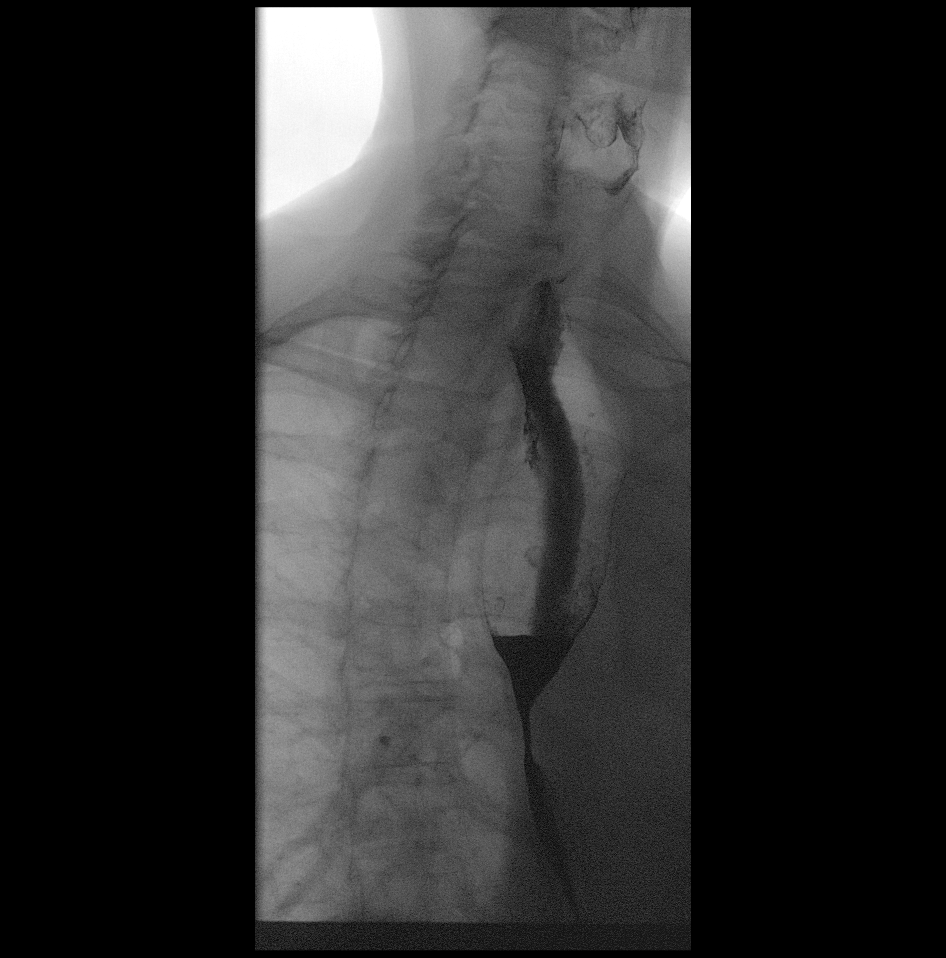

[Series 4: cp_standard · 0.51mm/px · 3 of 54 frames shown (4 of 6)]
[frame 9/54]
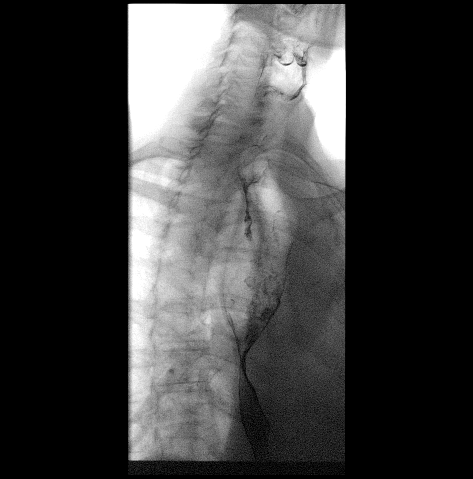
[frame 28/54]
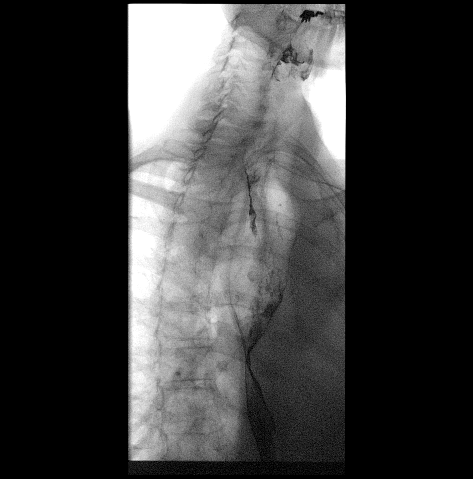
[frame 46/54]
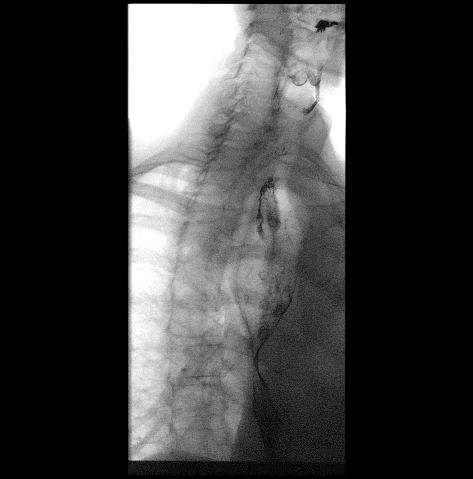

[Series 5: cp_standard · 0.51mm/px · 4 of 19 frames shown (5 of 6)]
[frame 3/19]
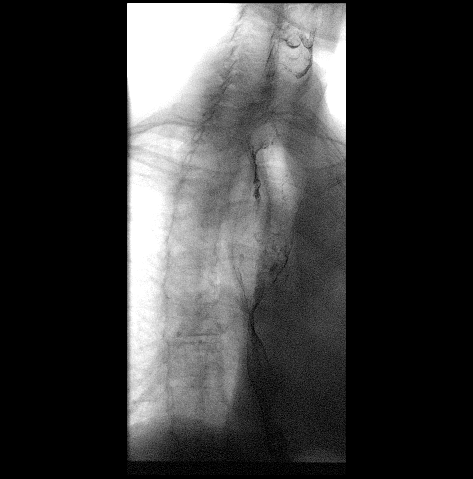
[frame 6/19]
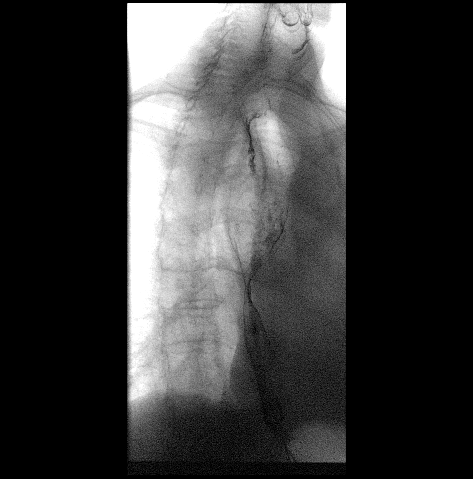
[frame 10/19]
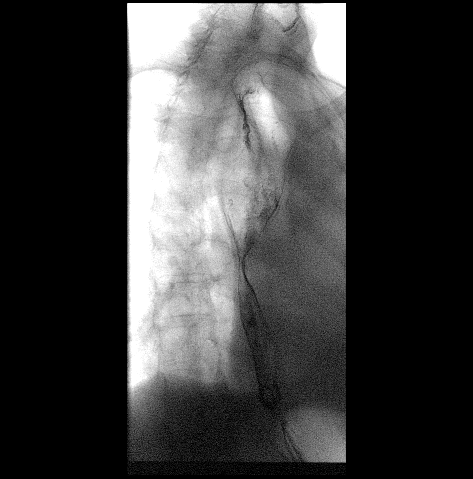
[frame 17/19]
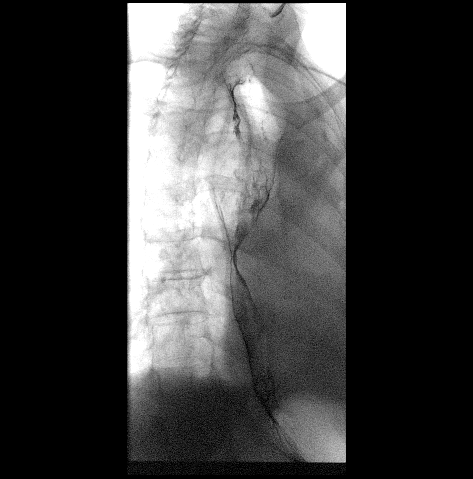

[Series 6: cp_standard · 0.51mm/px · 4 of 53 frames shown (6 of 6)]
[frame 5/53]
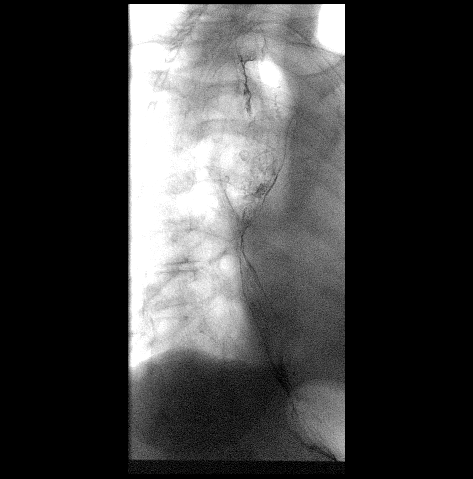
[frame 8/53]
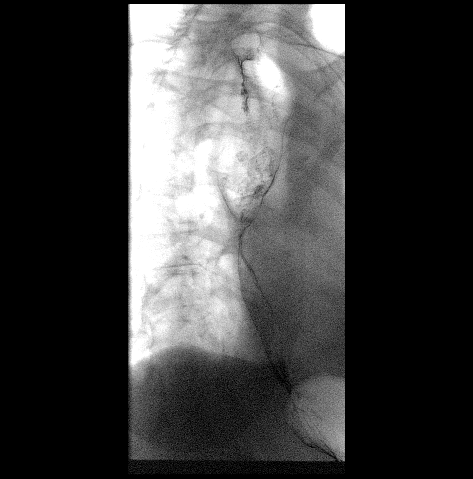
[frame 27/53]
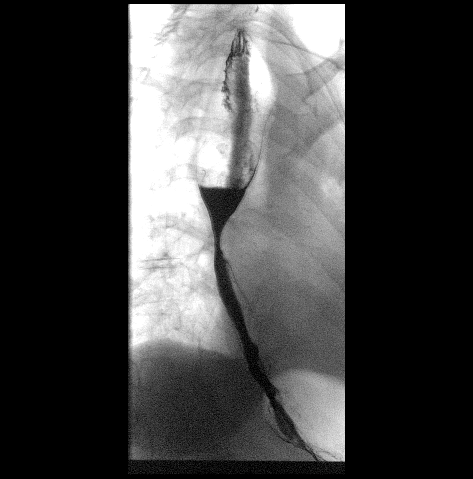
[frame 46/53]
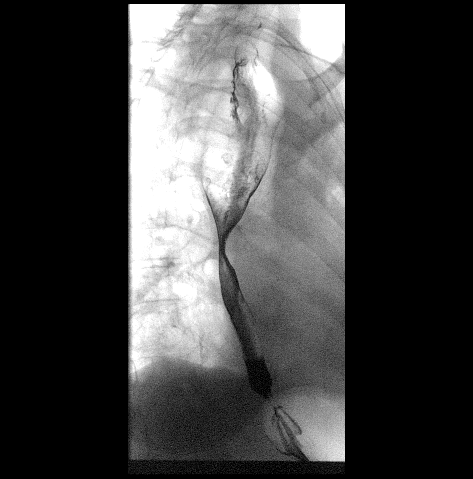

[20 of 20 positions shown; findings below may reference images not displayed]

FINDINGS: There was normal pharyngeal anatomy and motility. Severe high-grade
narrowing of the mid thoracic esophagus in the subcarinal region.
There is delayed transit of liquid barium through the stricture
without complete obstruction. Left lung collapse with near complete
opacification of the left lung.
IMPRESSION: 1. Severe high-grade narrowing of the mid thoracic esophagus in the
subcarinal region. Left lung collapse with near complete
opacification of the left lung.
